# Patient Record
Sex: Male | Born: 1975 | Race: White | Hispanic: No | Marital: Single | Smoking: Current every day smoker
Health system: Southern US, Community
[De-identification: ages and names within clinical notes are randomized; demographics above are authoritative.]

## PROBLEM LIST (undated history)

## (undated) DIAGNOSIS — K759 Inflammatory liver disease, unspecified: Secondary | ICD-10-CM

## (undated) DIAGNOSIS — E119 Type 2 diabetes mellitus without complications: Secondary | ICD-10-CM

## (undated) DIAGNOSIS — R768 Other specified abnormal immunological findings in serum: Secondary | ICD-10-CM

## (undated) DIAGNOSIS — E7800 Pure hypercholesterolemia, unspecified: Secondary | ICD-10-CM

## (undated) DIAGNOSIS — I10 Essential (primary) hypertension: Secondary | ICD-10-CM

## (undated) DIAGNOSIS — K219 Gastro-esophageal reflux disease without esophagitis: Secondary | ICD-10-CM

## (undated) HISTORY — PX: FOOT SURGERY: SHX648

## (undated) HISTORY — DX: Type 2 diabetes mellitus without complications: E11.9

## (undated) HISTORY — PX: NO PAST SURGERIES: SHX2092

## (undated) HISTORY — DX: Other specified abnormal immunological findings in serum: R76.8

## (undated) HISTORY — DX: Essential (primary) hypertension: I10

## (undated) HISTORY — DX: Pure hypercholesterolemia, unspecified: E78.00

---

## 2005-10-22 ENCOUNTER — Emergency Department (HOSPITAL_COMMUNITY): Admission: EM | Admit: 2005-10-22 | Discharge: 2005-10-22 | Payer: Self-pay | Admitting: Emergency Medicine

## 2010-03-17 ENCOUNTER — Emergency Department (HOSPITAL_COMMUNITY)
Admission: EM | Admit: 2010-03-17 | Discharge: 2010-03-17 | Disposition: A | Discharge status: Home / Self Care | Payer: Self-pay | Attending: Emergency Medicine | Admitting: Emergency Medicine

## 2010-03-17 ENCOUNTER — Emergency Department (HOSPITAL_COMMUNITY): Payer: Self-pay

## 2010-03-17 DIAGNOSIS — J209 Acute bronchitis, unspecified: Secondary | ICD-10-CM | POA: Insufficient documentation

## 2010-03-17 DIAGNOSIS — R042 Hemoptysis: Secondary | ICD-10-CM | POA: Insufficient documentation

## 2012-06-03 ENCOUNTER — Emergency Department (HOSPITAL_COMMUNITY)
Admission: EM | Admit: 2012-06-03 | Discharge: 2012-06-03 | Disposition: A | Discharge status: Home / Self Care | Payer: Self-pay | Attending: Emergency Medicine | Admitting: Emergency Medicine

## 2012-06-03 ENCOUNTER — Emergency Department (HOSPITAL_COMMUNITY): Payer: Self-pay

## 2012-06-03 ENCOUNTER — Encounter (HOSPITAL_COMMUNITY): Payer: Self-pay

## 2012-06-03 DIAGNOSIS — Z79899 Other long term (current) drug therapy: Secondary | ICD-10-CM | POA: Insufficient documentation

## 2012-06-03 DIAGNOSIS — R1033 Periumbilical pain: Secondary | ICD-10-CM | POA: Insufficient documentation

## 2012-06-03 DIAGNOSIS — I10 Essential (primary) hypertension: Secondary | ICD-10-CM | POA: Insufficient documentation

## 2012-06-03 DIAGNOSIS — R109 Unspecified abdominal pain: Secondary | ICD-10-CM

## 2012-06-03 DIAGNOSIS — R630 Anorexia: Secondary | ICD-10-CM | POA: Insufficient documentation

## 2012-06-03 DIAGNOSIS — R110 Nausea: Secondary | ICD-10-CM | POA: Insufficient documentation

## 2012-06-03 LAB — CBC WITH DIFFERENTIAL/PLATELET
Basophils Absolute: 0 10*3/uL (ref 0.0–0.1)
Basophils Relative: 0 % (ref 0–1)
HCT: 45.5 % (ref 39.0–52.0)
Hemoglobin: 15.8 g/dL (ref 13.0–17.0)
Lymphocytes Relative: 24 % (ref 12–46)
MCHC: 34.7 g/dL (ref 30.0–36.0)
Monocytes Absolute: 0.6 10*3/uL (ref 0.1–1.0)
Monocytes Relative: 5 % (ref 3–12)
Neutro Abs: 8.2 10*3/uL — ABNORMAL HIGH (ref 1.7–7.7)
Neutrophils Relative %: 69 % (ref 43–77)
RBC: 5.28 MIL/uL (ref 4.22–5.81)
WBC: 11.9 10*3/uL — ABNORMAL HIGH (ref 4.0–10.5)

## 2012-06-03 LAB — URINALYSIS, ROUTINE W REFLEX MICROSCOPIC
Bilirubin Urine: NEGATIVE
Glucose, UA: NEGATIVE mg/dL
Ketones, ur: NEGATIVE mg/dL
Protein, ur: NEGATIVE mg/dL
pH: 8 (ref 5.0–8.0)

## 2012-06-03 LAB — COMPREHENSIVE METABOLIC PANEL
AST: 31 U/L (ref 0–37)
Albumin: 4.2 g/dL (ref 3.5–5.2)
Alkaline Phosphatase: 64 U/L (ref 39–117)
BUN: 13 mg/dL (ref 6–23)
CO2: 27 mEq/L (ref 19–32)
Chloride: 99 mEq/L (ref 96–112)
GFR calc non Af Amer: >90 mL/min (ref >90)
Potassium: 3.6 mEq/L (ref 3.5–5.1)
Total Bilirubin: 0.5 mg/dL (ref 0.3–1.2)

## 2012-06-03 MED ORDER — IOHEXOL 300 MG/ML  SOLN
100.0000 mL | Freq: Once | INTRAMUSCULAR | Status: AC | PRN
  Administered 2012-06-03: 100 mL via INTRAVENOUS

## 2012-06-03 MED ORDER — ONDANSETRON HCL 4 MG/2ML IJ SOLN
4.0000 mg | Freq: Once | INTRAMUSCULAR | Status: AC
  Administered 2012-06-03: 4 mg via INTRAVENOUS
  Filled 2012-06-03: qty 2

## 2012-06-03 MED ORDER — SODIUM CHLORIDE 0.9 % IV BOLUS (SEPSIS)
1000.0000 mL | Freq: Once | INTRAVENOUS | Status: AC
  Administered 2012-06-03: 1000 mL via INTRAVENOUS

## 2012-06-03 MED ORDER — MORPHINE SULFATE 4 MG/ML IJ SOLN
4.0000 mg | Freq: Once | INTRAMUSCULAR | Status: AC
  Administered 2012-06-03: 4 mg via INTRAVENOUS
  Filled 2012-06-03: qty 1

## 2012-06-03 MED ORDER — IOHEXOL 300 MG/ML  SOLN
50.0000 mL | Freq: Once | INTRAMUSCULAR | Status: AC | PRN
  Administered 2012-06-03: 50 mL via ORAL

## 2012-06-03 NOTE — ED Provider Notes (Signed)
History     CSN: [id number]  Arrival date & time 06/03/12  1329   First MD Initiated Contact with Patient 06/03/12 1338      Chief Complaint  Patient presents with  . Abdominal Pain    (Consider location/radiation/quality/duration/timing/severity/associated sxs/prior treatment) HPI Comments: Patient presents with lower abdominal pain that started yesterday. It is periumbilical and radiates to the right lower quadrant. He became worse overnight. It is constant and associated with anorexia nausea. No vomiting. No change in bowel habits. No dysuria hematuria or testicular pain. No back pain. No previous medical history. No previous surgeries. Good By mouth intake and urine output. Nothing makes the pain better. Palpation makes it worse.  The history is provided by the patient.    History reviewed. No pertinent past medical history.  History reviewed. No pertinent past surgical history.  No family history on file.  History  Substance Use Topics  . Smoking status: Current Every Day Smoker  . Smokeless tobacco: Not on file  . Alcohol Use: No      Review of Systems  Constitutional: Positive for activity change and appetite change. Negative for fever.  HENT: Negative for congestion and rhinorrhea.   Respiratory: Negative for cough, chest tightness and shortness of breath.   Cardiovascular: Negative for chest pain.  Gastrointestinal: Positive for nausea and abdominal pain. Negative for vomiting.  Genitourinary: Negative for dysuria, hematuria and testicular pain.  Musculoskeletal: Negative for back pain.  Skin: Negative for rash.  Neurological: Negative for dizziness, weakness and headaches.  A complete 10 system review of systems was obtained and all systems are negative except as noted in the HPI and PMH.    Allergies  K-dur and Tamiflu  Home Medications   Current Outpatient Rx  Name  Route  Sig  Dispense  Refill  . acetaminophen (TYLENOL) 500 MG tablet   Oral   Take  1,000 mg by mouth every 6 (six) hours as needed for pain.         . cetirizine (ZYRTEC) 10 MG tablet   Oral   Take 10 mg by mouth daily.         Marland Kitchen ibuprofen (ADVIL,MOTRIN) 200 MG tablet   Oral   Take 400 mg by mouth every 6 (six) hours as needed for pain.         Marland Kitchen omeprazole (PRILOSEC OTC) 20 MG tablet   Oral   Take 20 mg by mouth daily.           BP 159/111  Pulse 87  Temp(Src) 98.6 F (37 C) (Oral)  Resp 18  Ht 6\' 2"  (1.88 m)  Wt 240 lb (108.863 kg)  BMI 30.8 kg/m2  SpO2 100%  Physical Exam  Constitutional: He is oriented to person, place, and time. He appears well-developed and well-nourished. No distress.  HENT:  Head: Normocephalic and atraumatic.  Mouth/Throat: Oropharynx is clear and moist. No oropharyngeal exudate.  Eyes: Conjunctivae and EOM are normal. Pupils are equal, round, and reactive to light.  Neck: Normal range of motion. Neck supple.  Cardiovascular: Normal rate, regular rhythm and normal heart sounds.   No murmur heard. Pulmonary/Chest: Effort normal and breath sounds normal. No respiratory distress.  Abdominal: Soft. There is tenderness. There is no rebound and no guarding.  periumbilical Right lower quadrant tenderness without guarding or rebound.  Genitourinary:  No testicular tenderness  Musculoskeletal: Normal range of motion. He exhibits no edema and no tenderness.  Neurological: He is alert and oriented to person,  place, and time. No cranial nerve deficit. He exhibits normal muscle tone. Coordination normal.  Skin: Skin is warm.    ED Course  Procedures (including critical care time)  Labs Reviewed  CBC WITH DIFFERENTIAL - Abnormal; Notable for the following:    WBC 11.9 (*)    Neutro Abs 8.2 (*)    All other components within normal limits  COMPREHENSIVE METABOLIC PANEL - Abnormal; Notable for the following:    Glucose, Bld 155 (*)    All other components within normal limits  LIPASE, BLOOD  URINALYSIS, ROUTINE W REFLEX  MICROSCOPIC   Ct Abdomen Pelvis W Contrast  06/03/2012   *RADIOLOGY REPORT*  Clinical Data: Right lower quadrant pain, past history of hernia repair  CT ABDOMEN AND PELVIS WITH CONTRAST  Technique:  Multidetector CT imaging of the abdomen and pelvis was performed following the standard protocol during bolus administration of intravenous contrast. Sagittal and coronal MPR images reconstructed from axial data set.  Contrast: 50mL OMNIPAQUE IOHEXOL 300 MG/ML  SOLN, OMNIPAQUE IOHEXOL 300 MG/ML  SOLN IV  Comparison: None  Findings: Lung bases clear. Mild diffuse fatty infiltration of liver. Liver, spleen, pancreas, kidneys, and adrenal glands otherwise normal. Normal appendix. Stomach and bowel loops normal appearance. No mass, adenopathy, free fluid inflammatory process. Unremarkable bladder and ureters. Tiny umbilical hernia containing fat. No additional hernia or acute bony lesion.  IMPRESSION: No acute intra-abdominal or intrapelvic abnormalities. Tiny umbilical hernia containing fat. Fatty infiltration of liver.   Original Report Authenticated By: Ulyses Southward, M.D.     1. Abdominal pain       MDM  2 day history of lower abdominal pain in the right lower quadrant. Associated with nausea and anorexia.  Obtain labs, CT to rule out appendicitis. Mild leukocytosis.  CT scan shows normal appendix. Tiny umbilical hernia with fat.  UA negative.  Tolerating PO.   Will give list of PCP for referral. Will need hypertension evaluated.      Glynn Octave, MD 06/03/12 [phone]

## 2012-06-03 NOTE — ED Notes (Signed)
Complain of lower abdominal pain . Last BM this morning

## 2012-06-03 NOTE — ED Notes (Signed)
Pt c/o RLQ pain since last night. Pt states pain was worse this morning when he woke up. Pt denies NVD, SOB, chest pain.

## 2014-09-30 ENCOUNTER — Emergency Department (HOSPITAL_COMMUNITY)
Admission: EM | Admit: 2014-09-30 | Discharge: 2014-09-30 | Disposition: A | Discharge status: Home / Self Care | Payer: Self-pay | Attending: Emergency Medicine | Admitting: Emergency Medicine

## 2014-09-30 ENCOUNTER — Emergency Department (HOSPITAL_COMMUNITY): Payer: Self-pay

## 2014-09-30 ENCOUNTER — Encounter (HOSPITAL_COMMUNITY): Payer: Self-pay | Admitting: Emergency Medicine

## 2014-09-30 DIAGNOSIS — Y9289 Other specified places as the place of occurrence of the external cause: Secondary | ICD-10-CM | POA: Insufficient documentation

## 2014-09-30 DIAGNOSIS — R739 Hyperglycemia, unspecified: Secondary | ICD-10-CM

## 2014-09-30 DIAGNOSIS — Z79899 Other long term (current) drug therapy: Secondary | ICD-10-CM | POA: Insufficient documentation

## 2014-09-30 DIAGNOSIS — Y9389 Activity, other specified: Secondary | ICD-10-CM | POA: Insufficient documentation

## 2014-09-30 DIAGNOSIS — Y998 Other external cause status: Secondary | ICD-10-CM | POA: Insufficient documentation

## 2014-09-30 DIAGNOSIS — S39012A Strain of muscle, fascia and tendon of lower back, initial encounter: Secondary | ICD-10-CM | POA: Insufficient documentation

## 2014-09-30 DIAGNOSIS — E1165 Type 2 diabetes mellitus with hyperglycemia: Secondary | ICD-10-CM | POA: Insufficient documentation

## 2014-09-30 DIAGNOSIS — I10 Essential (primary) hypertension: Secondary | ICD-10-CM | POA: Insufficient documentation

## 2014-09-30 DIAGNOSIS — Z720 Tobacco use: Secondary | ICD-10-CM | POA: Insufficient documentation

## 2014-09-30 DIAGNOSIS — S9032XA Contusion of left foot, initial encounter: Secondary | ICD-10-CM | POA: Insufficient documentation

## 2014-09-30 DIAGNOSIS — X58XXXA Exposure to other specified factors, initial encounter: Secondary | ICD-10-CM | POA: Insufficient documentation

## 2014-09-30 DIAGNOSIS — K219 Gastro-esophageal reflux disease without esophagitis: Secondary | ICD-10-CM | POA: Insufficient documentation

## 2014-09-30 DIAGNOSIS — S90122A Contusion of left lesser toe(s) without damage to nail, initial encounter: Secondary | ICD-10-CM

## 2014-09-30 HISTORY — DX: Gastro-esophageal reflux disease without esophagitis: K21.9

## 2014-09-30 LAB — BASIC METABOLIC PANEL
ANION GAP: 10 (ref 5–15)
BUN: 12 mg/dL (ref 6–20)
CO2: 27 mmol/L (ref 22–32)
Calcium: 9 mg/dL (ref 8.9–10.3)
Chloride: 100 mmol/L — ABNORMAL LOW (ref 101–111)
Creatinine, Ser: 1.21 mg/dL (ref 0.61–1.24)
GFR calc Af Amer: >60 mL/min (ref >60)
Glucose, Bld: 402 mg/dL — ABNORMAL HIGH (ref 65–99)
POTASSIUM: 4 mmol/L (ref 3.5–5.1)
SODIUM: 137 mmol/L (ref 135–145)

## 2014-09-30 LAB — CBG MONITORING, ED: GLUCOSE-CAPILLARY: 275 mg/dL — AB (ref 65–99)

## 2014-09-30 MED ORDER — METFORMIN HCL 500 MG PO TABS: 500.0000 mg | ORAL_TABLET | Freq: Two times a day (BID) | ORAL | Status: DC

## 2014-09-30 MED ORDER — AMLODIPINE BESYLATE 5 MG PO TABS
5.0000 mg | ORAL_TABLET | Freq: Once | ORAL | Status: AC
  Administered 2014-09-30: 5 mg via ORAL
  Filled 2014-09-30: qty 1

## 2014-09-30 MED ORDER — AMLODIPINE BESYLATE 10 MG PO TABS: 10.0000 mg | ORAL_TABLET | Freq: Every day | ORAL | Status: DC

## 2014-09-30 MED ORDER — INSULIN ASPART 100 UNIT/ML ~~LOC~~ SOLN
8.0000 [IU] | Freq: Once | SUBCUTANEOUS | Status: AC
  Administered 2014-09-30: 8 [IU] via SUBCUTANEOUS
  Filled 2014-09-30: qty 1

## 2014-09-30 MED ORDER — HYDROCODONE-ACETAMINOPHEN 5-325 MG PO TABS
2.0000 | ORAL_TABLET | Freq: Once | ORAL | Status: AC
  Administered 2014-09-30: 2 via ORAL
  Filled 2014-09-30: qty 2

## 2014-09-30 MED ORDER — HYDROCHLOROTHIAZIDE 25 MG PO TABS: 25.0000 mg | ORAL_TABLET | Freq: Every day | ORAL | Status: DC

## 2014-09-30 MED ORDER — GLIPIZIDE 5 MG PO TABS
5.0000 mg | ORAL_TABLET | Freq: Every day | ORAL | Status: DC
Start: 2014-09-30 — End: 2016-11-27

## 2014-09-30 NOTE — Discharge Instructions (Signed)
It was our pleasure to provide your ER care today - we hope that you feel better.  Drink plenty of water.  Follow diabetic diet.  Take metformin, and glipizide as prescribed.  Follow up with primary care doctor in the coming week - call office Monday to arrange appointment.  For blood pressure, limit salt intake, take blood pressure medications as prescribed, and follow up with primary care doctor in coming week.   You must follow up with primary care doctor in coming week for recheck of blood sugar and blood pressure - if you cannot get in with the primary care doctor next week, go to urgent care for recheck.   For back pain, avoid any heavy lifting more than 20 lbs for the next few days.  Heat to sore area. Avoid bending at waist, or lifting while bent over at waist. Take tylenol/advil as need for pain.   Return to ER if worse, new symptoms, fevers, vomiting, weak/fainting, intractable pain, other concern.      Hyperglycemia Hyperglycemia occurs when the glucose (sugar) in your blood is too high. Hyperglycemia can happen for many reasons, but it most often happens to people who do not know they have diabetes or are not managing their diabetes properly.  CAUSES  Whether you have diabetes or not, there are other causes of hyperglycemia. Hyperglycemia can occur when you have diabetes, but it can also occur in other situations that you might not be as aware of, such as: Diabetes  If you have diabetes and are having problems controlling your blood glucose, hyperglycemia could occur because of some of the following reasons:  Not following your meal plan.  Not taking your diabetes medications or not taking it properly.  Exercising less or doing less activity than you normally do.  Being sick. Pre-diabetes  This cannot be ignored. Before people develop Type 2 diabetes, they almost always have "pre-diabetes." This is when your blood glucose levels are higher than normal, but not yet high  enough to be diagnosed as diabetes. Research has shown that some long-term damage to the body, especially the heart and circulatory system, may already be occurring during pre-diabetes. If you take action to manage your blood glucose when you have pre-diabetes, you may delay or prevent Type 2 diabetes from developing. Stress  If you have diabetes, you may be "diet" controlled or on oral medications or insulin to control your diabetes. However, you may find that your blood glucose is higher than usual in the hospital whether you have diabetes or not. This is often referred to as "stress hyperglycemia." Stress can elevate your blood glucose. This happens because of hormones put out by the body during times of stress. If stress has been the cause of your high blood glucose, it can be followed regularly by your caregiver. That way he/she can make sure your hyperglycemia does not continue to get worse or progress to diabetes. Steroids  Steroids are medications that act on the infection fighting system (immune system) to block inflammation or infection. One side effect can be a rise in blood glucose. Most people can produce enough extra insulin to allow for this rise, but for those who cannot, steroids make blood glucose levels go even higher. It is not unusual for steroid treatments to "uncover" diabetes that is developing. It is not always possible to determine if the hyperglycemia will go away after the steroids are stopped. A special blood test called an A1c is sometimes done to determine if your  blood glucose was elevated before the steroids were started. SYMPTOMS  Thirsty.  Frequent urination.  Dry mouth.  Blurred vision.  Tired or fatigue.  Weakness.  Sleepy.  Tingling in feet or leg. DIAGNOSIS  Diagnosis is made by monitoring blood glucose in one or all of the following ways:  A1c test. This is a chemical found in your blood.  Fingerstick blood glucose monitoring.  Laboratory  results. TREATMENT  First, knowing the cause of the hyperglycemia is important before the hyperglycemia can be treated. Treatment may include, but is not be limited to:  Education.  Change or adjustment in medications.  Change or adjustment in meal plan.  Treatment for an illness, infection, etc.  More frequent blood glucose monitoring.  Change in exercise plan.  Decreasing or stopping steroids.  Lifestyle changes. HOME CARE INSTRUCTIONS   Test your blood glucose as directed.  Exercise regularly. Your caregiver will give you instructions about exercise. Pre-diabetes or diabetes which comes on with stress is helped by exercising.  Eat wholesome, balanced meals. Eat often and at regular, fixed times. Your caregiver or nutritionist will give you a meal plan to guide your sugar intake.  Being at an ideal weight is important. If needed, losing as little as 10 to 15 pounds may help improve blood glucose levels. SEEK MEDICAL CARE IF:   You have questions about medicine, activity, or diet.  You continue to have symptoms (problems such as increased thirst, urination, or weight gain). SEEK IMMEDIATE MEDICAL CARE IF:   You are vomiting or have diarrhea.  Your breath smells fruity.  You are breathing faster or slower.  You are very sleepy or incoherent.  You have numbness, tingling, or pain in your feet or hands.  You have chest pain.  Your symptoms get worse even though you have been following your caregiver's orders.  If you have any other questions or concerns. Document Released: 06/26/2000 Document Revised: 03/25/2011 Document Reviewed: 04/29/2011 [facility] Patient Information 2015 [place], [place]. This information is not intended to replace advice given to you by your health care provider. Make sure you discuss any questions you have with your health care provider.   Type 2 Diabetes Mellitus Type 2 diabetes mellitus, often simply referred to as type 2 diabetes, is a  long-lasting (chronic) disease. In type 2 diabetes, the pancreas does not make enough insulin (a hormone), the cells are less responsive to the insulin that is made (insulin resistance), or both. Normally, insulin moves sugars from food into the tissue cells. The tissue cells use the sugars for energy. The lack of insulin or the lack of normal response to insulin causes excess sugars to build up in the blood instead of going into the tissue cells. As a result, high blood sugar (hyperglycemia) develops. The effect of high sugar (glucose) levels can cause many complications. Type 2 diabetes was also previously called adult-onset diabetes, but it can occur at any age.  RISK FACTORS  A person is predisposed to developing type 2 diabetes if someone in the family has the disease and also has one or more of the following primary risk factors:  Overweight.  An inactive lifestyle.  A history of consistently eating high-calorie foods. Maintaining a normal weight and regular physical activity can reduce the chance of developing type 2 diabetes. SYMPTOMS  A person with type 2 diabetes may not show symptoms initially. The symptoms of type 2 diabetes appear slowly. The symptoms include:  Increased thirst (polydipsia).  Increased urination (polyuria).  Increased  urination during the night (nocturia).  Weight loss. This weight loss may be rapid.  Frequent, recurring infections.  Tiredness (fatigue).  Weakness.  Vision changes, such as blurred vision.  Fruity smell to your breath.  Abdominal pain.  Nausea or vomiting.  Cuts or bruises which are slow to heal.  Tingling or numbness in the hands or feet. DIAGNOSIS Type 2 diabetes is frequently not diagnosed until complications of diabetes are present. Type 2 diabetes is diagnosed when symptoms or complications are present and when blood glucose levels are increased. Your blood glucose level may be checked by one or more of the following blood  tests:  A fasting blood glucose test. You will not be allowed to eat for at least 8 hours before a blood sample is taken.  A random blood glucose test. Your blood glucose is checked at any time of the day regardless of when you ate.  A hemoglobin A1c blood glucose test. A hemoglobin A1c test provides information about blood glucose control over the previous 3 months.  An oral glucose tolerance test (OGTT). Your blood glucose is measured after you have not eaten (fasted) for 2 hours and then after you drink a glucose-containing beverage. TREATMENT   You may need to take insulin or diabetes medicine daily to keep blood glucose levels in the desired range.  If you use insulin, you may need to adjust the dosage depending on the carbohydrates that you eat with each meal or snack. The treatment goal is to maintain the before meal blood sugar (preprandial glucose) level at 70-130 mg/dL. HOME CARE INSTRUCTIONS   Have your hemoglobin A1c level checked twice a year.  Perform daily blood glucose monitoring as directed by your health care provider.  Monitor urine ketones when you are ill and as directed by your health care provider.  Take your diabetes medicine or insulin as directed by your health care provider to maintain your blood glucose levels in the desired range.  Never run out of diabetes medicine or insulin. It is needed every day.  If you are using insulin, you may need to adjust the amount of insulin given based on your intake of carbohydrates. Carbohydrates can raise blood glucose levels but need to be included in your diet. Carbohydrates provide vitamins, minerals, and fiber which are an essential part of a healthy diet. Carbohydrates are found in fruits, vegetables, whole grains, dairy products, legumes, and foods containing added sugars.  Eat healthy foods. You should make an appointment to see a registered dietitian to help you create an eating plan that is right for you.  Lose  weight if you are overweight.  Carry a medical alert card or wear your medical alert jewelry.  Carry a 15-gram carbohydrate snack with you at all times to treat low blood glucose (hypoglycemia). Some examples of 15-gram carbohydrate snacks include:  Glucose tablets, 3 or 4.  Glucose gel, 15-gram tube.  Raisins, 2 tablespoons (24 grams).  Jelly beans, 6.  Animal crackers, 8.  Regular pop, 4 ounces (120 mL).  Gummy treats, 9.  Recognize hypoglycemia. Hypoglycemia occurs with blood glucose levels of 70 mg/dL and below. The risk for hypoglycemia increases when fasting or skipping meals, during or after intense exercise, and during sleep. Hypoglycemia symptoms can include:  Tremors or shakes.  Decreased ability to concentrate.  Sweating.  Increased heart rate.  Headache.  Dry mouth.  Hunger.  Irritability.  Anxiety.  Restless sleep.  Altered speech or coordination.  Confusion.  Treat hypoglycemia promptly.  If you are alert and able to safely swallow, follow the 15:15 rule:  Take 15-20 grams of rapid-acting glucose or carbohydrate. Rapid-acting options include glucose gel, glucose tablets, or 4 ounces (120 mL) of fruit juice, regular soda, or low-fat milk.  Check your blood glucose level 15 minutes after taking the glucose.  Take 15-20 grams more of glucose if the repeat blood glucose level is still 70 mg/dL or below.  Eat a meal or snack within 1 hour once blood glucose levels return to normal.  Be alert to feeling very thirsty and urinating more frequently than usual, which are early signs of hyperglycemia. An early awareness of hyperglycemia allows for prompt treatment. Treat hyperglycemia as directed by your health care provider.  Engage in at least 150 minutes of moderate-intensity physical activity a week, spread over at least 3 days of the week or as directed by your health care provider. In addition, you should engage in resistance exercise at least 2  times a week or as directed by your health care provider. Try to spend no more than 90 minutes at one time inactive.  Adjust your medicine and food intake as needed if you start a new exercise or sport.  Follow your sick-day plan anytime you are unable to eat or drink as usual.  Do not use any tobacco products including cigarettes, chewing tobacco, or electronic cigarettes. If you need help quitting, ask your health care provider.  Limit alcohol intake to no more than 1 drink per day for nonpregnant women and 2 drinks per day for men. You should drink alcohol only when you are also eating food. Talk with your health care provider whether alcohol is safe for you. Tell your health care provider if you drink alcohol several times a week.  Keep all follow-up visits as directed by your health care provider. This is important.  Schedule an eye exam soon after the diagnosis of type 2 diabetes and then annually.  Perform daily skin and foot care. Examine your skin and feet daily for cuts, bruises, redness, nail problems, bleeding, blisters, or sores. A foot exam by a health care provider should be done annually.  Brush your teeth and gums at least twice a day and floss at least once a day. Follow up with your dentist regularly.  Share your diabetes management plan with your workplace or school.  Stay up-to-date with immunizations. It is recommended that people with diabetes who are over 43 years old get the pneumonia vaccine. In some cases, two separate shots may be given. Ask your health care provider if your pneumonia vaccination is up-to-date.  Learn to manage stress.  Obtain ongoing diabetes education and support as needed.  Participate in or seek rehabilitation as needed to maintain or improve independence and quality of life. Request a physical or occupational therapy referral if you are having foot or hand numbness, or difficulties with grooming, dressing, eating, or physical activity. SEEK  MEDICAL CARE IF:   You are unable to eat food or drink fluids for more than 6 hours.  You have nausea and vomiting for more than 6 hours.  Your blood glucose level is over 240 mg/dL.  There is a change in mental status.  You develop an additional serious illness.  You have diarrhea for more than 6 hours.  You have been sick or have had a fever for a couple of days and are not getting better.  You have pain during any physical activity.  SEEK IMMEDIATE MEDICAL  CARE IF:  You have difficulty breathing.  You have moderate to large ketone levels. MAKE SURE YOU:  Understand these instructions.  Will watch your condition.  Will get help right away if you are not doing well or get worse. Document Released: 12/31/2004 Document Revised: 05/17/2013 Document Reviewed: 07/30/2011 [facility] Patient Information 2015 [place], [place]. This information is not intended to replace advice given to you by your health care provider. Make sure you discuss any questions you have with your health care provider.  Diabetes Mellitus and Food It is important for you to manage your blood sugar (glucose) level. Your blood glucose level can be greatly affected by what you eat. Eating healthier foods in the appropriate amounts throughout the day at about the same time each day will help you control your blood glucose level. It can also help slow or prevent worsening of your diabetes mellitus. Healthy eating may even help you improve the level of your blood pressure and reach or maintain a healthy weight.  HOW CAN FOOD AFFECT ME? Carbohydrates Carbohydrates affect your blood glucose level more than any other type of food. Your dietitian will help you determine how many carbohydrates to eat at each meal and teach you how to count carbohydrates. Counting carbohydrates is important to keep your blood glucose at a healthy level, especially if you are using insulin or taking certain medicines for diabetes  mellitus. Alcohol Alcohol can cause sudden decreases in blood glucose (hypoglycemia), especially if you use insulin or take certain medicines for diabetes mellitus. Hypoglycemia can be a life-threatening condition. Symptoms of hypoglycemia (sleepiness, dizziness, and disorientation) are similar to symptoms of having too much alcohol.  If your health care provider has given you approval to drink alcohol, do so in moderation and use the following guidelines:  Women should not have more than one drink per day, and men should not have more than two drinks per day. One drink is equal to:  12 oz of beer.  5 oz of wine.  1 oz of hard liquor.  Do not drink on an empty stomach.  Keep yourself hydrated. Have water, diet soda, or unsweetened iced tea.  Regular soda, juice, and other mixers might contain a lot of carbohydrates and should be counted. WHAT FOODS ARE NOT RECOMMENDED? As you make food choices, it is important to remember that all foods are not the same. Some foods have fewer nutrients per serving than other foods, even though they might have the same number of calories or carbohydrates. It is difficult to get your body what it needs when you eat foods with fewer nutrients. Examples of foods that you should avoid that are high in calories and carbohydrates but low in nutrients include:  Trans fats (most processed foods list trans fats on the Nutrition Facts label).  Regular soda.  Juice.  Candy.  Sweets, such as cake, pie, doughnuts, and cookies.  Fried foods. WHAT FOODS CAN I EAT? Have nutrient-rich foods, which will nourish your body and keep you healthy. The food you should eat also will depend on several factors, including:  The calories you need.  The medicines you take.  Your weight.  Your blood glucose level.  Your blood pressure level.  Your cholesterol level. You also should eat a variety of foods, including:  Protein, such as meat, poultry, fish, tofu, nuts,  and seeds (lean animal proteins are best).  Fruits.  Vegetables.  Dairy products, such as milk, cheese, and yogurt (low fat is best).  Breads,  grains, pasta, cereal, rice, and beans.  Fats such as olive oil, trans fat-free margarine, canola oil, avocado, and olives. DOES EVERYONE WITH DIABETES MELLITUS HAVE THE SAME MEAL PLAN? Because every person with diabetes mellitus is different, there is not one meal plan that works for everyone. It is very important that you meet with a dietitian who will help you create a meal plan that is just right for you. Document Released: 09/27/2004 Document Revised: 01/05/2013 Document Reviewed: 11/27/2012 [facility] Patient Information 2015 [place], [place]. This information is not intended to replace advice given to you by your health care provider. Make sure you discuss any questions you have with your health care provider.   Back Pain, Adult Low back pain is very common. About 1 in 5 people have back pain.The cause of low back pain is rarely dangerous. The pain often gets better over time.About half of people with a sudden onset of back pain feel better in just 2 weeks. About 8 in 10 people feel better by 6 weeks.  CAUSES Some common causes of back pain include:  Strain of the muscles or ligaments supporting the spine.  Wear and tear (degeneration) of the spinal discs.  Arthritis.  Direct injury to the back. DIAGNOSIS Most of the time, the direct cause of low back pain is not known.However, back pain can be treated effectively even when the exact cause of the pain is unknown.Answering your caregiver's questions about your overall health and symptoms is one of the most accurate ways to make sure the cause of your pain is not dangerous. If your caregiver needs more information, he or she may order lab work or imaging tests (X-rays or MRIs).However, even if imaging tests show changes in your back, this usually does not require surgery. HOME CARE  INSTRUCTIONS For many people, back pain returns.Since low back pain is rarely dangerous, it is often a condition that people can learn to [facility] their own.   Remain active. It is stressful on the back to sit or stand in one place. Do not sit, drive, or stand in one place for more than 30 minutes at a time. Take short walks on level surfaces as soon as pain allows.Try to increase the length of time you walk each day.  Do not stay in bed.Resting more than 1 or 2 days can delay your recovery.  Do not avoid exercise or work.Your body is made to move.It is not dangerous to be active, even though your back may hurt.Your back will likely heal faster if you return to being active before your pain is gone.  Pay attention to your body when you bend and lift. Many people have less discomfortwhen lifting if they bend their knees, keep the load close to their bodies,and avoid twisting. Often, the most comfortable positions are those that put less stress on your recovering back.  Find a comfortable position to sleep. Use a firm mattress and lie on your side with your knees slightly bent. If you lie on your back, put a pillow under your knees.  Only take over-the-counter or prescription medicines as directed by your caregiver. Over-the-counter medicines to reduce pain and inflammation are often the most helpful.Your caregiver may prescribe muscle relaxant drugs.These medicines help dull your pain so you can more quickly return to your normal activities and healthy exercise.  Put ice on the injured area.  Put ice in a plastic bag.  Place a towel between your skin and the bag.  Leave the ice on  for 15-20 minutes, 03-04 times a day for the first 2 to 3 days. After that, ice and heat may be alternated to reduce pain and spasms.  Ask your caregiver about trying back exercises and gentle massage. This may be of some benefit.  Avoid feeling anxious or stressed.Stress increases muscle tension and  can worsen back pain.It is important to recognize when you are anxious or stressed and learn ways to manage it.Exercise is a great option. SEEK MEDICAL CARE IF:  You have pain that is not relieved with rest or medicine.  You have pain that does not improve in 1 week.  You have new symptoms.  You are generally not feeling well. SEEK IMMEDIATE MEDICAL CARE IF:   You have pain that radiates from your back into your legs.  You develop new bowel or bladder control problems.  You have unusual weakness or numbness in your arms or legs.  You develop nausea or vomiting.  You develop abdominal pain.  You feel faint. Document Released: 12/31/2004 Document Revised: 07/02/2011 Document Reviewed: 05/04/2013 [facility] Patient Information 2015 [place], [place]. This information is not intended to replace advice given to you by your health care provider. Make sure you discuss any questions you have with your health care provider.   Heat Therapy Heat therapy can help ease sore, stiff, injured, and tight muscles and joints. Heat relaxes your muscles, which may help ease your pain.  RISKS AND COMPLICATIONS If you have any of the following conditions, do not use heat therapy unless your health care provider has approved:  Poor circulation.  Healing wounds or scarred skin in the area being treated.  Diabetes, heart disease, or high blood pressure.  Not being able to feel (numbness) the area being treated.  Unusual swelling of the area being treated.  Active infections.  Blood clots.  Cancer.  Inability to communicate pain. This may include young children and people who have problems with their brain function (dementia).  Pregnancy. Heat therapy should only be used on old, pre-existing, or long-lasting (chronic) injuries. Do not use heat therapy on new injuries unless directed by your health care provider. HOW TO USE HEAT THERAPY There are several different kinds of heat therapy,  including:  Moist heat pack.  Warm water bath.  Hot water bottle.  Electric heating pad.  Heated gel pack.  Heated wrap.  Electric heating pad. Use the heat therapy method suggested by your health care provider. Follow your health care provider's instructions on when and how to use heat therapy. GENERAL HEAT THERAPY RECOMMENDATIONS  Do not sleep while using heat therapy. Only use heat therapy while you are awake.  Your skin may turn pink while using heat therapy. Do not use heat therapy if your skin turns red.  Do not use heat therapy if you have new pain.  High heat or long exposure to heat can cause burns. Be careful when using heat therapy to avoid burning your skin.  Do not use heat therapy on areas of your skin that are already irritated, such as with a rash or sunburn. SEEK MEDICAL CARE IF:  You have blisters, redness, swelling, or numbness.  You have new pain.  Your pain is worse. MAKE SURE YOU:  Understand these instructions.  Will watch your condition.  Will get help right away if you are not doing well or get worse. Document Released: 03/25/2011 Document Revised: 05/17/2013 Document Reviewed: 02/23/2013 [facility] Patient Information 2015 [place], [place]. This information is not intended to replace advice  given to you by your health care provider. Make sure you discuss any questions you have with your health care provider.    Hypertension Hypertension, commonly called high blood pressure, is when the force of blood pumping through your arteries is too strong. Your arteries are the blood vessels that carry blood from your heart throughout your body. A blood pressure reading consists of a higher number over a lower number, such as 110/72. The higher number (systolic) is the pressure inside your arteries when your heart pumps. The lower number (diastolic) is the pressure inside your arteries when your heart relaxes. Ideally you want your blood pressure below  120/80. Hypertension forces your heart to work harder to pump blood. Your arteries may become narrow or stiff. Having hypertension puts you at risk for heart disease, stroke, and other problems.  RISK FACTORS Some risk factors for high blood pressure are controllable. Others are not.  Risk factors you cannot control include:   Race. You may be at higher risk if you are African American.  Age. Risk increases with age.  Gender. Men are at higher risk than women before age 50 years. After age 21, women are at higher risk than men. Risk factors you can control include:  Not getting enough exercise or physical activity.  Being overweight.  Getting too much fat, sugar, calories, or salt in your diet.  Drinking too much alcohol. SIGNS AND SYMPTOMS Hypertension does not usually cause signs or symptoms. Extremely high blood pressure (hypertensive crisis) may cause headache, anxiety, shortness of breath, and nosebleed. DIAGNOSIS  To check if you have hypertension, your health care provider will measure your blood pressure while you are seated, with your arm held at the level of your heart. It should be measured at least twice using the same arm. Certain conditions can cause a difference in blood pressure between your right and left arms. A blood pressure reading that is higher than normal on one occasion does not mean that you need treatment. If one blood pressure reading is high, ask your health care provider about having it checked again. TREATMENT  Treating high blood pressure includes making lifestyle changes and possibly taking medicine. Living a healthy lifestyle can help lower high blood pressure. You may need to change some of your habits. Lifestyle changes may include:  Following the DASH diet. This diet is high in fruits, vegetables, and whole grains. It is low in salt, red meat, and added sugars.  Getting at least 2 hours of brisk physical activity every week.  Losing weight if  necessary.  Not smoking.  Limiting alcoholic beverages.  Learning ways to reduce stress. If lifestyle changes are not enough to get your blood pressure under control, your health care provider may prescribe medicine. You may need to take more than one. Work closely with your health care provider to understand the risks and benefits. HOME CARE INSTRUCTIONS  Have your blood pressure rechecked as directed by your health care provider.   Take medicines only as directed by your health care provider. Follow the directions carefully. Blood pressure medicines must be taken as prescribed. The medicine does not work as well when you skip doses. Skipping doses also puts you at risk for problems.   Do not smoke.   Monitor your blood pressure at home as directed by your health care provider. SEEK MEDICAL CARE IF:   You think you are having a reaction to medicines taken.  You have recurrent headaches or feel dizzy.  You  have swelling in your ankles.  You have trouble with your vision. SEEK IMMEDIATE MEDICAL CARE IF:  You develop a severe headache or confusion.  You have unusual weakness, numbness, or feel faint.  You have severe chest or abdominal pain.  You vomit repeatedly.  You have trouble breathing. MAKE SURE YOU:   Understand these instructions.  Will watch your condition.  Will get help right away if you are not doing well or get worse. Document Released: 12/31/2004 Document Revised: 05/17/2013 Document Reviewed: 10/23/2012 [facility] Patient Information 2015 [place], [place]. This information is not intended to replace advice given to you by your health care provider. Make sure you discuss any questions you have with your health care provider.     Emergency Department Resource Guide 1) Find a Doctor and Pay Out of Pocket Although you won't have to find out who is covered by your insurance plan, it is a good idea to ask around and get recommendations. You will then need  to call the office and see if the doctor you have chosen will accept you as a new patient and what types of options they offer for patients who are self-pay. Some doctors offer discounts or will set up payment plans for their patients who do not have insurance, but you will need to ask so you aren't surprised when you get to your appointment.  2) Contact Your Local Health Department Not all health departments have doctors that can see patients for sick visits, but many do, so it is worth a call to see if yours does. If you don't know where your local health department is, you can check in your phone book. The CDC also has a tool to help you locate your state's health department, and many state websites also have listings of all of their local health departments.  3) Find a Walk-in Clinic If your illness is not likely to be very severe or complicated, you may want to try a walk in clinic. These are popping up all over the country in pharmacies, drugstores, and shopping centers. They're usually staffed by nurse practitioners or physician assistants that have been trained to treat common illnesses and complaints. They're usually fairly quick and inexpensive. However, if you have serious medical issues or chronic medical problems, these are probably not your best option.  No Primary Care Doctor: - Call Health Connect at  [phone] - they can help you locate a primary care doctor that  accepts your insurance, provides certain services, etc. - Physician Referral Service- [phone]  Chronic Pain Problems: Organization         Address  Phone   Notes  [facility]  [phone] Patients need to be referred by their primary care doctor.   Medication Assistance: Organization         Address  Phone   Notes  [facility] Medication [facility] [address] [place] [place], [place] [address] [phone] --Must be a resident of [facility] -- Must have NO insurance  coverage whatsoever (no Medicaid/ Medicare, etc.) -- The pt. MUST have a primary care doctor that directs their care regularly and follows them in the community   [facility]ist  [phone]   [employer]  [phone]    Agencies that provide inexpensive medical care: Organization         Address  Phone   Notes  [facility]  [phone]   [facility]    (  [phone]) [phone]   [facility] [address] [place], [place] [address] [phone]) [phone]   [facility] [address], [place] [phone](336) [phone]   Planned Parenthood    [phone]) [phone]   [facility]    [phone]) [phone]   [facility]  [address], [place] Phone:  [phone](336) [phone], Fax:  [phone] Hours of Operation:  9 am - 6 pm, M-F.  Also accepts Medicaid/Medicare and self-pay.  [facility]  [address], [place] Phone: [phone]) [phone], Fax: [phone](336) [phone]. Hours of Operation:  8:30 am - 5:30 pm, M-F.  Also accepts Medicaid and self-pay.  [facility] [place] [address], [place] Phone: [phone](336) [phone]   [facility] [address], [place], [place] [phone], Ext. [phone] Mondays & Thursdays: 7-9 AM.  First 15 patients are seen on a first come, first serve basis.    Medicaid-accepting [facility] Providers:  Organization         Address  Phone   Notes  [facility] [address], [place] [phone]) [phone] Also accepts self-pay patients.  [facility] [address] [place], [place]  [phone]) [phone]   [facility] [address], [place] [phone]) [phone]   [facility] [address] [place] [address], [place] [phone](336) [phone]   Renaye RakersVeita Bland [address], [place]   [phone]) [phone] Only accepts [place] Access [place] patients after they have their  name applied to their card.   Self-Pay (no insurance) in [facility]:  Organization         Address  Phone   Notes  Sickle Cell Patients, [facility] [address], [place] [phone]) [phone]   [facility] [address], [place] [phone]) [phone]   [facility] [place]  [address], [place] [phone]) [phone]   [facility]/Dr. Osei-Bonsu  [address] [place] [address], [place] or [phone], [place] [phone](336) [phone] Phone number for both [place] and [place] locations is the same.  [facility] [address], [place] [phone](336) [phone]   [facility] [address] [place] [address], [place] or [address] [phone](336) [phone](336) [phone]   [facility] [address] [place] [address], [place] [phone](336) [phone], phone; [phone]) [phone], fax Sees patients 1st and 3rd Saturday of every month.  Must not qualify for public or private insurance (i.e. Medicaid, Medicare, [place] Health Choice, Veterans' Benefits)  Household income should be no more than 200% of the poverty level The clinic cannot treat you if you are pregnant or think you are pregnant  Sexually transmitted diseases are not treated at the clinic.    Dental Care: Organization         Address  Phone  Notes  [facility] of [facility] [address], [place] [phone]) [phone] Accepts children up to age 39 who are enrolled in [place] or [place] Health Choice; pregnant women with a Medicaid card; and children who have applied for Medicaid or [place] Health Choice, but were declined, whose parents can pay a reduced fee at time of service.  [facility] [place]  [address], [place] [phone](336) [phone] Accepts children up to  age 51 who are enrolled in Medicaid or [place] Health Choice; pregnant women with a Medicaid card; and children who have applied for  Medicaid or [place] Health Choice, but were declined, whose parents can pay a reduced fee at time of service.  [facility]  [address] [place], [place] [phone] Patients are seen by appointment only. Walk-ins are not accepted. [facility] will see patients 12 years of age and older. Monday - Tuesday (8am-5pm) Most Wednesdays (8:30-5pm) $30 per visit, cash only  [facility]  [address], [facility] [phone] Patients are seen by appointment only. Walk-ins are not accepted. [facility] will see patients 21 years of age and older. One Wednesday Evening (Monthly: Volunteer Based).  $30 per visit, cash only  [employer]  [phone] for adults; Children under age 13, call Graduate Pediatric Dentistry at [phone]. Children aged 7-14, please call [phone] to request a pediatric application.  Dental services are provided in all areas of dental care including fillings, crowns and bridges, complete and partial dentures, implants, gum treatment, root canals, and extractions. Preventive care is also provided. Treatment is provided to both adults and children. Patients are selected via a lottery and there is often a waiting list.   [facility] [address], [place]  [phone] [email or web address]   [facility] [address] [place], [place] [phone], Ext. [phone] Second and Fourth Thursday of each month, opens at 6:30 AM; Clinic ends at 9 AM.  Patients are seen on a first-come first-served basis, and a limited number are seen during each clinic.   [facility]  [address] [place] [place], [place] [phone]   Eligibility Requirements You must have lived in [place], [place], or [place] counties for at least the last three months.   You cannot be eligible for state or federal sponsored [place], including [employer], [place],  or [employer].   You generally cannot be eligible for healthcare insurance through your employer.    How to apply: Eligibility screenings are held every Tuesday and Wednesday afternoon from 1:00 pm until 4:00 pm. You do not need an appointment for the interview!  [facility] [address] [place], [place] [phone]   [facility] Health Department  [phone]   [facility] Health Department  [phone]   [facility] Health Department  [phone]    Behavioral Health Resources in the Community: Intensive Outpatient Programs Organization         Address  Phone  Notes  [facility] Services [address], [place], [place] [phone]   [facility] Outpatient [address], [place], [place] [phone]   [facility] [address], [place], [place]  [phone]   [facility] Mental Health [address],  [place], [place] [phone] or [phone]   Substance Abuse Resources Organization         Address  Phone  Notes  Al[facility] Drug Services  [phone]   [facility]  [phone]   [facility] [place]  [phone]   Floydene Flock  [phone]   Residential & Outpatient Substance Abuse Program  [phone]   Psychological Services Organization         Address  Phone  Notes  [facility] Behavioral Health  [phone]   [facility] Services  [phone]   [facility] Mental Health [phone]  [address], [place] [phone] or [phone]    Mobile Crisis Teams Organization         Address  Phone  Notes  [facility], Mobile Crisis Care Unit  [phone]   [facility]  [address] [place], [place] [phone]   Doristine Locks [address] [place] [phone]    Self-Help/Support Groups Organization         Address  Phone             Notes  [facility] [place] - variety of support groups   336- [phone] Call for more information  Narcotics Anonymous (NA), [facility] [address], [employer] [place]  2 meetings at this location   Statistician         Address  Phone  Notes  [facility] [address],    [place]  [phone]   [facility]  [address], [place] [address], [place], [place] [phone]   [facility] [address] [place], [place] [phone] Admissions: 8am-3pm M-F  [facility] [address],    [place], [place] [phone]   [facility] [address] [place], [place], [place] [phone]   [facility] [address],  [place], [place] [phone]   [facility] - Intensive Outpatient [address], [place], [place] [phone]   [facility] ([facility] [address]  [place], [place] [phone] or [phone]   [facility] ([facility]) [address], [place], [place] [phone] Accepts Medicaid  [facility] [address],  [place] [phone] Substance Abuse/Addiction Treatment   [facility] Organization         Address  Phone  Notes  [facility]  [phone]   Angie Fava, PhD [address] Kn[address] [place], [place]   [phone] or [phone]   [facility] Behavioral   [address] [place], [place] [phone]   [facility] [address], [place], [place] [phone] Insurance/Medicaid/sponsorship through [facility] and Families [address]                                    [place], [place] [phone] Therapy/tele-psych/case  [facility] [address] [place], [place] [phone]    Dr. Lolly Mustache  [phone]   [facility]  [employer] [facility] Dept. 1) [address] [place] 2) [address] [place] 3)  [address], [place] [phone]  [phone]   [facility] Child Abuse Hotline 478-691-5889 or [phone] (After Hours)

## 2014-09-30 NOTE — ED Provider Notes (Signed)
CSN: [id number]     Arrival date & time 09/30/14  1615 History   First MD Initiated Contact with Patient 09/30/14 1632     Chief Complaint  Patient presents with  . Back Pain     (Consider location/radiation/quality/duration/timing/severity/associated sxs/prior Treatment) Patient is a 39 y.o. male presenting with back pain. The history is provided by the patient.  Back Pain Associated symptoms: no abdominal pain, no chest pain, no fever, no headaches, no numbness and no weakness   Patient presents w multiple issues/complaints.  C/o low back injury/strain yesterday at home. Was reaching down, into a hole, to lift a 5 galloon bucket/water out of.  Felt pull/strain to lower back. Constant low back pain since. Dull. Moderate-severe. No radiation. No radicular pain. Denies any numbness or weakness. No ur retention or incontinence. Denies hx ddd. No prior back surgery. Also c/o contusion/pain to left small toe 2 days ago, persistent pain to area. Skin intact. Also notes bp high. Denies headache. No change in vision. No cp or sob. No edema. Hx same, but states never treated for htn, as he hasnt been to doctor in years.      Past Medical History  Diagnosis Date  . Acid reflux    History reviewed. No pertinent past surgical history. History reviewed. No pertinent family history. Social History  Substance Use Topics  . Smoking status: Current Every Day Smoker  . Smokeless tobacco: None  . Alcohol Use: No    Review of Systems  Constitutional: Negative for fever and chills.  HENT: Negative for sore throat.   Eyes: Negative for visual disturbance.  Respiratory: Negative for shortness of breath.   Cardiovascular: Negative for chest pain and leg swelling.  Gastrointestinal: Negative for vomiting, abdominal pain and diarrhea.  Genitourinary: Negative for flank pain.  Musculoskeletal: Positive for back pain. Negative for neck pain.  Skin: Negative for rash.  Neurological: Negative for  weakness, numbness and headaches.  Hematological: Does not bruise/bleed easily.  Psychiatric/Behavioral: Negative for confusion.      Allergies  K-dur and Tamiflu  Home Medications   Prior to Admission medications   Medication Sig Start Date End Date Taking? Authorizing Provider  acetaminophen (TYLENOL) 500 MG tablet Take 1,000 mg by mouth every 6 (six) hours as needed for pain.    Historical Provider, MD  cetirizine (ZYRTEC) 10 MG tablet Take 10 mg by mouth daily.    Historical Provider, MD  ibuprofen (ADVIL,MOTRIN) 200 MG tablet Take 400 mg by mouth every 6 (six) hours as needed for pain.    Historical Provider, MD  omeprazole (PRILOSEC OTC) 20 MG tablet Take 20 mg by mouth daily.    Historical Provider, MD   BP 167/133 mmHg  Pulse 106  Temp(Src) 97.5 F (36.4 C) (Oral)  Resp 20  Ht 6\' 2"  (1.88 m)  Wt 220 lb (99.791 kg)  BMI 28.23 kg/m2  SpO2 99% Physical Exam  Constitutional: He is oriented to person, place, and time. He appears well-developed and well-nourished. No distress.  HENT:  Head: Atraumatic.  Mouth/Throat: Oropharynx is clear and moist.  Eyes: Conjunctivae are normal. Pupils are equal, round, and reactive to light. No scleral icterus.  Neck: Neck supple. No tracheal deviation present. No thyromegaly present.  Cardiovascular: Normal rate, regular rhythm, normal heart sounds and intact distal pulses.   No murmur heard. Pulmonary/Chest: Effort normal and breath sounds normal. No accessory muscle usage. No respiratory distress.  Abdominal: Soft. He exhibits no distension. There is no tenderness.  Musculoskeletal:  Normal range of motion. He exhibits no edema.  Bruising and tenderness to proximal/base left small toe. Normal cap refill distally. Skin intact.  Lumbar muscular tenderness. tls spine non tender, aligned.   Neurological: He is alert and oriented to person, place, and time.  Motor intact bilat lower ext, stre 5/5, sens grossly intact. Steady gait.   Skin:  Skin is warm and dry. He is not diaphoretic.  Psychiatric: He has a normal mood and affect.  Nursing note and vitals reviewed.   ED Course  Procedures (including critical care time) Labs Review  Results for orders placed or performed during the hospital encounter of 09/30/14  Basic metabolic panel  Result Value Ref Range   Sodium 137 135 - 145 mmol/L   Potassium 4.0 3.5 - 5.1 mmol/L   Chloride 100 (L) 101 - 111 mmol/L   CO2 27 22 - 32 mmol/L   Glucose, Bld 402 (H) 65 - 99 mg/dL   BUN 12 6 - 20 mg/dL   Creatinine, Ser 1.61 0.61 - 1.24 mg/dL   Calcium 9.0 8.9 - 09.6 mg/dL   GFR calc non Af Amer >60 >60 mL/min   GFR calc Af Amer >60 >60 mL/min   Anion gap 10 5 - 15   Dg Toe 5th Left  09/30/2014   CLINICAL DATA:  Injured left fifth toe.  Pain and bruising.  EXAM: DG TOE 5TH LEFT  COMPARISON:  None.  FINDINGS: The joint spaces are maintained.  No acute fracture is identified.  IMPRESSION: No acute bony findings.   Electronically Signed   By: Rudie Meyer M.D.   On: 09/30/2014 17:33     I have personally reviewed and evaluated these images and lab results as part of my medical decision-making.   MDM   Recheck bp remains high.  Reviewed ED visit from a couple yrs prior, bp similarly high then.  Will rx.  Pt states has ride, does not have to drive. No meds pta/today. Confirmed allergies limited to k and tamiflu w pt.    Hydrocodone po.  Xray.  Reviewed nursing notes and prior charts for additional history.   Amlodipine po.  Renal fxn normal.    Glucose elev. No nv. hco3 normal.  Tolerating water/po fluids well. novolog sq.  Will start on diabetic diet, info provided, and metformin.   Discussed importance close pcp f/u with pt, for both glucose/dm and htn.  Recheck bp mildly improved 160/105.   Pt states feels ready for d/c.       Cathren Laine, MD 09/30/14 [phone]

## 2014-09-30 NOTE — ED Notes (Signed)
cbg of 275.

## 2014-09-30 NOTE — ED Notes (Signed)
Patient complaining of back pain after lifting a heavy bucket yesterday. Patient's B/P 200/115 at triage. Patient states he has not been treated for HTN in past.

## 2016-10-29 DIAGNOSIS — E1140 Type 2 diabetes mellitus with diabetic neuropathy, unspecified: Secondary | ICD-10-CM | POA: Insufficient documentation

## 2016-10-29 DIAGNOSIS — K219 Gastro-esophageal reflux disease without esophagitis: Secondary | ICD-10-CM | POA: Insufficient documentation

## 2016-11-07 ENCOUNTER — Encounter (INDEPENDENT_AMBULATORY_CARE_PROVIDER_SITE_OTHER): Payer: Self-pay | Admitting: Internal Medicine

## 2016-11-07 ENCOUNTER — Encounter (INDEPENDENT_AMBULATORY_CARE_PROVIDER_SITE_OTHER): Payer: Self-pay

## 2016-11-11 ENCOUNTER — Other Ambulatory Visit (HOSPITAL_COMMUNITY): Payer: Self-pay | Admitting: Adult Health Nurse Practitioner

## 2016-11-11 DIAGNOSIS — R1031 Right lower quadrant pain: Secondary | ICD-10-CM

## 2016-11-15 ENCOUNTER — Ambulatory Visit (HOSPITAL_COMMUNITY)
Admission: RE | Admit: 2016-11-15 | Discharge: 2016-11-15 | Disposition: A | Discharge status: Home / Self Care | Payer: Medicaid Other | Source: Ambulatory Visit | Attending: Adult Health Nurse Practitioner | Admitting: Adult Health Nurse Practitioner

## 2016-11-15 DIAGNOSIS — R1031 Right lower quadrant pain: Secondary | ICD-10-CM | POA: Insufficient documentation

## 2016-11-15 DIAGNOSIS — K8020 Calculus of gallbladder without cholecystitis without obstruction: Secondary | ICD-10-CM | POA: Diagnosis not present

## 2016-11-15 DIAGNOSIS — K760 Fatty (change of) liver, not elsewhere classified: Secondary | ICD-10-CM | POA: Diagnosis not present

## 2016-11-27 ENCOUNTER — Ambulatory Visit (INDEPENDENT_AMBULATORY_CARE_PROVIDER_SITE_OTHER): Payer: Self-pay | Admitting: Internal Medicine

## 2016-11-27 ENCOUNTER — Encounter (INDEPENDENT_AMBULATORY_CARE_PROVIDER_SITE_OTHER): Payer: Self-pay | Admitting: *Deleted

## 2016-11-27 ENCOUNTER — Encounter (INDEPENDENT_AMBULATORY_CARE_PROVIDER_SITE_OTHER): Payer: Self-pay | Admitting: Internal Medicine

## 2016-11-27 VITALS — BP 97/70 | HR 56 | Temp 98.0°F | Ht 74.0 in | Wt 168.9 lb

## 2016-11-27 DIAGNOSIS — E7800 Pure hypercholesterolemia, unspecified: Secondary | ICD-10-CM

## 2016-11-27 DIAGNOSIS — I152 Hypertension secondary to endocrine disorders: Secondary | ICD-10-CM | POA: Insufficient documentation

## 2016-11-27 DIAGNOSIS — B1710 Acute hepatitis C without hepatic coma: Secondary | ICD-10-CM

## 2016-11-27 DIAGNOSIS — R768 Other specified abnormal immunological findings in serum: Secondary | ICD-10-CM

## 2016-11-27 DIAGNOSIS — I10 Essential (primary) hypertension: Secondary | ICD-10-CM

## 2016-11-27 HISTORY — DX: Pure hypercholesterolemia, unspecified: E78.00

## 2016-11-27 HISTORY — DX: Essential (primary) hypertension: I10

## 2016-11-27 NOTE — Progress Notes (Signed)
   Subjective:    Patient ID: Oscar MannerChristopher J Moudy, male    DOB: 10/02/1975, 41 y.o.   MRN: [id number]  HPI Referred by Sharon SellerKatherine Hemberg for Hepatitis tx treatment. 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  Patient was unaware of being Hepatitis C. He states he has been doing okay.  Has had weight loss.  States he smokes marijuana.  Appetite is okay. Has a BM every 2-3 days.  Hx of diabetes x 10 yrs. States he has not been taking regular diabetic medication in years. Started new diabetic medication in October.    11/04/2016 Hep C antibody positive.  Hep A IgM negative. Hep B surface Ag negative Hep B core IgM Ab negative.  10/31/2016 total bili 0.8, ALP 64, AST 59, ALT 127. H and H 16.2 and 45.7 Review of Systems Past Medical History:  Diagnosis Date  . Acid reflux   . Essential hypertension, benign 11/27/2016  . Hepatitis C antibody test positive 11/27/2016  . High cholesterol 11/27/2016    No past surgical history on file.  Allergies  Allergen Reactions  . K-Dur [Potassium Chloride] Nausea And Vomiting  . Tamiflu [Oseltamivir Phosphate] Hives    Current Outpatient Medications on File Prior to Visit  Medication Sig Dispense Refill  . lisinopril (PRINIVIL,ZESTRIL) 10 MG tablet Take 10 mg daily by mouth.    . metFORMIN (GLUCOPHAGE) 500 MG tablet Take 1 tablet (500 mg total) by mouth 2 (two) times daily with a meal. 60 tablet 0  . omeprazole (PRILOSEC OTC) 20 MG tablet Take 20 mg by mouth daily.    . pravastatin (PRAVACHOL) 40 MG tablet Take 40 mg daily by mouth.     No current facility-administered medications on file prior to visit.         Objective:   Physical Exam Blood pressure 97/70, pulse (!) 56, temperature 98 F (36.7 C), height 6\' 2"  (1.88 m), weight 168 lb 14.4 oz (76.6 kg). Alert and oriented. Skin warm and dry. Oral mucosa is moist.   . Sclera anicteric, conjunctivae is pink. Thyroid not enlarged. No cervical lymphadenopathy. Lungs clear. Heart regular rate and  rhythm.  Abdomen is soft. Bowel sounds are positive. No hepatomegaly. No abdominal masses felt. No tenderness.  No edema to lower extremities.         Assessment & Plan:  Hep. C. Will get initial labs: AFP, PT/INR, Urine drug Screen, Hep C quaint, Hep C genotype, US abdomen elast

## 2016-11-27 NOTE — Patient Instructions (Signed)
Labs. OV pending. 

## 2016-12-03 LAB — HEPATITIS C GENOTYPE

## 2016-12-03 LAB — DRUG ABUSE PANEL 10-50 NO CONF, U
AMPHETAMINES (1000 ng/mL SCRN): NEGATIVE
BARBITURATES: NEGATIVE
BENZODIAZEPINES: NEGATIVE
COCAINE METABOLITES: NEGATIVE
MARIJUANA MET (50 ng/mL SCRN): POSITIVE — AB
METHADONE: NEGATIVE
METHAQUALONE: NEGATIVE
OPIATES: NEGATIVE
PHENCYCLIDINE: NEGATIVE
PROPOXYPHENE: NEGATIVE

## 2016-12-03 LAB — AFP TUMOR MARKER: AFP-Tumor Marker: 8.7 ng/mL — ABNORMAL HIGH (ref <6.1)

## 2016-12-03 LAB — PROTIME-INR
INR: 1
PROTHROMBIN TIME: 10.1 s (ref 9.0–11.5)

## 2016-12-03 LAB — HEPATITIS C RNA QUANTITATIVE
HCV Quantitative Log: 5.91 Log IU/mL — ABNORMAL HIGH
HCV RNA, PCR, QN: 804000 [IU]/mL — AB

## 2016-12-10 ENCOUNTER — Ambulatory Visit (HOSPITAL_COMMUNITY)
Admission: RE | Admit: 2016-12-10 | Discharge: 2016-12-10 | Disposition: A | Discharge status: Home / Self Care | Payer: Medicaid Other | Source: Ambulatory Visit | Attending: Internal Medicine | Admitting: Internal Medicine

## 2016-12-10 DIAGNOSIS — B1710 Acute hepatitis C without hepatic coma: Secondary | ICD-10-CM | POA: Insufficient documentation

## 2016-12-10 DIAGNOSIS — K8020 Calculus of gallbladder without cholecystitis without obstruction: Secondary | ICD-10-CM | POA: Insufficient documentation

## 2017-01-23 ENCOUNTER — Telehealth (INDEPENDENT_AMBULATORY_CARE_PROVIDER_SITE_OTHER): Payer: Self-pay | Admitting: *Deleted

## 2017-01-23 NOTE — Telephone Encounter (Signed)
Patient presented to the office this afternoon to pick up his Hep C medication. He will call office to let us know the exact start date , so that we may arrange his 8 week office visit and lab work..Marland Kitchen

## 2017-01-24 ENCOUNTER — Telehealth (INDEPENDENT_AMBULATORY_CARE_PROVIDER_SITE_OTHER): Payer: Self-pay | Admitting: *Deleted

## 2017-01-24 DIAGNOSIS — B182 Chronic viral hepatitis C: Secondary | ICD-10-CM

## 2017-01-24 NOTE — Telephone Encounter (Signed)
Patient called the office , he started his Hep C medication today,01/25/2016. Per Delrae Renderri Setzer,NP -C patient will need the following labs in 8 weeks ; CBC , Comprehensive Panel , and Hep C RNA Quant ,  as well as a office visit. Labs are noted for 03/20/2017.

## 2017-01-28 ENCOUNTER — Encounter (INDEPENDENT_AMBULATORY_CARE_PROVIDER_SITE_OTHER): Payer: Self-pay | Admitting: Internal Medicine

## 2017-01-28 NOTE — Telephone Encounter (Signed)
Patient was given an appointment for 03/24/17 at 11:30am.  A letter was mailed to the patient.

## 2017-03-04 DIAGNOSIS — L602 Onychogryphosis: Secondary | ICD-10-CM | POA: Diagnosis not present

## 2017-03-04 DIAGNOSIS — E1169 Type 2 diabetes mellitus with other specified complication: Secondary | ICD-10-CM | POA: Diagnosis not present

## 2017-03-04 DIAGNOSIS — B351 Tinea unguium: Secondary | ICD-10-CM | POA: Diagnosis not present

## 2017-03-04 DIAGNOSIS — B353 Tinea pedis: Secondary | ICD-10-CM | POA: Diagnosis not present

## 2017-03-04 DIAGNOSIS — E785 Hyperlipidemia, unspecified: Secondary | ICD-10-CM | POA: Diagnosis not present

## 2017-03-04 DIAGNOSIS — E1140 Type 2 diabetes mellitus with diabetic neuropathy, unspecified: Secondary | ICD-10-CM | POA: Diagnosis not present

## 2017-03-04 DIAGNOSIS — E1149 Type 2 diabetes mellitus with other diabetic neurological complication: Secondary | ICD-10-CM | POA: Diagnosis not present

## 2017-03-13 ENCOUNTER — Encounter (INDEPENDENT_AMBULATORY_CARE_PROVIDER_SITE_OTHER): Payer: Self-pay | Admitting: *Deleted

## 2017-03-13 ENCOUNTER — Other Ambulatory Visit (INDEPENDENT_AMBULATORY_CARE_PROVIDER_SITE_OTHER): Payer: Self-pay | Admitting: *Deleted

## 2017-03-13 DIAGNOSIS — B182 Chronic viral hepatitis C: Secondary | ICD-10-CM

## 2017-03-24 ENCOUNTER — Encounter (INDEPENDENT_AMBULATORY_CARE_PROVIDER_SITE_OTHER): Payer: Self-pay | Admitting: Internal Medicine

## 2017-03-24 ENCOUNTER — Ambulatory Visit (INDEPENDENT_AMBULATORY_CARE_PROVIDER_SITE_OTHER): Payer: Medicaid Other | Admitting: Internal Medicine

## 2017-03-24 VITALS — BP 110/84 | HR 76 | Temp 98.0°F | Ht 74.0 in | Wt 167.6 lb

## 2017-03-24 DIAGNOSIS — B182 Chronic viral hepatitis C: Secondary | ICD-10-CM

## 2017-03-24 LAB — COMPREHENSIVE METABOLIC PANEL
AG Ratio: 1.6 (calc) (ref 1.0–2.5)
ALKALINE PHOSPHATASE (APISO): 65 U/L (ref 40–115)
ALT: 14 U/L (ref 9–46)
AST: 13 U/L (ref 10–40)
Albumin: 4.3 g/dL (ref 3.6–5.1)
BUN: 15 mg/dL (ref 7–25)
CALCIUM: 9.8 mg/dL (ref 8.6–10.3)
CO2: 30 mmol/L (ref 20–32)
Chloride: 101 mmol/L (ref 98–110)
Creat: 0.83 mg/dL (ref 0.60–1.35)
Globulin: 2.7 g/dL (calc) (ref 1.9–3.7)
Glucose, Bld: 142 mg/dL — ABNORMAL HIGH (ref 65–99)
POTASSIUM: 4.4 mmol/L (ref 3.5–5.3)
Sodium: 138 mmol/L (ref 135–146)
Total Bilirubin: 0.4 mg/dL (ref 0.2–1.2)
Total Protein: 7 g/dL (ref 6.1–8.1)

## 2017-03-24 LAB — CBC
HEMATOCRIT: 43.1 % (ref 38.5–50.0)
Hemoglobin: 14.7 g/dL (ref 13.2–17.1)
MCH: 30.8 pg (ref 27.0–33.0)
MCHC: 34.1 g/dL (ref 32.0–36.0)
MCV: 90.4 fL (ref 80.0–100.0)
MPV: 10.8 fL (ref 7.5–12.5)
PLATELETS: 238 10*3/uL (ref 140–400)
RBC: 4.77 10*6/uL (ref 4.20–5.80)
RDW: 12.2 % (ref 11.0–15.0)
WBC: 7.9 10*3/uL (ref 3.8–10.8)

## 2017-03-24 LAB — HEPATITIS C RNA QUANTITATIVE
HCV QUANT LOG: NOT DETECTED {Log_IU}/mL
HCV RNA, PCR, QN: NOT DETECTED [IU]/mL

## 2017-03-24 NOTE — Patient Instructions (Signed)
OV in 6 months. 

## 2017-03-24 NOTE — Progress Notes (Signed)
Subjective:    Patient ID: Oscar Cox, male    DOB: 07-May-1975, 42 y.o.   MRN: [id number]  HPI Here today for f/u. Hx of Hepatitis C. Started tx with Mayret 01/24/2017. Will be treated for 8 weeks. He tells me he is doing good. He has a stuffy head. No fever. Appetite is good. No weight loss. He is having a BM every couple of GI NO GI complaints. He has finished treatment.  Genotype 1A CBC and Hepatic look good. Hep C quaint is not back.     Diabetic since his late 75s.   Hepatic Function Latest Ref Rng & Units 03/21/2017 06/03/2012  Total Protein 6.1 - 8.1 g/dL 7.0 7.6  Albumin 3.5 - 5.2 g/dL - 4.2  AST 10 - 40 U/L 13 31  ALT 9 - 46 U/L 14 44  Alk Phosphatase 39 - 117 U/L - 64  Total Bilirubin 0.2 - 1.2 mg/dL 0.4 0.5   CBC    Component Value Date/Time   WBC 7.9 03/21/2017 1000   RBC 4.77 03/21/2017 1000   HGB 14.7 03/21/2017 1000   HCT 43.1 03/21/2017 1000   PLT 238 03/21/2017 1000   MCV 90.4 03/21/2017 1000   MCH 30.8 03/21/2017 1000   MCHC 34.1 03/21/2017 1000   RDW 12.2 03/21/2017 1000   LYMPHSABS 2.8 06/03/2012 1355   MONOABS 0.6 06/03/2012 1355   EOSABS 0.3 06/03/2012 1355   BASOSABS 0.0 06/03/2012 1355       11/04/2016 Hep C antibody positive.  Hep A IgM negative. Hep B surface Ag negative Hep B core IgM Ab negative.    Review of Systems Past Medical History:  Diagnosis Date  . Acid reflux   . Essential hypertension, benign 11/27/2016  . Hepatitis C antibody test positive 11/27/2016  . High cholesterol 11/27/2016    History reviewed. No pertinent surgical history.  Allergies  Allergen Reactions  . K-Dur [Potassium Chloride] Nausea And Vomiting  . Tamiflu [Oseltamivir Phosphate] Hives    Current Outpatient Medications on File Prior to Visit  Medication Sig Dispense Refill  . gabapentin (NEURONTIN) 100 MG capsule Take 100 mg by mouth 3 (three) times daily.    . Glecaprevir-Pibrentasvir (MAVYRET) 100-40 MG TABS Take by mouth.    Marland Kitchen  lisinopril (PRINIVIL,ZESTRIL) 10 MG tablet Take 10 mg daily by mouth.    . metFORMIN (GLUCOPHAGE) 500 MG tablet Take 1 tablet (500 mg total) by mouth 2 (two) times daily with a meal. 60 tablet 0  . omeprazole (PRILOSEC OTC) 20 MG tablet Take 20 mg by mouth daily.    . pravastatin (PRAVACHOL) 40 MG tablet Take 40 mg by mouth daily. On hold while he is taking Mavyret     No current facility-administered medications on file prior to visit.         Objective:   Physical Exam Blood pressure 110/84, pulse 76, temperature 98 F (36.7 C), height 6' 2" (1.88 m), weight 167 lb 9.6 oz (76 kg).  Alert and oriented. Skin warm and dry. Oral mucosa is moist.   . Sclera anicteric, conjunctivae is pink. Thyroid not enlarged. No cervical lymphadenopathy. Lungs clear. Heart regular rate and rhythm.  Abdomen is soft. Bowel sounds are positive. No hepatomegaly. No abdominal masses felt. No tenderness.  No edema to lower extremities.       Assessment & Plan:  Hepatitis C. He is doing well. Will see back in 6 months. Will call patient when I get Quaint back.

## 2017-05-23 ENCOUNTER — Other Ambulatory Visit (HOSPITAL_COMMUNITY): Payer: Self-pay | Admitting: Adult Health Nurse Practitioner

## 2017-05-23 ENCOUNTER — Ambulatory Visit (HOSPITAL_COMMUNITY)
Admission: RE | Admit: 2017-05-23 | Discharge: 2017-05-23 | Disposition: A | Discharge status: Home / Self Care | Payer: Medicaid Other | Source: Ambulatory Visit | Attending: Adult Health Nurse Practitioner | Admitting: Adult Health Nurse Practitioner

## 2017-05-23 DIAGNOSIS — M25561 Pain in right knee: Secondary | ICD-10-CM | POA: Insufficient documentation

## 2017-05-23 DIAGNOSIS — M25562 Pain in left knee: Secondary | ICD-10-CM | POA: Insufficient documentation

## 2017-05-23 DIAGNOSIS — R52 Pain, unspecified: Secondary | ICD-10-CM

## 2017-05-23 DIAGNOSIS — M5137 Other intervertebral disc degeneration, lumbosacral region: Secondary | ICD-10-CM | POA: Diagnosis not present

## 2017-05-23 DIAGNOSIS — M5440 Lumbago with sciatica, unspecified side: Secondary | ICD-10-CM | POA: Diagnosis not present

## 2017-08-15 DIAGNOSIS — R197 Diarrhea, unspecified: Secondary | ICD-10-CM | POA: Diagnosis not present

## 2017-08-19 DIAGNOSIS — R197 Diarrhea, unspecified: Secondary | ICD-10-CM | POA: Diagnosis not present

## 2017-09-02 DIAGNOSIS — I10 Essential (primary) hypertension: Secondary | ICD-10-CM | POA: Diagnosis not present

## 2017-09-02 DIAGNOSIS — E1159 Type 2 diabetes mellitus with other circulatory complications: Secondary | ICD-10-CM | POA: Diagnosis not present

## 2017-09-02 DIAGNOSIS — E1149 Type 2 diabetes mellitus with other diabetic neurological complication: Secondary | ICD-10-CM | POA: Diagnosis not present

## 2017-09-02 DIAGNOSIS — K219 Gastro-esophageal reflux disease without esophagitis: Secondary | ICD-10-CM | POA: Diagnosis not present

## 2017-09-02 DIAGNOSIS — E785 Hyperlipidemia, unspecified: Secondary | ICD-10-CM | POA: Diagnosis not present

## 2017-09-02 DIAGNOSIS — E1169 Type 2 diabetes mellitus with other specified complication: Secondary | ICD-10-CM | POA: Diagnosis not present

## 2017-09-02 DIAGNOSIS — E1140 Type 2 diabetes mellitus with diabetic neuropathy, unspecified: Secondary | ICD-10-CM | POA: Diagnosis not present

## 2017-09-20 DIAGNOSIS — J069 Acute upper respiratory infection, unspecified: Secondary | ICD-10-CM | POA: Diagnosis not present

## 2017-09-20 DIAGNOSIS — J40 Bronchitis, not specified as acute or chronic: Secondary | ICD-10-CM | POA: Diagnosis not present

## 2017-09-22 ENCOUNTER — Ambulatory Visit (INDEPENDENT_AMBULATORY_CARE_PROVIDER_SITE_OTHER): Payer: Medicaid Other | Admitting: Internal Medicine

## 2017-09-22 ENCOUNTER — Encounter (INDEPENDENT_AMBULATORY_CARE_PROVIDER_SITE_OTHER): Payer: Self-pay | Admitting: Internal Medicine

## 2017-09-22 VITALS — BP 148/90 | HR 72 | Temp 97.6°F | Ht 74.0 in | Wt 192.4 lb

## 2017-09-22 DIAGNOSIS — B182 Chronic viral hepatitis C: Secondary | ICD-10-CM

## 2017-09-22 NOTE — Patient Instructions (Signed)
OV in 1 year.  

## 2017-09-22 NOTE — Progress Notes (Signed)
   Subjective:    Patient ID: Oscar Cox, male    DOB: 22-Jun-1975, 42 y.o.   MRN: [id number]  HPI Here today for f/u. Last seen in March of 2019. Hx of Hepatitis C. He completed Mavyret x 8 weeks. He cleared the virus. Genotype 1A.  03/21/2017 Hep C quaint undetected. 12/10/2016 [place] elast:  F2-F3.  He has no GI complaints. His appetite is good.  He has gained weight. BMs are normal. No melena or BRRB.  CMP Latest Ref Rng & Units 03/21/2017 09/30/2014 06/03/2012  Glucose 65 - 99 mg/dL 712(R) 975(O) 832(P)  BUN 7 - 25 mg/dL 15 12 13   Creatinine 0.60 - 1.35 mg/dL 4.98 2.64 1.58  Sodium 135 - 146 mmol/L 138 137 138  Potassium 3.5 - 5.3 mmol/L 4.4 4.0 3.6  Chloride 98 - 110 mmol/L 101 100(L) 99  CO2 20 - 32 mmol/L 30 27 27   Calcium 8.6 - 10.3 mg/dL 9.8 9.0 9.9  Total Protein 6.1 - 8.1 g/dL 7.0 - 7.6  Total Bilirubin 0.2 - 1.2 mg/dL 0.4 - 0.5  Alkaline Phos 39 - 117 U/L - - 64  AST 10 - 40 U/L 13 - 31  ALT 9 - 46 U/L 14 - 44      Review of Systems     Past Medical History:  Diagnosis Date  . Acid reflux   . Essential hypertension, benign 11/27/2016  . Hepatitis C antibody test positive 11/27/2016  . High cholesterol 11/27/2016    History reviewed. No pertinent surgical history.  Allergies  Allergen Reactions  . K-Dur [Potassium Chloride] Nausea And Vomiting  . Tamiflu [Oseltamivir Phosphate] Hives    Current Outpatient Medications on File Prior to Visit  Medication Sig Dispense Refill  . gabapentin (NEURONTIN) 100 MG capsule Take 100 mg by mouth 3 (three) times daily.    Marland Kitchen lisinopril (PRINIVIL,ZESTRIL) 10 MG tablet Take 10 mg daily by mouth.    Marland Kitchen omeprazole (PRILOSEC OTC) 20 MG tablet Take 20 mg by mouth daily.     No current facility-administered medications on file prior to visit.      Objective:   Physical Exam Blood pressure (!) 148/90, pulse 72, temperature 97.6 F (36.4 C), height 6\' 2"  (1.88 m), weight 192 lb 6.4 oz (87.3 kg). Alert and oriented. Skin  warm and dry. Oral mucosa is moist.   . Sclera anicteric, conjunctivae is pink. Thyroid not enlarged. No cervical lymphadenopathy. Lungs clear. Heart regular rate and rhythm.  Abdomen is soft. Bowel sounds are positive. No hepatomegaly. No abdominal masses felt. No tenderness.  No edema to lower extremities.          Assessment & Plan:  Hepatitis C. He cleared the virus. He is doing well.  Hepatic and Hep C quaint. [place] RUQ OV in 1 year.

## 2017-09-24 LAB — HEPATIC FUNCTION PANEL
AG Ratio: 1.8 (calc) (ref 1.0–2.5)
ALBUMIN MSPROF: 4.9 g/dL (ref 3.6–5.1)
ALT: 19 U/L (ref 9–46)
AST: 15 U/L (ref 10–40)
Alkaline phosphatase (APISO): 59 U/L (ref 40–115)
BILIRUBIN TOTAL: 0.5 mg/dL (ref 0.2–1.2)
Bilirubin, Direct: 0.1 mg/dL (ref 0.0–0.2)
GLOBULIN: 2.7 g/dL (ref 1.9–3.7)
Indirect Bilirubin: 0.4 mg/dL (calc) (ref 0.2–1.2)
Total Protein: 7.6 g/dL (ref 6.1–8.1)

## 2017-09-24 LAB — HEPATITIS C RNA QUANTITATIVE
HCV QUANT LOG: NOT DETECTED {Log_IU}/mL
HCV RNA, PCR, QN: NOT DETECTED [IU]/mL

## 2017-09-25 ENCOUNTER — Ambulatory Visit (HOSPITAL_COMMUNITY): Payer: Medicaid Other

## 2017-09-25 ENCOUNTER — Ambulatory Visit (HOSPITAL_COMMUNITY)
Admission: RE | Admit: 2017-09-25 | Discharge: 2017-09-25 | Disposition: A | Discharge status: Home / Self Care | Payer: Medicaid Other | Source: Ambulatory Visit | Attending: Internal Medicine | Admitting: Internal Medicine

## 2017-09-25 DIAGNOSIS — B182 Chronic viral hepatitis C: Secondary | ICD-10-CM | POA: Diagnosis not present

## 2017-09-25 DIAGNOSIS — K8020 Calculus of gallbladder without cholecystitis without obstruction: Secondary | ICD-10-CM | POA: Insufficient documentation

## 2017-12-26 DIAGNOSIS — J209 Acute bronchitis, unspecified: Secondary | ICD-10-CM | POA: Diagnosis not present

## 2017-12-26 DIAGNOSIS — L309 Dermatitis, unspecified: Secondary | ICD-10-CM | POA: Diagnosis not present

## 2018-01-22 DIAGNOSIS — H5213 Myopia, bilateral: Secondary | ICD-10-CM | POA: Diagnosis not present

## 2018-01-22 DIAGNOSIS — H52223 Regular astigmatism, bilateral: Secondary | ICD-10-CM | POA: Diagnosis not present

## 2018-01-22 DIAGNOSIS — E119 Type 2 diabetes mellitus without complications: Secondary | ICD-10-CM | POA: Diagnosis not present

## 2018-01-22 DIAGNOSIS — H524 Presbyopia: Secondary | ICD-10-CM | POA: Diagnosis not present

## 2018-01-22 LAB — HM DIABETES EYE EXAM

## 2018-02-09 DIAGNOSIS — H524 Presbyopia: Secondary | ICD-10-CM | POA: Diagnosis not present

## 2018-02-09 DIAGNOSIS — H52223 Regular astigmatism, bilateral: Secondary | ICD-10-CM | POA: Diagnosis not present

## 2018-03-13 DIAGNOSIS — J0100 Acute maxillary sinusitis, unspecified: Secondary | ICD-10-CM | POA: Diagnosis not present

## 2018-03-13 DIAGNOSIS — L219 Seborrheic dermatitis, unspecified: Secondary | ICD-10-CM | POA: Diagnosis not present

## 2018-04-23 DIAGNOSIS — M62838 Other muscle spasm: Secondary | ICD-10-CM | POA: Diagnosis not present

## 2018-07-01 DIAGNOSIS — W57XXXA Bitten or stung by nonvenomous insect and other nonvenomous arthropods, initial encounter: Secondary | ICD-10-CM | POA: Diagnosis not present

## 2018-07-01 DIAGNOSIS — L03116 Cellulitis of left lower limb: Secondary | ICD-10-CM | POA: Diagnosis not present

## 2018-07-01 DIAGNOSIS — S80862A Insect bite (nonvenomous), left lower leg, initial encounter: Secondary | ICD-10-CM | POA: Diagnosis not present

## 2018-09-10 DIAGNOSIS — M67921 Unspecified disorder of synovium and tendon, right upper arm: Secondary | ICD-10-CM | POA: Diagnosis not present

## 2018-09-23 ENCOUNTER — Ambulatory Visit (INDEPENDENT_AMBULATORY_CARE_PROVIDER_SITE_OTHER): Payer: Medicaid Other | Admitting: Nurse Practitioner

## 2018-09-24 ENCOUNTER — Ambulatory Visit (INDEPENDENT_AMBULATORY_CARE_PROVIDER_SITE_OTHER): Payer: Medicaid Other | Admitting: Nurse Practitioner

## 2018-09-24 ENCOUNTER — Other Ambulatory Visit: Payer: Self-pay

## 2018-09-24 ENCOUNTER — Encounter (INDEPENDENT_AMBULATORY_CARE_PROVIDER_SITE_OTHER): Payer: Self-pay | Admitting: Nurse Practitioner

## 2018-09-24 VITALS — BP 143/97 | HR 92 | Temp 98.2°F | Ht 74.0 in | Wt 222.9 lb

## 2018-09-24 DIAGNOSIS — Z8619 Personal history of other infectious and parasitic diseases: Secondary | ICD-10-CM

## 2018-09-24 DIAGNOSIS — B182 Chronic viral hepatitis C: Secondary | ICD-10-CM

## 2018-09-24 HISTORY — DX: Personal history of other infectious and parasitic diseases: Z86.19

## 2018-09-24 NOTE — Patient Instructions (Signed)
1. Complete the provided lab order   2. Exercise, lose weight to keep your liver healthy   3. Follow up in 1 year

## 2018-09-24 NOTE — Progress Notes (Signed)
   Subjective:    Patient ID: Oscar Cox, male    DOB: [birth year], 43 y.o.   MRN: [id number]  HPI Oscar Cox is a 43 year old male with a past medical history of GERD, remote IV drug use, chronic hepatitis C GT 1a treated with Mavyret x 8 weeks 01/24/2017 with SVR. His last Hep C RNA level 09/22/2017 was undetectable. He rarely drinks any alcohol, maybe 4 drinks in a year. 12/10/2016 [place] elast:  F2-F3. He denies having any complaints today. No GERD symptoms. He remains on Omeprazole 20mg  once daily. He is passing normal BMs daily. No rectal bleeding or black stools.   Abdominal sonogram 09/25/2017: Cholelithiasis without evidence of acute cholecystitis. Diffusely increased parenchymal echogenicity of the liver usually associated with hepatic steatosis or fibrosis.  Past Medical History:  Diagnosis Date  . Acid reflux   . Essential hypertension, benign 11/27/2016  . Hepatitis C antibody test positive 11/27/2016  . High cholesterol 11/27/2016       Objective:   Physical Exam  BP (!) 143/97   Pulse 92   Temp 98.2 F (36.8 C) (Oral)   Ht 6\' 2"  (1.88 m)   Wt 222 lb 14.4 oz (101.1 kg)   BMI 28.62 kg/m  General: 43 year old male in NAD Eyes: sclera nonincteric, conjunctiva pink Heart: RRR, no murmurs Lungs: Clear throughout Abdomen: soft, nontender, striae, no HSM, + BS x 4 quads    Assessment & Plan:   1. Hx of Hep C GT1a treated with Mavyret x 8 weeks 01/2017  with SVR  -check hepatic panel and Hep C RNA -follow up in office in 1 year  2. Obesity, hepatic steatosis  -discussed exercise, lose weight to avoid fatty liver disease   3. GERD, stable -continue Omeprazole 20mg  once daily

## 2018-09-30 LAB — HEPATIC FUNCTION PANEL
AG Ratio: 1.7 (calc) (ref 1.0–2.5)
ALT: 47 U/L — ABNORMAL HIGH (ref 9–46)
AST: 39 U/L (ref 10–40)
Albumin: 4.7 g/dL (ref 3.6–5.1)
Alkaline phosphatase (APISO): 54 U/L (ref 36–130)
Bilirubin, Direct: 0.1 mg/dL (ref 0.0–0.2)
Globulin: 2.8 g/dL (calc) (ref 1.9–3.7)
Indirect Bilirubin: 0.2 mg/dL (calc) (ref 0.2–1.2)
Total Bilirubin: 0.3 mg/dL (ref 0.2–1.2)
Total Protein: 7.5 g/dL (ref 6.1–8.1)

## 2018-09-30 LAB — HEPATITIS C RNA QUANTITATIVE
HCV Quantitative Log: 2.26 Log IU/mL — ABNORMAL HIGH
HCV RNA, PCR, QN: 183 IU/mL — ABNORMAL HIGH

## 2018-10-08 ENCOUNTER — Other Ambulatory Visit (INDEPENDENT_AMBULATORY_CARE_PROVIDER_SITE_OTHER): Payer: Self-pay | Admitting: Nurse Practitioner

## 2018-10-08 DIAGNOSIS — B182 Chronic viral hepatitis C: Secondary | ICD-10-CM

## 2018-10-21 DIAGNOSIS — B182 Chronic viral hepatitis C: Secondary | ICD-10-CM | POA: Diagnosis not present

## 2018-10-25 LAB — HEPATIC FUNCTION PANEL
AG Ratio: 1.6 (calc) (ref 1.0–2.5)
ALT: 49 U/L — ABNORMAL HIGH (ref 9–46)
AST: 29 U/L (ref 10–40)
Albumin: 4.4 g/dL (ref 3.6–5.1)
Alkaline phosphatase (APISO): 60 U/L (ref 36–130)
Bilirubin, Direct: 0.1 mg/dL (ref 0.0–0.2)
Globulin: 2.8 g/dL (calc) (ref 1.9–3.7)
Indirect Bilirubin: 0.3 mg/dL (calc) (ref 0.2–1.2)
Total Bilirubin: 0.4 mg/dL (ref 0.2–1.2)
Total Protein: 7.2 g/dL (ref 6.1–8.1)

## 2018-10-25 LAB — HEPATITIS C RNA QUANTITATIVE
HCV Quantitative Log: <1.18 Log IU/mL
HCV RNA, PCR, QN: <15 IU/mL

## 2018-11-04 ENCOUNTER — Other Ambulatory Visit (INDEPENDENT_AMBULATORY_CARE_PROVIDER_SITE_OTHER): Payer: Self-pay | Admitting: *Deleted

## 2018-11-04 DIAGNOSIS — B182 Chronic viral hepatitis C: Secondary | ICD-10-CM

## 2018-11-04 DIAGNOSIS — R768 Other specified abnormal immunological findings in serum: Secondary | ICD-10-CM

## 2018-11-04 DIAGNOSIS — E7800 Pure hypercholesterolemia, unspecified: Secondary | ICD-10-CM

## 2018-11-04 NOTE — Progress Notes (Signed)
Labs have been ordered

## 2019-02-18 IMAGING — DX DG LUMBAR SPINE 2-3V
3 series · 3 of 3 positions shown · non-contrast
Comparison: None.

CLINICAL DATA: 41-year-old male with a history of knee pain and
lumbar back pain

EXAM:
LUMBAR SPINE - 2-3 VIEW

[l-spine ap]
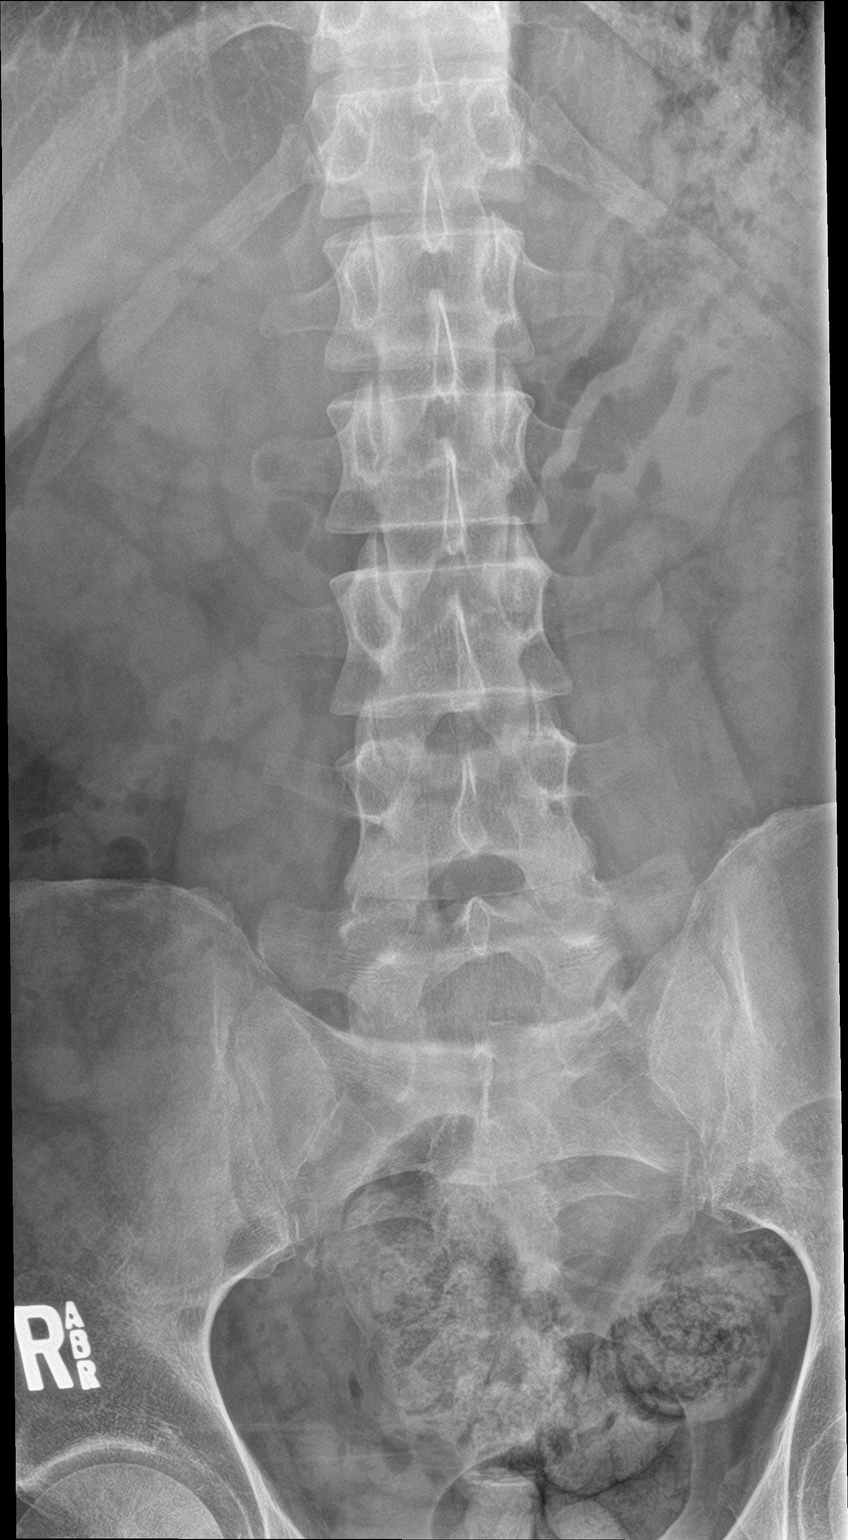

[l-spine lat]
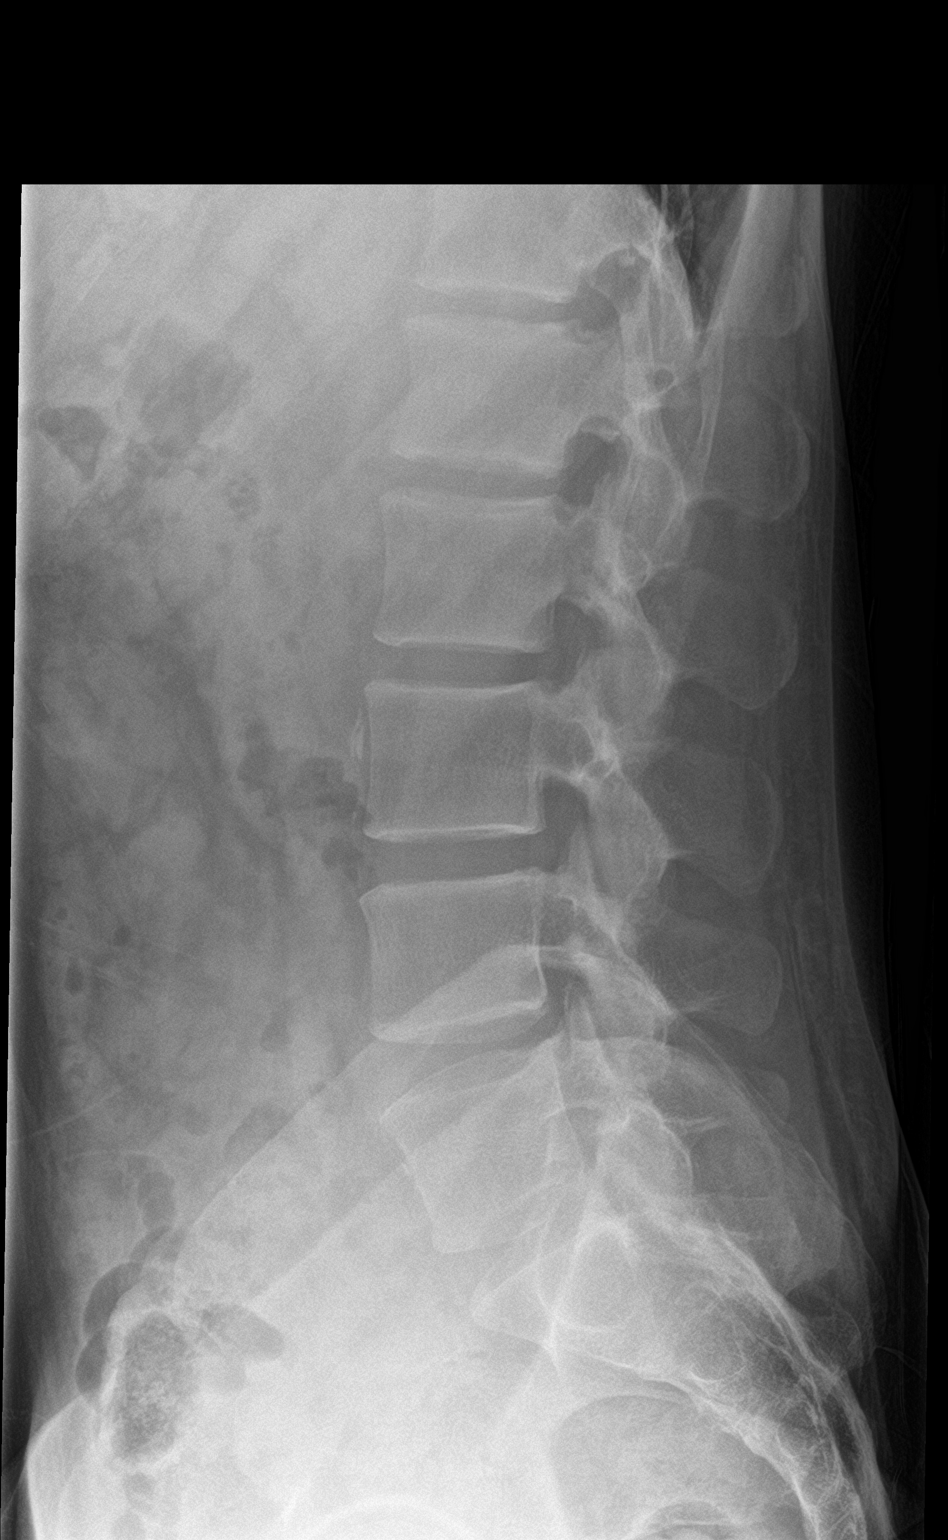

[l-spine spot]
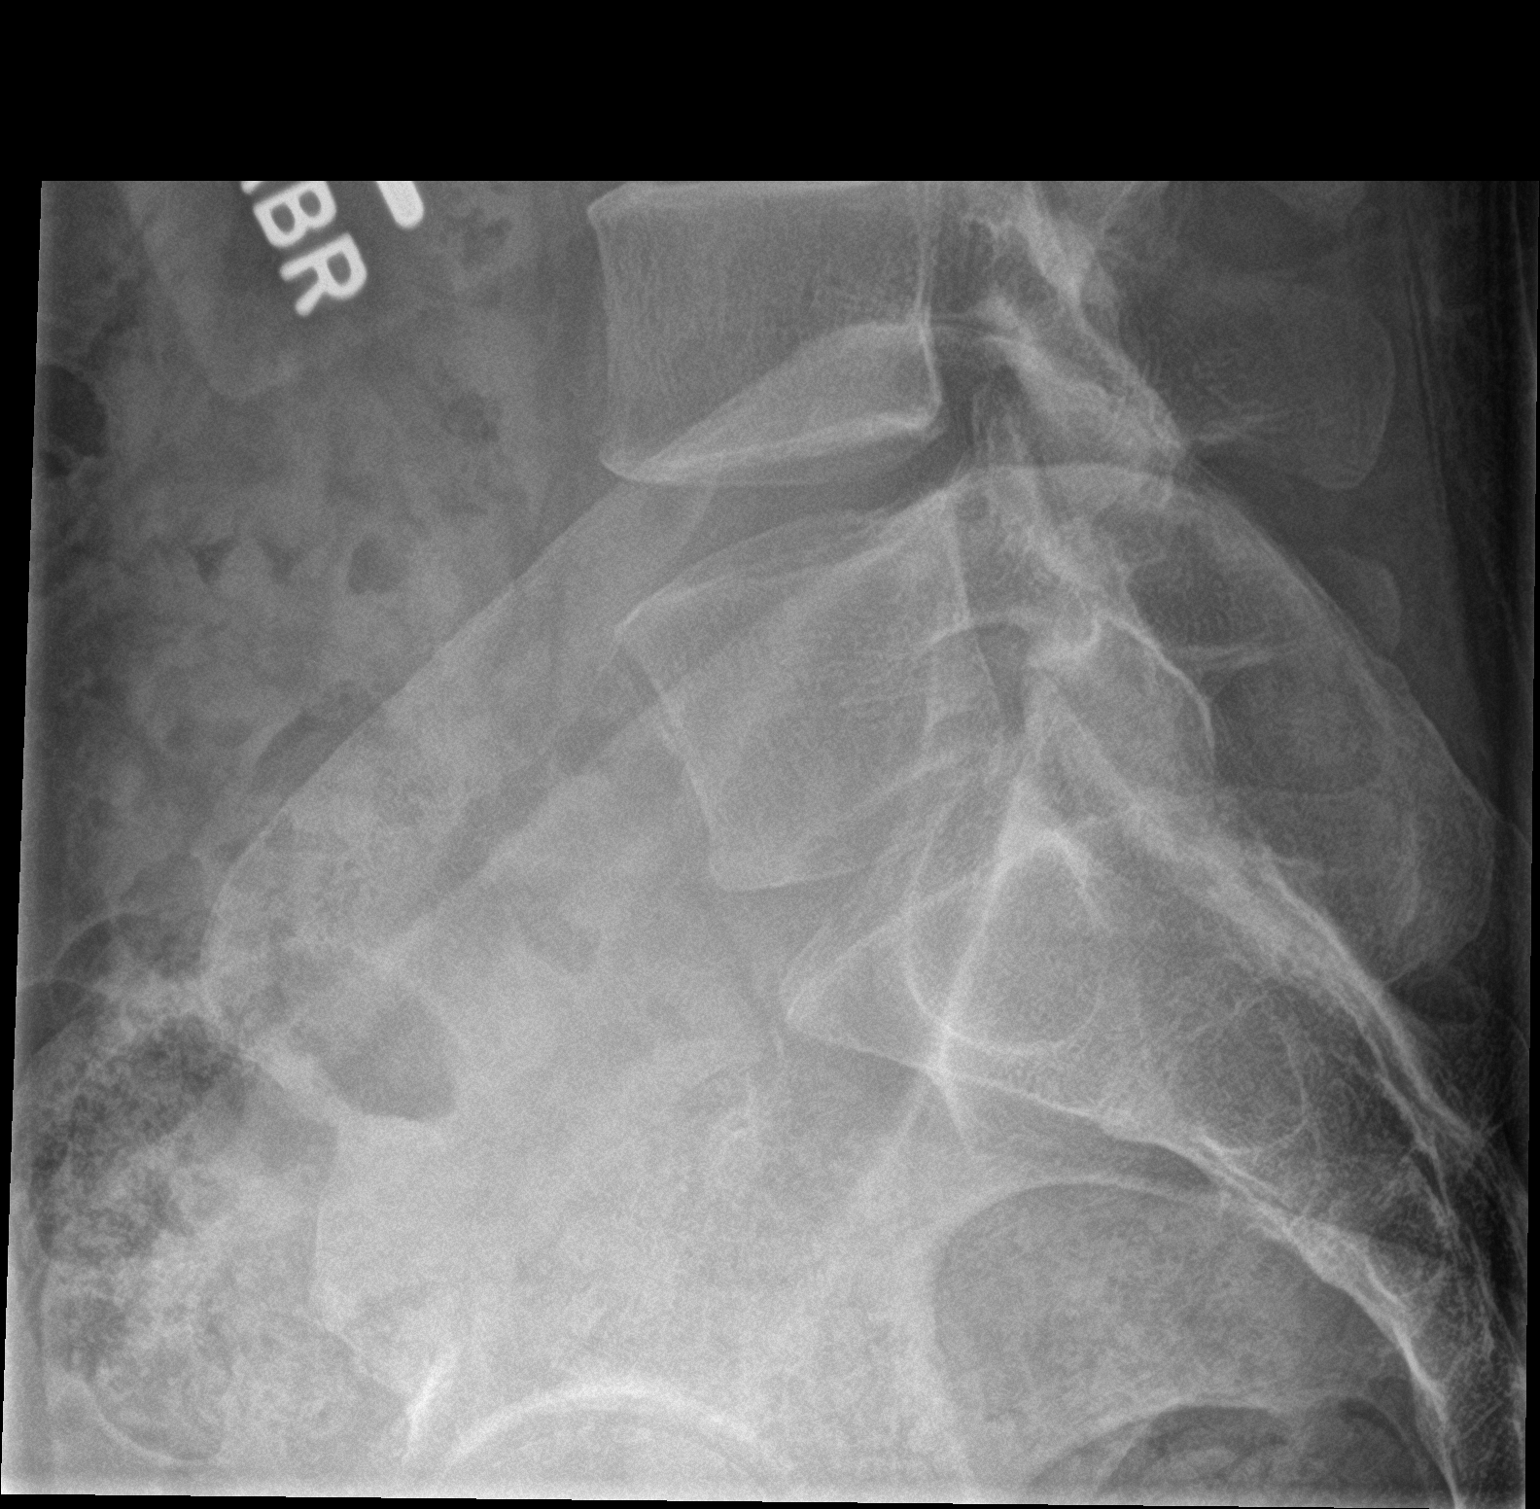

[3 of 3 positions shown; findings below may reference images not displayed]

FINDINGS: Lumbar Spine:

Lumbar vertebral elements maintain normal alignment without evidence
of anterolisthesis, retrolisthesis, subluxation.

No fracture line identified. Vertebral body heights maintained
maintained.

Early disc space narrowing and facet changes at L5-S1.

Unremarkable appearance of the visualized abdomen.
IMPRESSION: Negative for acute fracture or malalignment of the lumbar spine.

Early degenerative disc disease of L5-S1

## 2019-02-18 IMAGING — DX DG KNEE 1-2V*L*
2 series · 2 of 2 positions shown · non-contrast
Comparison: None

CLINICAL DATA: BILATERAL knee pain and low back pain for 1 year

EXAM:
LEFT KNEE - 1-2 VIEW

[knee ap]
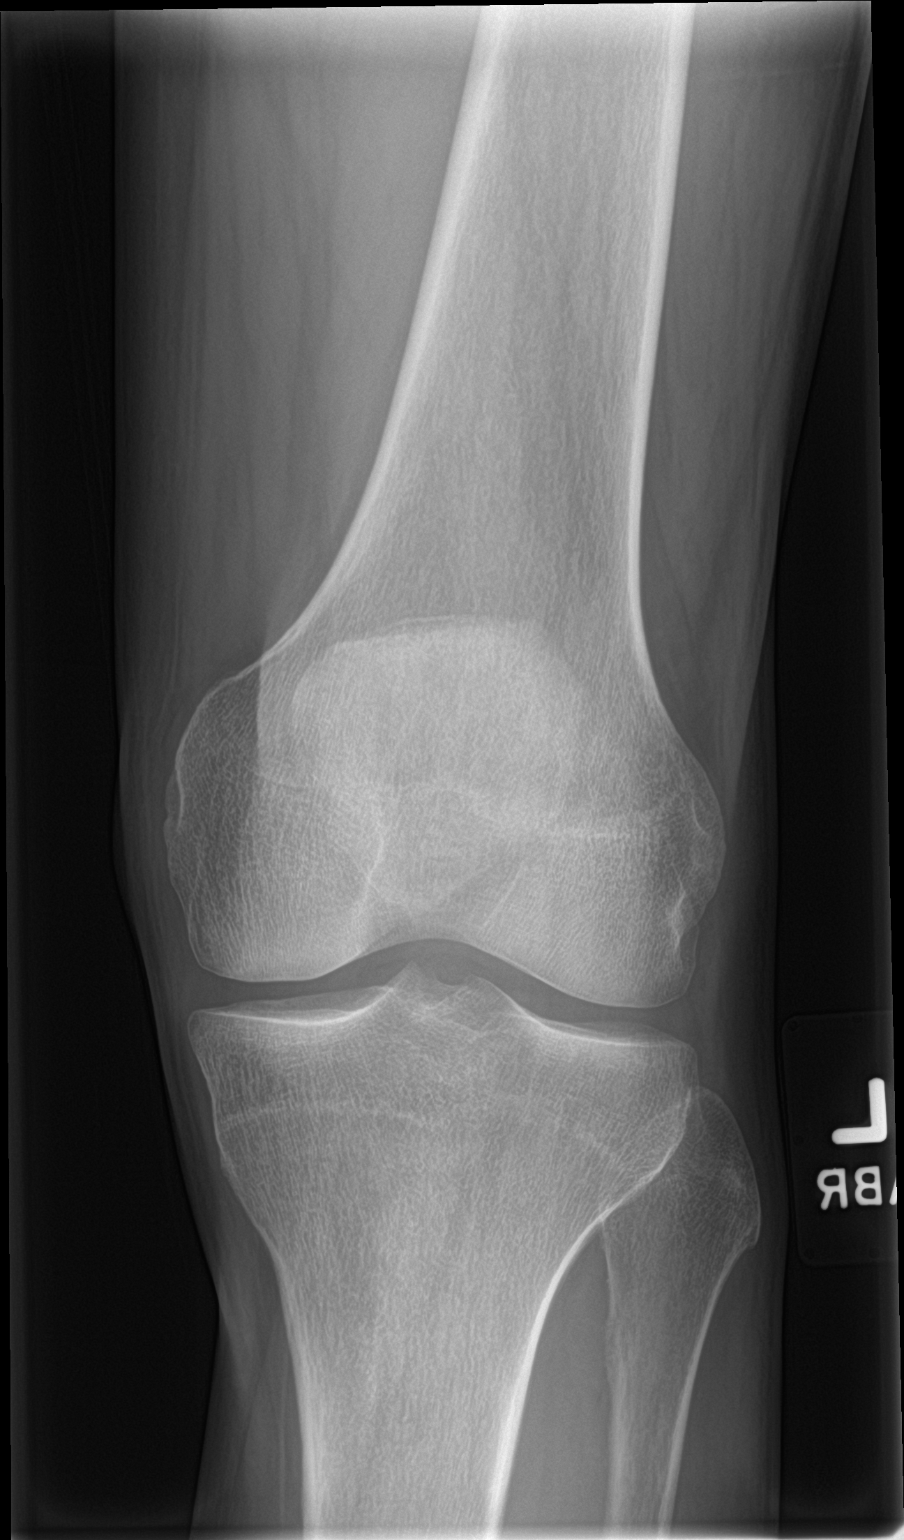

[knee lat]
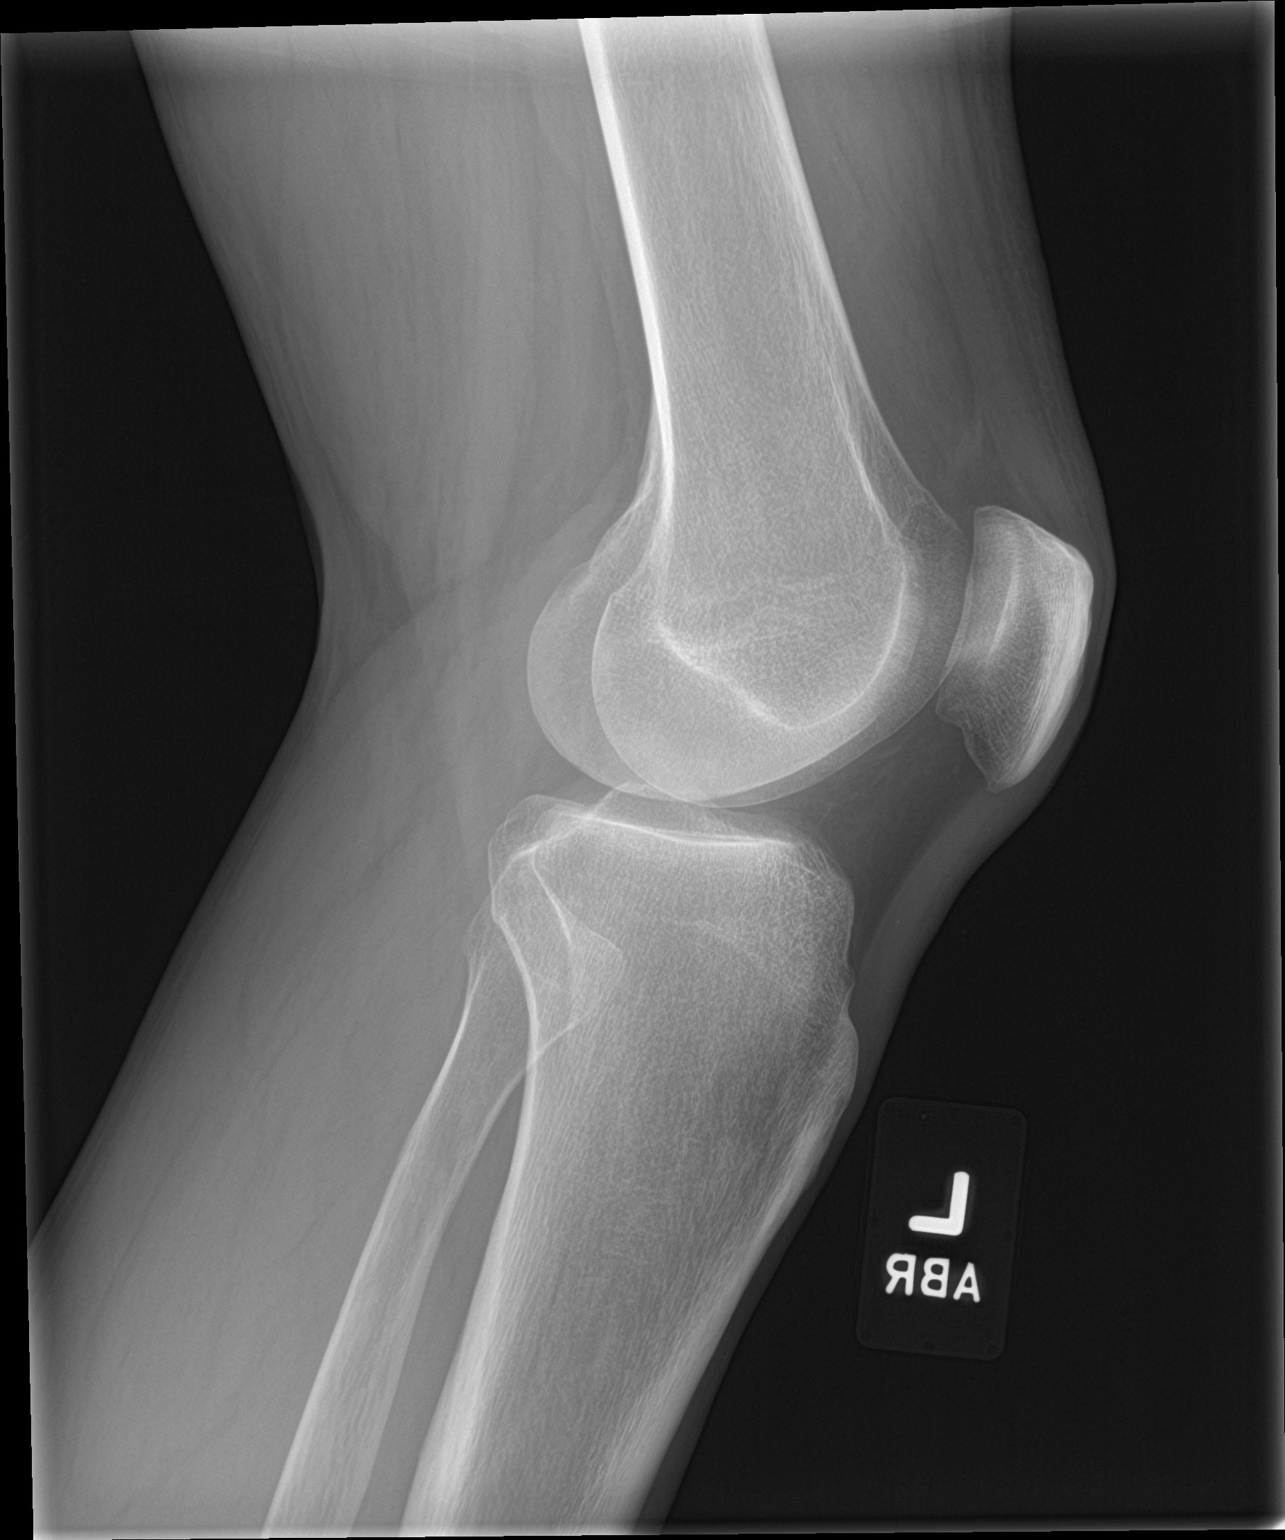

[2 of 2 positions shown; findings below may reference images not displayed]

FINDINGS: Osseous mineralization normal.

Joint spaces preserved.

No fracture, dislocation, or bone destruction.

No joint effusion.
IMPRESSION: Normal exam.

## 2019-02-25 ENCOUNTER — Other Ambulatory Visit: Payer: Self-pay

## 2019-02-25 ENCOUNTER — Encounter (INDEPENDENT_AMBULATORY_CARE_PROVIDER_SITE_OTHER): Payer: Self-pay | Admitting: Gastroenterology

## 2019-02-25 ENCOUNTER — Ambulatory Visit (INDEPENDENT_AMBULATORY_CARE_PROVIDER_SITE_OTHER): Payer: Medicaid Other | Admitting: Gastroenterology

## 2019-02-25 VITALS — BP 144/94 | HR 86 | Temp 97.3°F | Ht 74.0 in | Wt 219.1 lb

## 2019-02-25 DIAGNOSIS — K219 Gastro-esophageal reflux disease without esophagitis: Secondary | ICD-10-CM

## 2019-02-25 DIAGNOSIS — Z800 Family history of malignant neoplasm of digestive organs: Secondary | ICD-10-CM

## 2019-02-25 DIAGNOSIS — Z8619 Personal history of other infectious and parasitic diseases: Secondary | ICD-10-CM | POA: Diagnosis not present

## 2019-02-25 DIAGNOSIS — K7400 Hepatic fibrosis, unspecified: Secondary | ICD-10-CM | POA: Diagnosis not present

## 2019-02-25 DIAGNOSIS — Z720 Tobacco use: Secondary | ICD-10-CM

## 2019-02-25 NOTE — Patient Instructions (Addendum)
Try a fiber supplement such Benefiber or Citrucel fiber supplement for regularity.   -continue omeprazole for reflux, avoid spicy greasy foods etc  -try to decrease tobacco intake daily   -need colonoscopy around age 44, sooner if develops symptoms

## 2019-02-25 NOTE — Progress Notes (Signed)
Patient profile: Oscar Cox is a 44 y.o. male seen for f/up of history of hepatitis C.   History of Present Illness: Oscar Cox has past medical history of GERD, remote IV drug use, chronic hepatitis C GT 1a treated with Mavyret x 8 weeks 01/24/2017.   He is seen today for follow-up.  He reports he is doing well, he is on omeprazole 20 mg for GERD, as long as he takes it he has no symptoms.  He is symptomatic without his PPI.  He denies any nausea vomiting or dysphagia.  He typically has a bowel movement every couple days.  Does have some straining at times. This is his chronic baseline.  Reports he probably does not get enough fiber or fluid.  No rectal bleeding or melena or abdominal pain.  Wt Readings from Last 3 Encounters:  02/25/19 219 lb 1.6 oz (99.4 kg)  09/24/18 222 lb 14.4 oz (101.1 kg)  09/22/17 192 lb 6.4 oz (87.3 kg)     Last Colonoscopy: none prior Last Endoscopy: none prior    Past Medical History:  Past Medical History:  Diagnosis Date  . Acid reflux   . Essential hypertension, benign 11/27/2016  . Hepatitis C antibody test positive 11/27/2016  . High cholesterol 11/27/2016    Problem List: Patient Active Problem List   Diagnosis Date Noted  . Hepatitis C virus infection cured after antiviral drug therapy 09/24/2018  . Hepatitis C antibody test positive 11/27/2016  . Essential hypertension, benign 11/27/2016  . High cholesterol 11/27/2016    Past Surgical History: History reviewed. No pertinent surgical history.  Allergies: Allergies  Allergen Reactions  . K-Dur [Potassium Chloride] Nausea And Vomiting  . Tamiflu [Oseltamivir Phosphate] Hives      Home Medications:  Current Outpatient Medications:  .  cetirizine (ZYRTEC) 10 MG tablet, Take 10 mg by mouth daily. , Disp: , Rfl:  .  fluticasone (FLONASE) 50 MCG/ACT nasal spray, Place 2 sprays into both nostrils daily. , Disp: , Rfl:  .  lisinopril (PRINIVIL,ZESTRIL) 10 MG tablet, Take  10 mg daily by mouth., Disp: , Rfl:  .  omeprazole (PRILOSEC OTC) 20 MG tablet, Take 20 mg by mouth daily., Disp: , Rfl:  .  gabapentin (NEURONTIN) 400 MG capsule, Take 400 mg by mouth 3 (three) times daily. , Disp: , Rfl:    Family History: Father - colon cancer, diagnosed around early 80s.  Mother - colon polyps (larger polyp needing surgical removal) - age 61     Social History:   reports that he has been smoking. He has never used smokeless tobacco. He reports current drug use. Drug: Marijuana. He reports that he does not drink alcohol.  Smokes 1 PPD  Alcohol - few times a month, 1 pint per serving    Review of Systems: Constitutional: Denies weight loss/weight gain  Eyes: No changes in vision. ENT: No oral lesions, sore throat.  GI: see HPI.  Heme/Lymph: No easy bruising.  CV: No chest pain.  GU: No hematuria.  Integumentary: No rashes.  Neuro: No headaches.  Psych: No depression/anxiety.  Endocrine: No heat/cold intolerance.  Allergic/Immunologic: No urticaria.  Resp: No cough, SOB.  Musculoskeletal: No joint swelling.    Physical Examination: BP (!) 144/94 (BP Location: Right Arm, Patient Position: Sitting, Cuff Size: Large)   Pulse 86   Temp (!) 97.3 F (36.3 C) (Temporal)   Ht 6\' 2"  (1.88 m)   Wt 219 lb 1.6 oz (99.4 kg)  BMI 28.13 kg/m  Gen: NAD, alert and oriented x 4 HEENT: PEERLA, EOMI, Neck: supple, no JVD Chest: CTA bilaterally, no wheezes, crackles, or other adventitious sounds CV: RRR, no m/g/c/r Abd: soft, NT, ND, +BS in all four quadrants; no HSM, guarding, ridigity, or rebound tenderness Ext: no edema, well perfused with 2+ pulses, Skin: no rash or lesions noted on observed skin Lymph: no noted LAD  Data Reviewed:  Fibroscan 11/2016-F2 +some F3.  Moderate risk of fibrosis  IMPRESSION: [place] 2019 Cholelithiasis without evidence of acute cholecystitis.  Diffusely increased parenchymal echogenicity of the liver usually associated with hepatic  steatosis or fibrosis.   September 2020-HCV RNA 183, quantitative log 2.26.  LFTS w/ ALT 47, otherwise normal.    October 2020-HCV RNA not detected.  LFTs with ALT of 49, otherwise normal   09/2017--HCV RNA not detected. LFTS normal    Assessment/Plan: Mr. Furtick is a 44 y.o. male   1.  History of hepatitis C-he had an undetectable viral load in 2019 with detectable viral load September 2020.  This was repeated October 2020 was undetected.  We specifically discussed risk factors for possible new exposures but he denies any of these.  We will repeat his labs today for further clarification.  He had a history of F2 to F3 on initial FibroScan at diagnosis, hopefully has had regression of fibrosis after treatment, will plan to repeat FibroScan.  If has F3 or F4 fibrosis will need long-term [place] monitoring.  2.  GERD-well-controlled on omeprazole.  No upper GI symptoms.  3.  Family history of colon cancer in his father in his early-mid 18s with his mother currently in 27s and having large colon polyp surgically removed.  We discussed early colon cancer screening with colonoscopy around age 69.  32. Constipation - mild chronic, recommend fiber supplement and increasing fluid intake.   We discussed decreasing his tobacco intake during visit.  Currently smoking a pack a day.  Also needs to abstain from alcohol which is more social use.   Knight was seen today for follow-up.  Diagnoses and all orders for this visit:  History of hepatitis C -     US ABDOMEN RUQ W/ELASTOGRAPHY; Future -     Hepatic function panel -     HCV RNA BY PCR, QN RFX GENO -     CBC with Differential -     INR/PT  Hepatic fibrosis -     US ABDOMEN RUQ W/ELASTOGRAPHY; Future -     Hepatic function panel -     HCV RNA BY PCR, QN RFX GENO -     CBC with Differential -     INR/PT  Family history of colon cancer  Chronic GERD  Continuous tobacco abuse    25 min total pt care. >50% face to  face  Recommendations: f/up depending on lab and [place] results    I personally performed the service, non-incident to. (WP)  Laurine Blazer, [facility]

## 2019-02-28 LAB — CBC WITH DIFFERENTIAL/PLATELET
Absolute Monocytes: 625 cells/uL (ref 200–950)
Basophils Absolute: 53 cells/uL (ref 0–200)
Basophils Relative: 0.5 %
Eosinophils Absolute: 297 cells/uL (ref 15–500)
Eosinophils Relative: 2.8 %
HCT: 47.1 % (ref 38.5–50.0)
Hemoglobin: 16.4 g/dL (ref 13.2–17.1)
Lymphs Abs: 3371 cells/uL (ref 850–3900)
MCH: 31.1 pg (ref 27.0–33.0)
MCHC: 34.8 g/dL (ref 32.0–36.0)
MCV: 89.4 fL (ref 80.0–100.0)
MPV: 11.5 fL (ref 7.5–12.5)
Monocytes Relative: 5.9 %
Neutro Abs: 6254 cells/uL (ref 1500–7800)
Neutrophils Relative %: 59 %
Platelets: 188 10*3/uL (ref 140–400)
RBC: 5.27 10*6/uL (ref 4.20–5.80)
RDW: 12.5 % (ref 11.0–15.0)
Total Lymphocyte: 31.8 %
WBC: 10.6 10*3/uL (ref 3.8–10.8)

## 2019-02-28 LAB — HEPATIC FUNCTION PANEL
AG Ratio: 1.6 (calc) (ref 1.0–2.5)
ALT: 34 U/L (ref 9–46)
AST: 23 U/L (ref 10–40)
Albumin: 4.3 g/dL (ref 3.6–5.1)
Alkaline phosphatase (APISO): 52 U/L (ref 36–130)
Bilirubin, Direct: 0.1 mg/dL (ref 0.0–0.2)
Globulin: 2.7 g/dL (calc) (ref 1.9–3.7)
Indirect Bilirubin: 0.6 mg/dL (calc) (ref 0.2–1.2)
Total Bilirubin: 0.7 mg/dL (ref 0.2–1.2)
Total Protein: 7 g/dL (ref 6.1–8.1)

## 2019-02-28 LAB — PROTIME-INR
INR: 1
Prothrombin Time: 10.4 s (ref 9.0–11.5)

## 2019-02-28 LAB — HCV RNA,QN PCR RFLX GENO, LIPABAD
HCV RNA, PCR, QN (Log): <1.18 log IU/mL
HCV RNA, PCR, QN: <15 IU/mL

## 2019-03-03 ENCOUNTER — Other Ambulatory Visit: Payer: Self-pay

## 2019-03-03 ENCOUNTER — Ambulatory Visit (HOSPITAL_COMMUNITY)
Admission: RE | Admit: 2019-03-03 | Discharge: 2019-03-03 | Disposition: A | Discharge status: Home / Self Care | Payer: Medicaid Other | Source: Ambulatory Visit | Attending: Gastroenterology | Admitting: Gastroenterology

## 2019-03-03 DIAGNOSIS — K7400 Hepatic fibrosis, unspecified: Secondary | ICD-10-CM | POA: Insufficient documentation

## 2019-03-03 DIAGNOSIS — Z8619 Personal history of other infectious and parasitic diseases: Secondary | ICD-10-CM | POA: Insufficient documentation

## 2019-03-03 DIAGNOSIS — K8020 Calculus of gallbladder without cholecystitis without obstruction: Secondary | ICD-10-CM | POA: Diagnosis not present

## 2019-03-11 ENCOUNTER — Other Ambulatory Visit: Payer: Self-pay

## 2019-03-12 ENCOUNTER — Ambulatory Visit: Payer: Medicaid Other | Admitting: Family Medicine

## 2019-03-12 ENCOUNTER — Encounter: Payer: Self-pay | Admitting: Family Medicine

## 2019-03-12 VITALS — BP 147/89 | HR 87 | Temp 96.8°F | Ht 74.0 in | Wt 223.0 lb

## 2019-03-12 DIAGNOSIS — E1149 Type 2 diabetes mellitus with other diabetic neurological complication: Secondary | ICD-10-CM

## 2019-03-12 DIAGNOSIS — K219 Gastro-esophageal reflux disease without esophagitis: Secondary | ICD-10-CM

## 2019-03-12 DIAGNOSIS — E1140 Type 2 diabetes mellitus with diabetic neuropathy, unspecified: Secondary | ICD-10-CM

## 2019-03-12 DIAGNOSIS — Z23 Encounter for immunization: Secondary | ICD-10-CM | POA: Diagnosis not present

## 2019-03-12 DIAGNOSIS — I10 Essential (primary) hypertension: Secondary | ICD-10-CM | POA: Diagnosis not present

## 2019-03-12 DIAGNOSIS — E119 Type 2 diabetes mellitus without complications: Secondary | ICD-10-CM | POA: Diagnosis not present

## 2019-03-12 DIAGNOSIS — E7800 Pure hypercholesterolemia, unspecified: Secondary | ICD-10-CM | POA: Diagnosis not present

## 2019-03-12 DIAGNOSIS — Z8619 Personal history of other infectious and parasitic diseases: Secondary | ICD-10-CM | POA: Diagnosis not present

## 2019-03-12 DIAGNOSIS — E782 Mixed hyperlipidemia: Secondary | ICD-10-CM

## 2019-03-12 DIAGNOSIS — J302 Other seasonal allergic rhinitis: Secondary | ICD-10-CM | POA: Diagnosis not present

## 2019-03-12 LAB — BAYER DCA HB A1C WAIVED: HB A1C (BAYER DCA - WAIVED): 10.9 % — ABNORMAL HIGH (ref <7.0)

## 2019-03-12 MED ORDER — CETIRIZINE HCL 10 MG PO TABS: 10.0000 mg | ORAL_TABLET | Freq: Every day | ORAL | 1 refills | Status: DC

## 2019-03-12 MED ORDER — OMEPRAZOLE MAGNESIUM 20 MG PO TBEC: 20.0000 mg | DELAYED_RELEASE_TABLET | Freq: Every day | ORAL | 1 refills | Status: DC

## 2019-03-12 MED ORDER — METFORMIN HCL ER 500 MG PO TB24: ORAL_TABLET | ORAL | 1 refills | Status: DC

## 2019-03-12 MED ORDER — TETANUS-DIPHTH-ACELL PERTUSSIS 5-2.5-18.5 LF-MCG/0.5 IM SUSP: 0.5000 mL | Freq: Once | INTRAMUSCULAR | 0 refills | Status: AC

## 2019-03-12 MED ORDER — FLUTICASONE PROPIONATE 50 MCG/ACT NA SUSP: 2.0000 | Freq: Every day | NASAL | 5 refills | Status: DC

## 2019-03-12 MED ORDER — GABAPENTIN 400 MG PO CAPS: 400.0000 mg | ORAL_CAPSULE | Freq: Three times a day (TID) | ORAL | 1 refills | Status: DC

## 2019-03-12 MED ORDER — LISINOPRIL 20 MG PO TABS: 10.0000 mg | ORAL_TABLET | Freq: Every day | ORAL | 2 refills | Status: DC

## 2019-03-12 MED ORDER — PRAVASTATIN SODIUM 40 MG PO TABS: 40.0000 mg | ORAL_TABLET | Freq: Every day | ORAL | 1 refills | Status: DC

## 2019-03-12 NOTE — Patient Instructions (Signed)

## 2019-03-12 NOTE — Progress Notes (Signed)
New Patient Office Visit  Assessment & Plan:  1. Type 2 diabetes mellitus with diabetic neuropathy, without long-term current use of insulin (HCC) Lab Results  Component Value Date   HGBA1C 10.9 (H) 03/12/2019  - Diabetes is not at goal of A1c < 7. - Medications: started patient on metformin XR 500 mg PO QD with instructions to increase by 500 mg every week until he is taking 1,000 mg BID - Patient is currently taking a statin. Patient is taking an ACE-inhibitor/ARB.  - Last diabetic eye exam: >1 year ago, but < 2 years ago; patient reports his insurance following pay for an eye exam every 2 years - Urine Microalbumin/Creat Ratio: 03/12/2019 - Instruction/counseling given: discussed diet and provided printed educational material - Bayer DCA Hb A1c Waived - Microalbumin / creatinine urine ratio - BMP8+EGFR - Lipid panel - pravastatin (PRAVACHOL) 40 MG tablet; Take 1 tablet (40 mg total) by mouth daily.  Dispense: 90 tablet; Refill: 1 - metFORMIN (GLUCOPHAGE XR) 500 MG 24 hr tablet; Start 500 mg by mouth once daily x1 week, increase by 500 mg every week until 1,000 mg twice daily.  Dispense: 360 tablet; Refill: 1  2. Diabetic neuropathy with neurologic complication ([place]) - Well controlled on current regimen.  - gabapentin (NEURONTIN) 400 MG capsule; Take 1 capsule (400 mg total) by mouth 3 (three) times daily.  Dispense: 270 capsule; Refill: 1  3. Essential hypertension, benign - Uncontrolled.  Lisinopril increased from 10 mg to 20 mg once daily. - lisinopril (ZESTRIL) 20 MG tablet; Take 1 tablet (20 mg total) by mouth daily.  Dispense: 30 tablet; Refill: 2  4. Mixed hyperlipidemia - Lipid panel - pravastatin (PRAVACHOL) 40 MG tablet; Take 1 tablet (40 mg total) by mouth daily.  Dispense: 90 tablet; Refill: 1  5. Gastroesophageal reflux disease without esophagitis - Well controlled on current regimen.  - omeprazole (PRILOSEC OTC) 20 MG tablet; Take 1 tablet (20 mg total) by mouth  daily.  Dispense: 90 tablet; Refill: 1  6. Seasonal allergies - Well controlled on current regimen.  - cetirizine (ZYRTEC) 10 MG tablet; Take 1 tablet (10 mg total) by mouth daily.  Dispense: 90 tablet; Refill: 1 - fluticasone (FLONASE) 50 MCG/ACT nasal spray; Place 2 sprays into both nostrils daily.  Dispense: 16 g; Refill: 5  7. History of hepatitis C - Previously treated.  Managed by gastroenterology.  8. Immunization due - Tdap (BOOSTRIX) 5-2.5-18.5 LF-MCG/0.5 injection; Inject 0.5 mLs into the muscle once for 1 dose.  Dispense: 0.5 mL; Refill: 0   Follow-up: Return in about 4 months (around 07/10/2019) for DM, HTN.   Hendricks Limes, MSN, APRN, FNP-C [facility]  Subjective:  Patient ID: Oscar Cox, male    DOB: 1975/08/17  Age: 44 y.o. MRN: [id number]  Patient Care Team: Loman Brooklyn, FNP as PCP - General (Family Medicine) Lissa Hoard Karie Schwalbe, NP as Registered Nurse (Adult Health Nurse Practitioner)  CC:  Chief Complaint  Patient presents with  . New Patient (Initial Visit)    HPI Oscar Cox presents to establish care. He is transferring care from Dr. Murrell Redden office as he has retired and the office has closed.    Patient has a history of hepatitis C which has been treated with antivirals.  Patient reports he has seen gastroenterology recently and his lab work and ultrasound looked good.  Patient has a history of hypertension.  He has a prescription for lisinopril 10 mg once daily which he  reports he has been taking.  Also states his blood pressure continues to run high even outside of the office with the 10 mg.  Diabetes: Patient presents for follow up of diabetes. Current symptoms include: paresthesia of the feet. Known diabetic complications: peripheral neuropathy. Medication compliance: Patient has not been taking anything for his diabetes.  He reports he used to take regular Metformin which caused diarrhea.  He states he was  able to come off of this because his A1c had gotten below 7.0.. Current diet: in general, an "unhealthy" diet. Current exercise: none. Is he  on ACE inhibitor or angiotensin II receptor blocker? Yes. Is he on a statin? No.   Lab Results  Component Value Date   HGBA1C 10.9 (H) 03/12/2019   Lab Results  Component Value Date   LDLCALC 97 03/12/2019   CREATININE 0.90 03/12/2019     Review of Systems  Constitutional: Negative for chills, fever, malaise/fatigue and weight loss.  HENT: Negative for congestion, ear discharge, ear pain, nosebleeds, sinus pain, sore throat and tinnitus.   Eyes: Negative for blurred vision, double vision, pain, discharge and redness.  Respiratory: Negative for cough, shortness of breath and wheezing.   Cardiovascular: Negative for chest pain, palpitations and leg swelling.  Gastrointestinal: Negative for abdominal pain, constipation, diarrhea, heartburn, nausea and vomiting.  Genitourinary: Negative for dysuria, frequency and urgency.  Musculoskeletal: Negative for myalgias.  Skin: Negative for rash.  Neurological: Negative for dizziness, seizures, weakness and headaches.  Psychiatric/Behavioral: Negative for depression, substance abuse and suicidal ideas. The patient is not nervous/anxious.     Current Outpatient Medications:  .  cetirizine (ZYRTEC) 10 MG tablet, Take 1 tablet (10 mg total) by mouth daily., Disp: 90 tablet, Rfl: 1 .  fluticasone (FLONASE) 50 MCG/ACT nasal spray, Place 2 sprays into both nostrils daily., Disp: 16 g, Rfl: 5 .  gabapentin (NEURONTIN) 400 MG capsule, Take 1 capsule (400 mg total) by mouth 3 (three) times daily., Disp: 270 capsule, Rfl: 1 .  lisinopril (ZESTRIL) 20 MG tablet, Take 0.5 tablets (10 mg total) by mouth daily., Disp: 30 tablet, Rfl: 2 .  omeprazole (PRILOSEC OTC) 20 MG tablet, Take 1 tablet (20 mg total) by mouth daily., Disp: 90 tablet, Rfl: 1 .  metFORMIN (GLUCOPHAGE XR) 500 MG 24 hr tablet, Start 500 mg by mouth once  daily x1 week, increase by 500 mg every week until 1,000 mg twice daily., Disp: 360 tablet, Rfl: 1 .  pravastatin (PRAVACHOL) 40 MG tablet, Take 1 tablet (40 mg total) by mouth daily., Disp: 90 tablet, Rfl: 1  Allergies  Allergen Reactions  . Tamiflu [Oseltamivir Phosphate] Hives  . Morphine Nausea Only  . Potassium Chloride Nausea And Vomiting    Past Medical History:  Diagnosis Date  . Acid reflux   . Diabetes mellitus without complication ([place])   . Essential hypertension, benign 11/27/2016  . Hepatitis C antibody test positive 11/27/2016  . Hepatitis C virus infection cured after antiviral drug therapy 09/24/2018  . High cholesterol 11/27/2016    Past Surgical History:  Procedure Laterality Date  . NO PAST SURGERIES      Family History  Problem Relation Age of Onset  . Hypertension Mother   . Breast cancer Mother   . Diabetes Father   . Heart disease Father   . Hypertension Father   . Colon cancer Father   . Diabetes Brother   . Hypertension Brother     Social History   Socioeconomic History  . Marital  status: Single    Spouse name: Not on file  . Number of children: Not on file  . Years of education: Not on file  . Highest education level: Not on file  Occupational History  . Not on file  Tobacco Use  . Smoking status: Current Every Day Smoker  . Smokeless tobacco: Never Used  Substance and Sexual Activity  . Alcohol use: No  . Drug use: Yes    Types: Marijuana  . Sexual activity: Not on file  Other Topics Concern  . Not on file  Social History Narrative  . Not on file   Social Determinants of Health   Financial Resource Strain:   . Difficulty of Paying Living Expenses: Not on file  Food Insecurity:   . Worried About Charity fundraiser in the Last Year: Not on file  . Ran Out of Food in the Last Year: Not on file  Transportation Needs:   . Lack of Transportation (Medical): Not on file  . Lack of Transportation (Non-Medical): Not on file   Physical Activity:   . Days of Exercise per Week: Not on file  . Minutes of Exercise per Session: Not on file  Stress:   . Feeling of Stress : Not on file  Social Connections:   . Frequency of Communication with Friends and Family: Not on file  . Frequency of Social Gatherings with Friends and Family: Not on file  . Attends Religious Services: Not on file  . Active Member of Clubs or Organizations: Not on file  . Attends Archivist Meetings: Not on file  . Marital Status: Not on file  Intimate Partner Violence:   . Fear of Current or Ex-Partner: Not on file  . Emotionally Abused: Not on file  . Physically Abused: Not on file  . Sexually Abused: Not on file    Objective:   Today's Vitals: BP (!) 147/89   Pulse 87   Temp (!) 96.8 F (36 C) (Oral)   Ht 6' 2" (1.88 m)   Wt 223 lb (101.2 kg)   BMI 28.63 kg/m   Physical Exam Vitals reviewed.  Constitutional:      General: He is not in acute distress.    Appearance: Normal appearance. He is overweight. He is not ill-appearing, toxic-appearing or diaphoretic.  HENT:     Head: Normocephalic and atraumatic.  Eyes:     General: No scleral icterus.       Right eye: No discharge.        Left eye: No discharge.     Conjunctiva/sclera: Conjunctivae normal.  Cardiovascular:     Rate and Rhythm: Normal rate and regular rhythm.     Heart sounds: Normal heart sounds. No murmur. No friction rub. No gallop.   Pulmonary:     Effort: Pulmonary effort is normal. No respiratory distress.     Breath sounds: Normal breath sounds. No stridor. No wheezing, rhonchi or rales.  Musculoskeletal:        General: Normal range of motion.     Cervical back: Normal range of motion.  Skin:    General: Skin is warm and dry.  Neurological:     Mental Status: He is alert and oriented to person, place, and time. Mental status is at baseline.  Psychiatric:        Mood and Affect: Mood normal.        Behavior: Behavior normal.        Thought  Content: Thought content  normal.        Judgment: Judgment normal.

## 2019-03-13 LAB — BMP8+EGFR
BUN/Creatinine Ratio: 17 (ref 9–20)
BUN: 15 mg/dL (ref 6–24)
CO2: 22 mmol/L (ref 20–29)
Calcium: 9.6 mg/dL (ref 8.7–10.2)
Chloride: 98 mmol/L (ref 96–106)
Creatinine, Ser: 0.9 mg/dL (ref 0.76–1.27)
GFR calc Af Amer: 121 mL/min/{1.73_m2} (ref >59)
GFR calc non Af Amer: 104 mL/min/{1.73_m2} (ref >59)
Glucose: 291 mg/dL — ABNORMAL HIGH (ref 65–99)
Potassium: 4.2 mmol/L (ref 3.5–5.2)
Sodium: 138 mmol/L (ref 134–144)

## 2019-03-13 LAB — LIPID PANEL
Chol/HDL Ratio: 8.8 ratio — ABNORMAL HIGH (ref 0.0–5.0)
Cholesterol, Total: 230 mg/dL — ABNORMAL HIGH (ref 100–199)
HDL: 26 mg/dL — ABNORMAL LOW (ref >39)
LDL Chol Calc (NIH): 97 mg/dL (ref 0–99)
Triglycerides: 633 mg/dL (ref 0–149)
VLDL Cholesterol Cal: 107 mg/dL — ABNORMAL HIGH (ref 5–40)

## 2019-03-13 LAB — MICROALBUMIN / CREATININE URINE RATIO
Creatinine, Urine: 205.6 mg/dL
Microalb/Creat Ratio: 14 mg/g creat (ref 0–29)
Microalbumin, Urine: 28.7 ug/mL

## 2019-03-15 ENCOUNTER — Telehealth: Payer: Self-pay | Admitting: Family Medicine

## 2019-03-15 DIAGNOSIS — E785 Hyperlipidemia, unspecified: Secondary | ICD-10-CM

## 2019-03-15 DIAGNOSIS — E1140 Type 2 diabetes mellitus with diabetic neuropathy, unspecified: Secondary | ICD-10-CM

## 2019-03-15 DIAGNOSIS — E1169 Type 2 diabetes mellitus with other specified complication: Secondary | ICD-10-CM

## 2019-03-15 MED ORDER — LISINOPRIL 20 MG PO TABS: 20.0000 mg | ORAL_TABLET | Freq: Every day | ORAL | 2 refills | Status: DC

## 2019-03-15 MED ORDER — GLIMEPIRIDE 2 MG PO TABS: ORAL_TABLET | ORAL | 3 refills | Status: DC

## 2019-03-15 NOTE — Telephone Encounter (Signed)
Started Metformin 500mg  on Sat. Diarrhea started that pm. Can have 4 loose stools back to back. This happens frequently. Pt did not take medication today because he is afraid to.

## 2019-03-15 NOTE — Telephone Encounter (Signed)
Oscar Cox can addres upon her return to office

## 2019-03-15 NOTE — Telephone Encounter (Signed)
Patient aware and verbalizes understanding. 

## 2019-03-15 NOTE — Telephone Encounter (Signed)
I discontinued metformin. He may stop it. I sent a new prescription for glimepiride that he can start at 2 mg once daily, then increase to 4 mg (2 tablets) once daily after 2 weeks.

## 2019-04-21 ENCOUNTER — Encounter: Payer: Self-pay | Admitting: *Deleted

## 2019-06-08 ENCOUNTER — Other Ambulatory Visit: Payer: Self-pay | Admitting: Family Medicine

## 2019-06-08 DIAGNOSIS — I10 Essential (primary) hypertension: Secondary | ICD-10-CM

## 2019-07-09 ENCOUNTER — Ambulatory Visit: Payer: Medicaid Other | Admitting: Family Medicine

## 2019-07-09 ENCOUNTER — Encounter: Payer: Self-pay | Admitting: Family Medicine

## 2019-07-09 ENCOUNTER — Other Ambulatory Visit: Payer: Self-pay

## 2019-07-09 VITALS — BP 140/92 | HR 90 | Temp 97.4°F | Ht 74.0 in | Wt 228.4 lb

## 2019-07-09 DIAGNOSIS — E782 Mixed hyperlipidemia: Secondary | ICD-10-CM | POA: Diagnosis not present

## 2019-07-09 DIAGNOSIS — K219 Gastro-esophageal reflux disease without esophagitis: Secondary | ICD-10-CM | POA: Diagnosis not present

## 2019-07-09 DIAGNOSIS — I10 Essential (primary) hypertension: Secondary | ICD-10-CM | POA: Diagnosis not present

## 2019-07-09 DIAGNOSIS — Z808 Family history of malignant neoplasm of other organs or systems: Secondary | ICD-10-CM | POA: Diagnosis not present

## 2019-07-09 DIAGNOSIS — J302 Other seasonal allergic rhinitis: Secondary | ICD-10-CM

## 2019-07-09 DIAGNOSIS — E1149 Type 2 diabetes mellitus with other diabetic neurological complication: Secondary | ICD-10-CM | POA: Diagnosis not present

## 2019-07-09 DIAGNOSIS — E1140 Type 2 diabetes mellitus with diabetic neuropathy, unspecified: Secondary | ICD-10-CM | POA: Diagnosis not present

## 2019-07-09 DIAGNOSIS — E785 Hyperlipidemia, unspecified: Secondary | ICD-10-CM | POA: Diagnosis not present

## 2019-07-09 DIAGNOSIS — E1169 Type 2 diabetes mellitus with other specified complication: Secondary | ICD-10-CM | POA: Diagnosis not present

## 2019-07-09 LAB — BAYER DCA HB A1C WAIVED: HB A1C (BAYER DCA - WAIVED): 7.9 % — ABNORMAL HIGH (ref <7.0)

## 2019-07-09 MED ORDER — OMEPRAZOLE MAGNESIUM 20 MG PO TBEC: 20.0000 mg | DELAYED_RELEASE_TABLET | Freq: Every day | ORAL | 1 refills | Status: DC

## 2019-07-09 MED ORDER — EMPAGLIFLOZIN 25 MG PO TABS
25.0000 mg | ORAL_TABLET | Freq: Every day | ORAL | 2 refills | Status: DC
Start: 2019-07-09 — End: 2019-10-11

## 2019-07-09 MED ORDER — CETIRIZINE HCL 10 MG PO TABS: 10.0000 mg | ORAL_TABLET | Freq: Every day | ORAL | 1 refills | Status: DC

## 2019-07-09 MED ORDER — PRAVASTATIN SODIUM 40 MG PO TABS: 40.0000 mg | ORAL_TABLET | Freq: Every day | ORAL | 1 refills | Status: DC

## 2019-07-09 MED ORDER — LISINOPRIL 40 MG PO TABS: 40.0000 mg | ORAL_TABLET | Freq: Every day | ORAL | 2 refills | Status: DC

## 2019-07-09 MED ORDER — GABAPENTIN 400 MG PO CAPS: 400.0000 mg | ORAL_CAPSULE | Freq: Three times a day (TID) | ORAL | 1 refills | Status: DC

## 2019-07-09 NOTE — Patient Instructions (Addendum)
Stop glimepiride. Start Jardiance samples of 10 mg x2 weeks, then increase to the 25 mg prescription I sent in.   Diabetes Mellitus and Nutrition, Adult When you have diabetes (diabetes mellitus), it is very important to have healthy eating habits because your blood sugar (glucose) levels are greatly affected by what you eat and drink. Eating healthy foods in the appropriate amounts, at about the same times every day, can help you:  Control your blood glucose.  Lower your risk of heart disease.  Improve your blood pressure.  Reach or maintain a healthy weight. Every person with diabetes is different, and each person has different needs for a meal plan. Your health care provider may recommend that you work with a diet and nutrition specialist (dietitian) to make a meal plan that is best for you. Your meal plan may vary depending on factors such as:  The calories you need.  The medicines you take.  Your weight.  Your blood glucose, blood pressure, and cholesterol levels.  Your activity level.  Other health conditions you have, such as heart or kidney disease. How do carbohydrates affect me? Carbohydrates, also called carbs, affect your blood glucose level more than any other type of food. Eating carbs naturally raises the amount of glucose in your blood. Carb counting is a method for keeping track of how many carbs you eat. Counting carbs is important to keep your blood glucose at a healthy level, especially if you use insulin or take certain oral diabetes medicines. It is important to know how many carbs you can safely have in each meal. This is different for every person. Your dietitian can help you calculate how many carbs you should have at each meal and for each snack. Foods that contain carbs include:  Bread, cereal, rice, pasta, and crackers.  Potatoes and corn.  Peas, beans, and lentils.  Milk and yogurt.  Fruit and juice.  Desserts, such as cakes, cookies, ice cream,  and candy. How does alcohol affect me? Alcohol can cause a sudden decrease in blood glucose (hypoglycemia), especially if you use insulin or take certain oral diabetes medicines. Hypoglycemia can be a life-threatening condition. Symptoms of hypoglycemia (sleepiness, dizziness, and confusion) are similar to symptoms of having too much alcohol. If your health care provider says that alcohol is safe for you, follow these guidelines:  Limit alcohol intake to no more than 1 drink per day for nonpregnant women and 2 drinks per day for men. One drink equals 12 oz of beer, 5 oz of wine, or 1 oz of hard liquor.  Do not drink on an empty stomach.  Keep yourself hydrated with water, diet soda, or unsweetened iced tea.  Keep in mind that regular soda, juice, and other mixers may contain a lot of sugar and must be counted as carbs. What are tips for following this plan?  Reading food labels  Start by checking the serving size on the "Nutrition Facts" label of packaged foods and drinks. The amount of calories, carbs, fats, and other nutrients listed on the label is based on one serving of the item. Many items contain more than one serving per package.  Check the total grams (g) of carbs in one serving. You can calculate the number of servings of carbs in one serving by dividing the total carbs by 15. For example, if a food has 30 g of total carbs, it would be equal to 2 servings of carbs.  Check the number of grams (g) of saturated  and trans fats in one serving. Choose foods that have low or no amount of these fats.  Check the number of milligrams (mg) of salt (sodium) in one serving. Most people should limit total sodium intake to less than 2,300 mg per day.  Always check the nutrition information of foods labeled as "low-fat" or "nonfat". These foods may be higher in added sugar or refined carbs and should be avoided.  Talk to your dietitian to identify your daily goals for nutrients listed on the  label. Shopping  Avoid buying canned, premade, or processed foods. These foods tend to be high in fat, sodium, and added sugar.  Shop around the outside edge of the grocery store. This includes fresh fruits and vegetables, bulk grains, fresh meats, and fresh dairy. Cooking  Use low-heat cooking methods, such as baking, instead of high-heat cooking methods like deep frying.  Cook using healthy oils, such as olive, canola, or sunflower oil.  Avoid cooking with butter, cream, or high-fat meats. Meal planning  Eat meals and snacks regularly, preferably at the same times every day. Avoid going long periods of time without eating.  Eat foods high in fiber, such as fresh fruits, vegetables, beans, and whole grains. Talk to your dietitian about how many servings of carbs you can eat at each meal.  Eat 4-6 ounces (oz) of lean protein each day, such as lean meat, chicken, fish, eggs, or tofu. One oz of lean protein is equal to: ? 1 oz of meat, chicken, or fish. ? 1 egg. ?  cup of tofu.  Eat some foods each day that contain healthy fats, such as avocado, nuts, seeds, and fish. Lifestyle  Check your blood glucose regularly.  Exercise regularly as told by your health care provider. This may include: ? 150 minutes of moderate-intensity or vigorous-intensity exercise each week. This could be brisk walking, biking, or water aerobics. ? Stretching and doing strength exercises, such as yoga or weightlifting, at least 2 times a week.  Take medicines as told by your health care provider.  Do not use any products that contain nicotine or tobacco, such as cigarettes and e-cigarettes. If you need help quitting, ask your health care provider.  Work with a Veterinary surgeon or diabetes educator to identify strategies to manage stress and any emotional and social challenges. Questions to ask a health care provider  Do I need to meet with a diabetes educator?  Do I need to meet with a dietitian?  What  number can I call if I have questions?  When are the best times to check my blood glucose? Where to find more information:  American Diabetes Association: diabetes.org  Academy of Nutrition and Dietetics: www.eatright.[employer] of Diabetes and Digestive and Kidney Diseases (NIH): CarFli[employer].tn Summary  A healthy meal plan will help you control your blood glucose and maintain a healthy lifestyle.  Working with a diet and nutrition specialist (dietitian) can help you make a meal plan that is best for you.  Keep in mind that carbohydrates (carbs) and alcohol have immediate effects on your blood glucose levels. It is important to count carbs and to use alcohol carefully. This information is not intended to replace advice given to you by your health care provider. Make sure you discuss any questions you have with your health care provider. Document Revised: 12/13/2016 Document Reviewed: 02/05/2016 Elsevier Patient Education  2020 [employer].

## 2019-07-09 NOTE — Progress Notes (Signed)
Assessment & Plan:  1. Type 2 diabetes mellitus with diabetic neuropathy, without long-term current use of insulin (HCC) Lab Results  Component Value Date   HGBA1C 7.9 (H) 07/09/2019   HGBA1C 10.9 (H) 03/12/2019  - Diabetes is not at goal of A1c < 7, but is imroving. - Medications: D/C glimepiride. Jardiance 10 mg QD x2 weeks (samples given), then increase to Jardiance 25 mg QD - Home glucose monitoring: continue monitoring - Patient is currently taking a statin. Patient is taking an ACE-inhibitor/ARB.  - Last foot exam: 07/09/19 - Last diabetic eye exam: 01/22/2018 - Urine Microalbumin/Creat Ratio: 03/12/19 - Instruction/counseling given: discussed diet and provided printed educational material - Bayer DCA Hb A1c Waived - lisinopril (ZESTRIL) 40 MG tablet; Take 1 tablet (40 mg total) by mouth daily.  Dispense: 30 tablet; Refill: 2 - pravastatin (PRAVACHOL) 40 MG tablet; Take 1 tablet (40 mg total) by mouth daily.  Dispense: 90 tablet; Refill: 1 - CMP14+EGFR - Lipid panel - empagliflozin (JARDIANCE) 25 MG TABS tablet; Take 1 tablet (25 mg total) by mouth daily before breakfast.  Dispense: 30 tablet; Refill: 2  2. Diabetic neuropathy with neurologic complication ([place]) - Well controlled on current regimen.  - gabapentin (NEURONTIN) 400 MG capsule; Take 1 capsule (400 mg total) by mouth 3 (three) times daily.  Dispense: 270 capsule; Refill: 1 - CMP14+EGFR  3. Essential hypertension, benign - Elevated. Lisinopril increased from 20 mg to 40 mg QD.  - lisinopril (ZESTRIL) 40 MG tablet; Take 1 tablet (40 mg total) by mouth daily.  Dispense: 30 tablet; Refill: 2 - CMP14+EGFR - Lipid panel  4. Mixed hyperlipidemia - Patient not good at taking his statin regularly because of the time of day he takes it. Encouraged to take with dinner.  - pravastatin (PRAVACHOL) 40 MG tablet; Take 1 tablet (40 mg total) by mouth daily.  Dispense: 90 tablet; Refill: 1 - CMP14+EGFR - Lipid panel  5.  Gastroesophageal reflux disease without esophagitis - Well controlled on current regimen.  - omeprazole (PRILOSEC OTC) 20 MG tablet; Take 1 tablet (20 mg total) by mouth daily.  Dispense: 90 tablet; Refill: 1  6. Seasonal allergies - Well controlled on current regimen.  - cetirizine (ZYRTEC) 10 MG tablet; Take 1 tablet (10 mg total) by mouth daily.  Dispense: 90 tablet; Refill: 1  7. Family history of thyroid cancer - Patient's brother was just diagnosed with thyroid cancer.  - Thyroid Panel With TSH   Return in about 3 months (around 10/09/2019) for DM, HTN.  Hendricks Limes, MSN, APRN, FNP-C [facility]  Subjective:    Patient ID: Oscar Cox, male    DOB: Jun 08, 1975, 44 y.o.   MRN: [id number]  Patient Care Team: Loman Brooklyn, FNP as PCP - General (Family Medicine) Lissa Hoard Karie Schwalbe, NP as Registered Nurse (Adult Health Nurse Practitioner)   Chief Complaint:  Chief Complaint  Patient presents with   Diabetes    4 month follow up of chronic medical conditions    HPI: Oscar Cox is a 44 y.o. male presenting on 07/09/2019 for Diabetes (4 month follow up of chronic medical conditions)  BP elevated today. Patient has checked his BP at home a few times and reports it is the same as it is here.   Diabetes: Patient presents for follow up of diabetes. Current symptoms include: paresthesia of the feet. Known diabetic complications: peripheral neuropathy. Medication compliance: yes. Current diet: in general, an "unhealthy" diet. Current exercise: stays  active. Home blood sugar records: high 100s to 200s. Is he  on ACE inhibitor or angiotensin II receptor blocker? Yes. Is he on a statin? Yes.   Lab Results  Component Value Date   HGBA1C 7.9 (H) 07/09/2019   HGBA1C 10.9 (H) 03/12/2019   Lab Results  Component Value Date   LDLCALC 151 (H) 07/09/2019   CREATININE 0.87 07/09/2019     Patient admits he is horrible at taking the Pravastatin  because he tries to take it at bedtime.   New complaints: None  Social history:  Relevant past medical, surgical, family and social history reviewed and updated as indicated. Interim medical history since our last visit reviewed.  Allergies and medications reviewed and updated.  DATA REVIEWED: CHART IN EPIC  ROS: Negative unless specifically indicated above in HPI.    Current Outpatient Medications:    cetirizine (ZYRTEC) 10 MG tablet, Take 1 tablet (10 mg total) by mouth daily., Disp: 90 tablet, Rfl: 1   fluticasone (FLONASE) 50 MCG/ACT nasal spray, Place 2 sprays into both nostrils daily., Disp: 16 g, Rfl: 5   gabapentin (NEURONTIN) 400 MG capsule, Take 1 capsule (400 mg total) by mouth 3 (three) times daily., Disp: 270 capsule, Rfl: 1   glimepiride (AMARYL) 2 MG tablet, Start 2 mg by mouth once daily. Increase to 4 mg by mouth once daily after 2 weeks., Disp: 60 tablet, Rfl: 3   lisinopril (ZESTRIL) 20 MG tablet, Take 1 tablet by mouth once daily, Disp: 30 tablet, Rfl: 0   omeprazole (PRILOSEC OTC) 20 MG tablet, Take 1 tablet (20 mg total) by mouth daily., Disp: 90 tablet, Rfl: 1   pravastatin (PRAVACHOL) 40 MG tablet, Take 1 tablet (40 mg total) by mouth daily., Disp: 90 tablet, Rfl: 1   Allergies  Allergen Reactions   Tamiflu [Oseltamivir Phosphate] Hives   Morphine Nausea Only   Potassium Chloride Nausea And Vomiting   Past Medical History:  Diagnosis Date   Acid reflux    Diabetes mellitus without complication (HCC)    Essential hypertension, benign 11/27/2016   Hepatitis C antibody test positive 11/27/2016   Hepatitis C virus infection cured after antiviral drug therapy 09/24/2018   High cholesterol 11/27/2016    Past Surgical History:  Procedure Laterality Date   NO PAST SURGERIES      Social History   Socioeconomic History   Marital status: Single    Spouse name: Not on file   Number of children: Not on file   Years of education: Not on  file   Highest education level: Not on file  Occupational History   Not on file  Tobacco Use   Smoking status: Current Every Day Smoker   Smokeless tobacco: Never Used  Substance and Sexual Activity   Alcohol use: No   Drug use: Yes    Types: Marijuana   Sexual activity: Not on file  Other Topics Concern   Not on file  Social History Narrative   Not on file   Social Determinants of Health   Financial Resource Strain:    Difficulty of Paying Living Expenses:   Food Insecurity:    Worried About Estate manager/land agent of Food in the Last Year:    Arboriculturist in the Last Year:   Transportation Needs:    Lack of Transportation (Medical):    Lack of Transportation (Non-Medical):   Physical Activity:    Days of Exercise per Week:    Minutes of Exercise per Session:  Stress:    Feeling of Stress :   Social Connections:    Frequency of Communication with Friends and Family:    Frequency of Social Gatherings with Friends and Family:    Attends Religious Services:    Active Member of Clubs or Organizations:    Attends Archivist Meetings:    Marital Status:   Intimate Partner Violence:    Fear of Current or Ex-Partner:    Emotionally Abused:    Physically Abused:    Sexually Abused:         Objective:    BP (!) 140/92    Pulse 90    Temp (!) 97.4 F (36.3 C) (Temporal)    Ht '6\' 2"'$  (1.88 m)    Wt 228 lb 6.4 oz (103.6 kg)    SpO2 96%    BMI 29.32 kg/m   Wt Readings from Last 3 Encounters:  07/09/19 228 lb 6.4 oz (103.6 kg)  03/12/19 223 lb (101.2 kg)  02/25/19 219 lb 1.6 oz (99.4 kg)    Physical Exam Vitals reviewed.  Constitutional:      General: He is not in acute distress.    Appearance: Normal appearance. He is overweight. He is not ill-appearing, toxic-appearing or diaphoretic.  HENT:     Head: Normocephalic and atraumatic.  Eyes:     General: No scleral icterus.       Right eye: No discharge.        Left eye: No discharge.       Conjunctiva/sclera: Conjunctivae normal.  Cardiovascular:     Rate and Rhythm: Normal rate and regular rhythm.     Heart sounds: Normal heart sounds. No murmur heard.  No friction rub. No gallop.   Pulmonary:     Effort: Pulmonary effort is normal. No respiratory distress.     Breath sounds: Normal breath sounds. No stridor. No wheezing, rhonchi or rales.  Musculoskeletal:        General: Normal range of motion.     Cervical back: Normal range of motion.  Skin:    General: Skin is warm and dry.  Neurological:     Mental Status: He is alert and oriented to person, place, and time. Mental status is at baseline.  Psychiatric:        Mood and Affect: Mood normal.        Behavior: Behavior normal.        Thought Content: Thought content normal.        Judgment: Judgment normal.    Diabetic Foot Exam - Simple   Simple Foot Form Diabetic Foot exam was performed with the following findings: Yes 07/09/2019 10:41 AM  Visual Inspection No deformities, no ulcerations, no other skin breakdown bilaterally: Yes Sensation Testing Pulse Check Posterior Tibialis and Dorsalis pulse intact bilaterally: Yes Comments Intact to touch and monofilament to areas of bilateral feet except great and 2nd toes bilaterally.      Lab Results  Component Value Date   TSH 1.110 07/09/2019   Lab Results  Component Value Date   WBC 10.6 02/25/2019   HGB 16.4 02/25/2019   HCT 47.1 02/25/2019   MCV 89.4 02/25/2019   PLT 188 02/25/2019   Lab Results  Component Value Date   NA 135 07/09/2019   K 4.3 07/09/2019   CO2 25 07/09/2019   GLUCOSE 221 (H) 07/09/2019   BUN 13 07/09/2019   CREATININE 0.87 07/09/2019   BILITOT 0.2 07/09/2019   ALKPHOS 69 07/09/2019  AST 22 07/09/2019   ALT 45 (H) 07/09/2019   PROT 7.2 07/09/2019   ALBUMIN 4.3 07/09/2019   CALCIUM 9.8 07/09/2019   ANIONGAP 10 09/30/2014   Lab Results  Component Value Date   CHOL 230 (H) 07/09/2019   Lab Results  Component Value  Date   HDL 28 (L) 07/09/2019   Lab Results  Component Value Date   LDLCALC 151 (H) 07/09/2019   Lab Results  Component Value Date   TRIG 274 (H) 07/09/2019   Lab Results  Component Value Date   CHOLHDL 8.2 (H) 07/09/2019   Lab Results  Component Value Date   HGBA1C 7.9 (H) 07/09/2019

## 2019-07-10 LAB — CMP14+EGFR
ALT: 45 IU/L — ABNORMAL HIGH (ref 0–44)
AST: 22 IU/L (ref 0–40)
Albumin/Globulin Ratio: 1.5 (ref 1.2–2.2)
Albumin: 4.3 g/dL (ref 4.0–5.0)
Alkaline Phosphatase: 69 IU/L (ref 48–121)
BUN/Creatinine Ratio: 15 (ref 9–20)
BUN: 13 mg/dL (ref 6–24)
Bilirubin Total: 0.2 mg/dL (ref 0.0–1.2)
CO2: 25 mmol/L (ref 20–29)
Calcium: 9.8 mg/dL (ref 8.7–10.2)
Chloride: 97 mmol/L (ref 96–106)
Creatinine, Ser: 0.87 mg/dL (ref 0.76–1.27)
GFR calc Af Amer: 122 mL/min/{1.73_m2} (ref >59)
GFR calc non Af Amer: 106 mL/min/{1.73_m2} (ref >59)
Globulin, Total: 2.9 g/dL (ref 1.5–4.5)
Glucose: 221 mg/dL — ABNORMAL HIGH (ref 65–99)
Potassium: 4.3 mmol/L (ref 3.5–5.2)
Sodium: 135 mmol/L (ref 134–144)
Total Protein: 7.2 g/dL (ref 6.0–8.5)

## 2019-07-10 LAB — THYROID PANEL WITH TSH
Free Thyroxine Index: 2.2 (ref 1.2–4.9)
T3 Uptake Ratio: 28 % (ref 24–39)
T4, Total: 7.8 ug/dL (ref 4.5–12.0)
TSH: 1.11 u[IU]/mL (ref 0.450–4.500)

## 2019-07-10 LAB — LIPID PANEL
Chol/HDL Ratio: 8.2 ratio — ABNORMAL HIGH (ref 0.0–5.0)
Cholesterol, Total: 230 mg/dL — ABNORMAL HIGH (ref 100–199)
HDL: 28 mg/dL — ABNORMAL LOW (ref >39)
LDL Chol Calc (NIH): 151 mg/dL — ABNORMAL HIGH (ref 0–99)
Triglycerides: 274 mg/dL — ABNORMAL HIGH (ref 0–149)
VLDL Cholesterol Cal: 51 mg/dL — ABNORMAL HIGH (ref 5–40)

## 2019-09-06 ENCOUNTER — Other Ambulatory Visit: Payer: Self-pay | Admitting: Family Medicine

## 2019-09-06 DIAGNOSIS — J302 Other seasonal allergic rhinitis: Secondary | ICD-10-CM

## 2019-09-06 DIAGNOSIS — I10 Essential (primary) hypertension: Secondary | ICD-10-CM

## 2019-10-11 ENCOUNTER — Other Ambulatory Visit: Payer: Self-pay | Admitting: Family Medicine

## 2019-10-11 DIAGNOSIS — I10 Essential (primary) hypertension: Secondary | ICD-10-CM

## 2019-10-11 DIAGNOSIS — E1140 Type 2 diabetes mellitus with diabetic neuropathy, unspecified: Secondary | ICD-10-CM

## 2019-10-12 ENCOUNTER — Ambulatory Visit: Payer: Medicaid Other | Admitting: Family Medicine

## 2019-10-12 ENCOUNTER — Other Ambulatory Visit: Payer: Self-pay

## 2019-10-12 ENCOUNTER — Encounter: Payer: Self-pay | Admitting: Family Medicine

## 2019-10-12 VITALS — BP 109/76 | HR 95 | Temp 96.7°F | Ht 74.0 in | Wt 201.6 lb

## 2019-10-12 DIAGNOSIS — E7800 Pure hypercholesterolemia, unspecified: Secondary | ICD-10-CM | POA: Diagnosis not present

## 2019-10-12 DIAGNOSIS — I10 Essential (primary) hypertension: Secondary | ICD-10-CM

## 2019-10-12 DIAGNOSIS — E1140 Type 2 diabetes mellitus with diabetic neuropathy, unspecified: Secondary | ICD-10-CM | POA: Diagnosis not present

## 2019-10-12 LAB — CMP14+EGFR
ALT: 22 IU/L (ref 0–44)
AST: 19 IU/L (ref 0–40)
Albumin/Globulin Ratio: 1.7 (ref 1.2–2.2)
Albumin: 4.4 g/dL (ref 4.0–5.0)
Alkaline Phosphatase: 70 IU/L (ref 44–121)
BUN/Creatinine Ratio: 13 (ref 9–20)
BUN: 13 mg/dL (ref 6–24)
Bilirubin Total: 0.3 mg/dL (ref 0.0–1.2)
CO2: 23 mmol/L (ref 20–29)
Calcium: 9.9 mg/dL (ref 8.7–10.2)
Chloride: 98 mmol/L (ref 96–106)
Creatinine, Ser: 1 mg/dL (ref 0.76–1.27)
GFR calc Af Amer: 106 mL/min/{1.73_m2} (ref >59)
GFR calc non Af Amer: 92 mL/min/{1.73_m2} (ref >59)
Globulin, Total: 2.6 g/dL (ref 1.5–4.5)
Glucose: 180 mg/dL — ABNORMAL HIGH (ref 65–99)
Potassium: 4.2 mmol/L (ref 3.5–5.2)
Sodium: 135 mmol/L (ref 134–144)
Total Protein: 7 g/dL (ref 6.0–8.5)

## 2019-10-12 LAB — LIPID PANEL
Chol/HDL Ratio: 5.7 ratio — ABNORMAL HIGH (ref 0.0–5.0)
Cholesterol, Total: 181 mg/dL (ref 100–199)
HDL: 32 mg/dL — ABNORMAL LOW (ref >39)
LDL Chol Calc (NIH): 118 mg/dL — ABNORMAL HIGH (ref 0–99)
Triglycerides: 173 mg/dL — ABNORMAL HIGH (ref 0–149)
VLDL Cholesterol Cal: 31 mg/dL (ref 5–40)

## 2019-10-12 LAB — BAYER DCA HB A1C WAIVED: HB A1C (BAYER DCA - WAIVED): 7.3 % — ABNORMAL HIGH (ref <7.0)

## 2019-10-12 MED ORDER — GLYXAMBI 25-5 MG PO TABS: 1.0000 | ORAL_TABLET | Freq: Every day | ORAL | 2 refills | Status: DC

## 2019-10-12 MED ORDER — LISINOPRIL 40 MG PO TABS: 40.0000 mg | ORAL_TABLET | Freq: Every day | ORAL | 1 refills | Status: DC

## 2019-10-12 NOTE — Patient Instructions (Signed)

## 2019-10-12 NOTE — Progress Notes (Signed)
Assessment & Plan:  1. Type 2 diabetes mellitus with diabetic neuropathy, without long-term current use of insulin (HCC) Lab Results  Component Value Date   HGBA1C 7.3 (H) 10/12/2019   HGBA1C 7.9 (H) 07/09/2019   HGBA1C 10.9 (H) 03/12/2019  - Diabetes is not at goal of A1c < 7, but is improving. - Medications: D/C Jardiance; start Glyxambi - Home glucose monitoring: Continue monitoring - Patient is currently taking a statin. Patient is taking an ACE-inhibitor/ARB.  - Last foot exam: 07/09/2019 - Last diabetic eye exam: 01/22/2018 - Urine Microalbumin/Creat Ratio: 03/12/2019 - Instruction/counseling given: reminded to get eye exam, discussed diet and provided printed educational material - Bayer DCA Hb A1c Waived - Lipid panel - CMP14+EGFR - Empagliflozin-linaGLIPtin (GLYXAMBI) 25-5 MG TABS; Take 1 tablet by mouth daily.  Dispense: 30 tablet; Refill: 2 - lisinopril (ZESTRIL) 40 MG tablet; Take 1 tablet (40 mg total) by mouth daily.  Dispense: 90 tablet; Refill: 1  2. Essential hypertension, benign - Well controlled on current regimen.  - Lipid panel - CMP14+EGFR - lisinopril (ZESTRIL) 40 MG tablet; Take 1 tablet (40 mg total) by mouth daily.  Dispense: 90 tablet; Refill: 1  3. High cholesterol - Discussed importance of medication. Encouraged patient to set an alarm to help him remember to take medication.  - Lipid panel - CMP14+EGFR   Return in about 3 months (around 01/11/2020) for DM.  Hendricks Limes, MSN, APRN, FNP-C [facility]  Subjective:    Patient ID: Oscar Cox, male    DOB: 20-Mar-1975, 44 y.o.   MRN: [id number]  Patient Care Team: Loman Brooklyn, FNP as PCP - General (Family Medicine) Lissa Hoard Karie Schwalbe, NP as Registered Nurse (Adult Health Nurse Practitioner)   Chief Complaint:  Chief Complaint  Patient presents with   Diabetes    check up of chronic medical conditions   Hypertension    HPI: Oscar Cox is a 44  y.o. male presenting on 10/12/2019 for Diabetes (check up of chronic medical conditions) and Hypertension  Diabetes: Patient presents for follow up of diabetes. Current symptoms include: paresthesia of the feet. Known diabetic complications: peripheral neuropathy. Medication compliance: yes. Current diet: is eating healthier but does still eat some sweets (just smaller amounts). Current exercise: none. Home blood sugar records: BGs are running  consistent with Hgb A1C. Is he  on ACE inhibitor or angiotensin II receptor blocker? Yes. Is he on a statin? Yes, although he reports he is not good a remembering to take it.   Lab Results  Component Value Date   HGBA1C 7.3 (H) 10/12/2019   HGBA1C 7.9 (H) 07/09/2019   HGBA1C 10.9 (H) 03/12/2019   Lab Results  Component Value Date   LDLCALC 118 (H) 10/12/2019   CREATININE 1.00 10/12/2019     Patient is also following up on hypertension. At his last visit Lisinopril was increased from 20 mg to 40 mg once daily.   New complaints: None  Social history:  Relevant past medical, surgical, family and social history reviewed and updated as indicated. Interim medical history since our last visit reviewed.  Allergies and medications reviewed and updated.  DATA REVIEWED: CHART IN EPIC  ROS: Negative unless specifically indicated above in HPI.    Current Outpatient Medications:    EQ ALLERGY RELIEF, CETIRIZINE, 10 MG tablet, Take 1 tablet by mouth once daily, Disp: 90 tablet, Rfl: 0   fluticasone (FLONASE) 50 MCG/ACT nasal spray, Use 2 spray(s) in each nostril once daily,  Disp: 16 g, Rfl: 1   gabapentin (NEURONTIN) 400 MG capsule, Take 1 capsule (400 mg total) by mouth 3 (three) times daily., Disp: 270 capsule, Rfl: 1   JARDIANCE 25 MG TABS tablet, TAKE 1 TABLET BY MOUTH ONCE DAILY BEFORE BREAKFAST, Disp: 30 tablet, Rfl: 0   lisinopril (ZESTRIL) 40 MG tablet, Take 1 tablet by mouth once daily, Disp: 30 tablet, Rfl: 0   omeprazole (PRILOSEC OTC)  20 MG tablet, Take 1 tablet (20 mg total) by mouth daily., Disp: 90 tablet, Rfl: 1   pravastatin (PRAVACHOL) 40 MG tablet, Take 1 tablet (40 mg total) by mouth daily., Disp: 90 tablet, Rfl: 1   Allergies  Allergen Reactions   Tamiflu [Oseltamivir Phosphate] Hives   Morphine Nausea Only   Potassium Chloride Nausea And Vomiting   Past Medical History:  Diagnosis Date   Acid reflux    Diabetes mellitus without complication (HCC)    Essential hypertension, benign 11/27/2016   Hepatitis C antibody test positive 11/27/2016   Hepatitis C virus infection cured after antiviral drug therapy 09/24/2018   High cholesterol 11/27/2016    Past Surgical History:  Procedure Laterality Date   NO PAST SURGERIES      Social History   Socioeconomic History   Marital status: Single    Spouse name: Not on file   Number of children: Not on file   Years of education: Not on file   Highest education level: Not on file  Occupational History   Not on file  Tobacco Use   Smoking status: Current Every Day Smoker   Smokeless tobacco: Never Used  Substance and Sexual Activity   Alcohol use: No   Drug use: Yes    Types: Marijuana   Sexual activity: Not on file  Other Topics Concern   Not on file  Social History Narrative   Not on file   Social Determinants of Health   Financial Resource Strain:    Difficulty of Paying Living Expenses: Not on file  Food Insecurity:    Worried About Running Out of Food in the Last Year: Not on file   Ran Out of Food in the Last Year: Not on file  Transportation Needs:    Lack of Transportation (Medical): Not on file   Lack of Transportation (Non-Medical): Not on file  Physical Activity:    Days of Exercise per Week: Not on file   Minutes of Exercise per Session: Not on file  Stress:    Feeling of Stress : Not on file  Social Connections:    Frequency of Communication with Friends and Family: Not on file   Frequency of Social  Gatherings with Friends and Family: Not on file   Attends Religious Services: Not on file   Active Member of Clubs or Organizations: Not on file   Attends Banker Meetings: Not on file   Marital Status: Not on file  Intimate Partner Violence:    Fear of Current or Ex-Partner: Not on file   Emotionally Abused: Not on file   Physically Abused: Not on file   Sexually Abused: Not on file        Objective:    BP 109/76    Pulse 95    Temp (!) 96.7 F (35.9 C) (Temporal)    Ht 6\' 2"  (1.88 m)    Wt 201 lb 9.6 oz (91.4 kg)    SpO2 97%    BMI 25.88 kg/m   Wt Readings from Last 3  Encounters:  10/12/19 201 lb 9.6 oz (91.4 kg)  07/09/19 228 lb 6.4 oz (103.6 kg)  03/12/19 223 lb (101.2 kg)    Physical Exam Vitals reviewed.  Constitutional:      General: He is not in acute distress.    Appearance: Normal appearance. He is normal weight. He is not ill-appearing, toxic-appearing or diaphoretic.  HENT:     Head: Normocephalic and atraumatic.  Eyes:     General: No scleral icterus.       Right eye: No discharge.        Left eye: No discharge.     Conjunctiva/sclera: Conjunctivae normal.  Cardiovascular:     Rate and Rhythm: Normal rate and regular rhythm.     Heart sounds: Normal heart sounds. No murmur heard.  No friction rub. No gallop.   Pulmonary:     Effort: Pulmonary effort is normal. No respiratory distress.     Breath sounds: Normal breath sounds. No stridor. No wheezing, rhonchi or rales.  Musculoskeletal:        General: Normal range of motion.     Cervical back: Normal range of motion.  Skin:    General: Skin is warm and dry.  Neurological:     Mental Status: He is alert and oriented to person, place, and time. Mental status is at baseline.  Psychiatric:        Mood and Affect: Mood normal.        Behavior: Behavior normal.        Thought Content: Thought content normal.        Judgment: Judgment normal.     Lab Results  Component Value Date     TSH 1.110 07/09/2019   Lab Results  Component Value Date   WBC 10.6 02/25/2019   HGB 16.4 02/25/2019   HCT 47.1 02/25/2019   MCV 89.4 02/25/2019   PLT 188 02/25/2019   Lab Results  Component Value Date   NA 135 07/09/2019   K 4.3 07/09/2019   CO2 25 07/09/2019   GLUCOSE 221 (H) 07/09/2019   BUN 13 07/09/2019   CREATININE 0.87 07/09/2019   BILITOT 0.2 07/09/2019   ALKPHOS 69 07/09/2019   AST 22 07/09/2019   ALT 45 (H) 07/09/2019   PROT 7.2 07/09/2019   ALBUMIN 4.3 07/09/2019   CALCIUM 9.8 07/09/2019   ANIONGAP 10 09/30/2014   Lab Results  Component Value Date   CHOL 230 (H) 07/09/2019   Lab Results  Component Value Date   HDL 28 (L) 07/09/2019   Lab Results  Component Value Date   LDLCALC 151 (H) 07/09/2019   Lab Results  Component Value Date   TRIG 274 (H) 07/09/2019   Lab Results  Component Value Date   CHOLHDL 8.2 (H) 07/09/2019   Lab Results  Component Value Date   HGBA1C 7.9 (H) 07/09/2019

## 2019-10-18 ENCOUNTER — Encounter: Payer: Self-pay | Admitting: Family Medicine

## 2019-11-08 ENCOUNTER — Other Ambulatory Visit: Payer: Self-pay | Admitting: Family Medicine

## 2019-11-08 DIAGNOSIS — J302 Other seasonal allergic rhinitis: Secondary | ICD-10-CM

## 2019-11-22 ENCOUNTER — Encounter (INDEPENDENT_AMBULATORY_CARE_PROVIDER_SITE_OTHER): Payer: Self-pay

## 2019-12-06 ENCOUNTER — Other Ambulatory Visit: Payer: Self-pay | Admitting: Family Medicine

## 2019-12-06 DIAGNOSIS — J302 Other seasonal allergic rhinitis: Secondary | ICD-10-CM

## 2019-12-08 ENCOUNTER — Encounter: Payer: Self-pay | Admitting: *Deleted

## 2019-12-30 ENCOUNTER — Other Ambulatory Visit: Payer: Self-pay

## 2019-12-30 ENCOUNTER — Encounter: Payer: Self-pay | Admitting: Family

## 2019-12-30 ENCOUNTER — Ambulatory Visit (INDEPENDENT_AMBULATORY_CARE_PROVIDER_SITE_OTHER): Payer: Medicaid Other | Admitting: Family

## 2019-12-30 ENCOUNTER — Ambulatory Visit: Payer: Medicaid Other

## 2019-12-30 ENCOUNTER — Ambulatory Visit (INDEPENDENT_AMBULATORY_CARE_PROVIDER_SITE_OTHER): Payer: Medicaid Other

## 2019-12-30 DIAGNOSIS — M5441 Lumbago with sciatica, right side: Secondary | ICD-10-CM | POA: Diagnosis not present

## 2019-12-30 DIAGNOSIS — M25532 Pain in left wrist: Secondary | ICD-10-CM

## 2019-12-30 DIAGNOSIS — M5442 Lumbago with sciatica, left side: Secondary | ICD-10-CM

## 2019-12-30 MED ORDER — DICLOFENAC SODIUM 75 MG PO TBEC: 75.0000 mg | DELAYED_RELEASE_TABLET | Freq: Two times a day (BID) | ORAL | 0 refills | Status: DC

## 2019-12-30 MED ORDER — PREDNISONE 10 MG (21) PO TBPK: ORAL_TABLET | ORAL | 0 refills | Status: DC

## 2019-12-30 MED ORDER — BACLOFEN 10 MG PO TABS: 10.0000 mg | ORAL_TABLET | Freq: Three times a day (TID) | ORAL | 0 refills | Status: DC

## 2019-12-30 NOTE — Progress Notes (Signed)
Virtual Visit via telephone Note Due to COVID-19 pandemic this visit was conducted virtually. This visit type was conducted due to national recommendations for restrictions regarding the COVID-19 Pandemic (e.g. social distancing, sheltering in place) in an effort to limit this patient's exposure and mitigate transmission in our community. All issues noted in this document were discussed and addressed.  A physical exam was not performed with this format.  I connected with Oscar Cox on 12/30/19 at 10:03 AM  by telephone and verified that I am speaking with the correct person using two identifiers. Oscar Cox is currently located at home and no one is currently with him  during visit. The provider, Jannifer Rodney, FNP is located in their office at time of visit.  I discussed the limitations, risks, security and privacy concerns of performing an evaluation and management service by telephone and the availability of in person appointments. I also discussed with the patient that there may be a patient responsible charge related to this service. The patient expressed understanding and agreed to proceed.   History and Present Illness:  Pt calls the office today lower back pain that started 3-4 days ago Back Pain This is a new problem. The current episode started in the past 7 days. The problem occurs intermittently. The problem has been waxing and waning since onset. The pain is present in the gluteal. The quality of the pain is described as aching. The pain radiates to the right thigh and left thigh. The pain is at a severity of 10/10. The pain is moderate. The symptoms are aggravated by standing and twisting. Associated symptoms include numbness and tingling. Risk factors include obesity. He has tried NSAIDs for the symptoms. The treatment provided mild relief.  Wrist Injury  The incident occurred more than 1 week ago. Injury mechanism: woke up hurt. The pain is present in the left wrist.  The quality of the pain is described as aching. The pain is at a severity of 8/10. The pain is moderate. The pain has been intermittent since the incident. Associated symptoms include numbness and tingling. He has tried NSAIDs for the symptoms. The treatment provided mild relief.    .  Review of Systems  Musculoskeletal: Positive for back pain.  Neurological: Positive for tingling and numbness.     Observations/Objective: No SOB or distress noted   Assessment and Plan: 1. Acute bilateral low back pain with bilateral sciatica Rest ROM exercises  No other NSAID's RTO if symptoms worsen or do not improve  - diclofenac (VOLTAREN) 75 MG EC tablet; Take 1 tablet (75 mg total) by mouth 2 (two) times daily.  Dispense: 30 tablet; Refill: 0 - baclofen (LIORESAL) 10 MG tablet; Take 1 tablet (10 mg total) by mouth 3 (three) times daily.  Dispense: 30 each; Refill: 0 - predniSONE (STERAPRED UNI-PAK 21 TAB) 10 MG (21) TBPK tablet; Use as directed  Dispense: 21 tablet; Refill: 0  2. Left wrist pain No other NSAID's while taking diclofenac  X-ray pending  - DG Wrist Complete Left; Future     I discussed the assessment and treatment plan with the patient. The patient was provided an opportunity to ask questions and all were answered. The patient agreed with the plan and demonstrated an understanding of the instructions.   The patient was advised to call back or seek an in-person evaluation if the symptoms worsen or if the condition fails to improve as anticipated.  The above assessment and management plan was discussed with  the patient. The patient verbalized understanding of and has agreed to the management plan. Patient is aware to call the clinic if symptoms persist or worsen. Patient is aware when to return to the clinic for a follow-up visit. Patient educated on when it is appropriate to go to the emergency department.   Time call ended:  10:14 AM   I provided 11 minutes of non-face-to-face  time during this encounter.    Jannifer Rodney, FNP

## 2020-01-11 ENCOUNTER — Ambulatory Visit: Payer: Medicaid Other | Admitting: Family Medicine

## 2020-01-13 ENCOUNTER — Other Ambulatory Visit: Payer: Self-pay | Admitting: Family Medicine

## 2020-01-13 DIAGNOSIS — E1140 Type 2 diabetes mellitus with diabetic neuropathy, unspecified: Secondary | ICD-10-CM

## 2020-01-26 ENCOUNTER — Ambulatory Visit (INDEPENDENT_AMBULATORY_CARE_PROVIDER_SITE_OTHER): Payer: Medicaid Other

## 2020-01-26 ENCOUNTER — Other Ambulatory Visit: Payer: Self-pay

## 2020-01-26 ENCOUNTER — Encounter: Payer: Self-pay | Admitting: Family Medicine

## 2020-01-26 ENCOUNTER — Ambulatory Visit: Payer: Medicaid Other | Admitting: Family Medicine

## 2020-01-26 VITALS — BP 105/77 | HR 113 | Temp 98.0°F | Resp 20 | Ht 74.0 in | Wt 193.0 lb

## 2020-01-26 DIAGNOSIS — M542 Cervicalgia: Secondary | ICD-10-CM

## 2020-01-26 DIAGNOSIS — K219 Gastro-esophageal reflux disease without esophagitis: Secondary | ICD-10-CM | POA: Diagnosis not present

## 2020-01-26 DIAGNOSIS — L918 Other hypertrophic disorders of the skin: Secondary | ICD-10-CM

## 2020-01-26 DIAGNOSIS — E1149 Type 2 diabetes mellitus with other diabetic neurological complication: Secondary | ICD-10-CM | POA: Diagnosis not present

## 2020-01-26 DIAGNOSIS — I10 Essential (primary) hypertension: Secondary | ICD-10-CM | POA: Diagnosis not present

## 2020-01-26 DIAGNOSIS — E1140 Type 2 diabetes mellitus with diabetic neuropathy, unspecified: Secondary | ICD-10-CM | POA: Diagnosis not present

## 2020-01-26 DIAGNOSIS — E782 Mixed hyperlipidemia: Secondary | ICD-10-CM

## 2020-01-26 DIAGNOSIS — E7800 Pure hypercholesterolemia, unspecified: Secondary | ICD-10-CM

## 2020-01-26 LAB — BAYER DCA HB A1C WAIVED: HB A1C (BAYER DCA - WAIVED): 7.9 % — ABNORMAL HIGH (ref <7.0)

## 2020-01-26 MED ORDER — GABAPENTIN 400 MG PO CAPS
400.0000 mg | ORAL_CAPSULE | Freq: Three times a day (TID) | ORAL | 1 refills | Status: DC
Start: 2020-01-26 — End: 2020-08-04

## 2020-01-26 MED ORDER — LISINOPRIL 40 MG PO TABS: 40.0000 mg | ORAL_TABLET | Freq: Every day | ORAL | 1 refills | Status: DC

## 2020-01-26 MED ORDER — GLYXAMBI 25-5 MG PO TABS: 1.0000 | ORAL_TABLET | Freq: Every day | ORAL | 1 refills | Status: DC

## 2020-01-26 MED ORDER — PRAVASTATIN SODIUM 40 MG PO TABS: 40.0000 mg | ORAL_TABLET | Freq: Every day | ORAL | 1 refills | Status: DC

## 2020-01-26 MED ORDER — OMEPRAZOLE MAGNESIUM 20 MG PO TBEC: 20.0000 mg | DELAYED_RELEASE_TABLET | Freq: Every day | ORAL | 1 refills | Status: DC

## 2020-01-26 MED ORDER — GLIPIZIDE ER 5 MG PO TB24
5.0000 mg | ORAL_TABLET | Freq: Every day | ORAL | 1 refills | Status: DC
Start: 2020-01-26 — End: 2020-07-18

## 2020-01-26 NOTE — Patient Instructions (Signed)
Diabetes Mellitus and Nutrition, Adult When you have diabetes, or diabetes mellitus, it is very important to have healthy eating habits because your blood sugar (glucose) levels are greatly affected by what you eat and drink. Eating healthy foods in the right amounts, at about the same times every day, can help you:  Control your blood glucose.  Lower your risk of heart disease.  Improve your blood pressure.  Reach or maintain a healthy weight. What can affect my meal plan? Every person with diabetes is different, and each person has different needs for a meal plan. Your health care provider may recommend that you work with a dietitian to make a meal plan that is best for you. Your meal plan may vary depending on factors such as:  The calories you need.  The medicines you take.  Your weight.  Your blood glucose, blood pressure, and cholesterol levels.  Your activity level.  Other health conditions you have, such as heart or kidney disease. How do carbohydrates affect me? Carbohydrates, also called carbs, affect your blood glucose level more than any other type of food. Eating carbs naturally raises the amount of glucose in your blood. Carb counting is a method for keeping track of how many carbs you eat. Counting carbs is important to keep your blood glucose at a healthy level, especially if you use insulin or take certain oral diabetes medicines. It is important to know how many carbs you can safely have in each meal. This is different for every person. Your dietitian can help you calculate how many carbs you should have at each meal and for each snack. How does alcohol affect me? Alcohol can cause a sudden decrease in blood glucose (hypoglycemia), especially if you use insulin or take certain oral diabetes medicines. Hypoglycemia can be a life-threatening condition. Symptoms of hypoglycemia, such as sleepiness, dizziness, and confusion, are similar to symptoms of having too much  alcohol.  Do not drink alcohol if: ? Your health care provider tells you not to drink. ? You are pregnant, may be pregnant, or are planning to become pregnant.  If you drink alcohol: ? Do not drink on an empty stomach. ? Limit how much you use to:  0-1 drink a day for women.  0-2 drinks a day for men. ? Be aware of how much alcohol is in your drink. In the [place] one drink equals one 12 oz bottle of beer (355 mL), one 5 oz glass of wine (148 mL), or one 1 oz glass of hard liquor (44 mL). ? Keep yourself hydrated with water, diet soda, or unsweetened iced tea.  Keep in mind that regular soda, juice, and other mixers may contain a lot of sugar and must be counted as carbs. What are tips for following this plan? Reading food labels  Start by checking the serving size on the "Nutrition Facts" label of packaged foods and drinks. The amount of calories, carbs, fats, and other nutrients listed on the label is based on one serving of the item. Many items contain more than one serving per package.  Check the total grams (g) of carbs in one serving. You can calculate the number of servings of carbs in one serving by dividing the total carbs by 15. For example, if a food has 30 g of total carbs per serving, it would be equal to 2 servings of carbs.  Check the number of grams (g) of saturated fats and trans fats in one serving. Choose foods that have   a low amount or none of these fats.  Check the number of milligrams (mg) of salt (sodium) in one serving. Most people should limit total sodium intake to less than 2,300 mg per day.  Always check the nutrition information of foods labeled as "low-fat" or "nonfat." These foods may be higher in added sugar or refined carbs and should be avoided.  Talk to your dietitian to identify your daily goals for nutrients listed on the label. Shopping  Avoid buying canned, pre-made, or processed foods. These foods tend to be high in fat, sodium, and added  sugar.  Shop around the outside edge of the grocery store. This is where you will most often find fresh fruits and vegetables, bulk grains, fresh meats, and fresh dairy. Cooking  Use low-heat cooking methods, such as baking, instead of high-heat cooking methods like deep frying.  Cook using healthy oils, such as olive, canola, or sunflower oil.  Avoid cooking with butter, cream, or high-fat meats. Meal planning  Eat meals and snacks regularly, preferably at the same times every day. Avoid going long periods of time without eating.  Eat foods that are high in fiber, such as fresh fruits, vegetables, beans, and whole grains. Talk with your dietitian about how many servings of carbs you can eat at each meal.  Eat 4-6 oz (112-168 g) of lean protein each day, such as lean meat, chicken, fish, eggs, or tofu. One ounce (oz) of lean protein is equal to: ? 1 oz (28 g) of meat, chicken, or fish. ? 1 egg. ?  cup (62 g) of tofu.  Eat some foods each day that contain healthy fats, such as avocado, nuts, seeds, and fish.   What foods should I eat? Fruits Berries. Apples. Oranges. Peaches. Apricots. Plums. Grapes. Mango. Papaya. Pomegranate. Kiwi. Cherries. Vegetables Lettuce. Spinach. Leafy greens, including kale, chard, collard greens, and mustard greens. Beets. Cauliflower. Cabbage. Broccoli. Carrots. Green beans. Tomatoes. Peppers. Onions. Cucumbers. Brussels sprouts. Grains Whole grains, such as whole-wheat or whole-grain bread, crackers, tortillas, cereal, and pasta. Unsweetened oatmeal. Quinoa. Brown or wild rice. Meats and other proteins Seafood. Poultry without skin. Lean cuts of poultry and beef. Tofu. Nuts. Seeds. Dairy Low-fat or fat-free dairy products such as milk, yogurt, and cheese. The items listed above may not be a complete list of foods and beverages you can eat. Contact a dietitian for more information. What foods should I avoid? Fruits Fruits canned with  syrup. Vegetables Canned vegetables. Frozen vegetables with butter or cream sauce. Grains Refined white flour and flour products such as bread, pasta, snack foods, and cereals. Avoid all processed foods. Meats and other proteins Fatty cuts of meat. Poultry with skin. Breaded or fried meats. Processed meat. Avoid saturated fats. Dairy Full-fat yogurt, cheese, or milk. Beverages Sweetened drinks, such as soda or iced tea. The items listed above may not be a complete list of foods and beverages you should avoid. Contact a dietitian for more information. Questions to ask a health care provider  Do I need to meet with a diabetes educator?  Do I need to meet with a dietitian?  What number can I call if I have questions?  When are the best times to check my blood glucose? Where to find more information:  American Diabetes Association: diabetes.org  Academy of Nutrition and Dietetics: www.eatright.org  National Institute of Diabetes and Digestive and Kidney Diseases: www.niddk.nih.gov  Association of Diabetes Care and Education Specialists: www.diabeteseducator.org Summary  It is important to have healthy eating   habits because your blood sugar (glucose) levels are greatly affected by what you eat and drink.  A healthy meal plan will help you control your blood glucose and maintain a healthy lifestyle.  Your health care provider may recommend that you work with a dietitian to make a meal plan that is best for you.  Keep in mind that carbohydrates (carbs) and alcohol have immediate effects on your blood glucose levels. It is important to count carbs and to use alcohol carefully. This information is not intended to replace advice given to you by your health care provider. Make sure you discuss any questions you have with your health care provider. Document Revised: 12/08/2018 Document Reviewed: 12/08/2018 Elsevier Patient Education  2021 Elsevier Inc.  

## 2020-01-26 NOTE — Progress Notes (Signed)
Assessment & Plan:  1. Type 2 diabetes mellitus with diabetic neuropathy, without long-term current use of insulin (HCC) Lab Results  Component Value Date   HGBA1C 7.9 (H) 01/26/2020   HGBA1C 7.3 (H) 10/12/2019   HGBA1C 7.9 (H) 07/09/2019    - Diabetes is not at goal of A1c < 7. - Medications: continue current medications, start Glipizide XL 5 mg daily. - Home glucose monitoring: Continue monitoring - Patient is currently taking a statin. Patient is taking an ACE-inhibitor/ARB.  - Last foot exam: July 09, 2019 - Last diabetic eye exam: January 22, 2018 - Urine Microalbumin/Creat Ratio: March 12, 2019 - Instruction/counseling given: reminded to get eye exam, discussed diet and provided printed educational material - CBC with Differential/Platelet - CMP14+EGFR - Lipid panel - Bayer DCA Hb A1c Waived - Empagliflozin-linaGLIPtin (GLYXAMBI) 25-5 MG TABS; Take 1 tablet by mouth daily.  Dispense: 90 tablet; Refill: 1 - lisinopril (ZESTRIL) 40 MG tablet; Take 1 tablet (40 mg total) by mouth daily.  Dispense: 90 tablet; Refill: 1 - pravastatin (PRAVACHOL) 40 MG tablet; Take 1 tablet (40 mg total) by mouth daily.  Dispense: 90 tablet; Refill: 1 - Microalbumin / creatinine urine ratio - glipiZIDE (GLUCOTROL XL) 5 MG 24 hr tablet; Take 1 tablet (5 mg total) by mouth daily with breakfast.  Dispense: 90 tablet; Refill: 1  2. Diabetic neuropathy with neurologic complication ([place]) - Well controlled on current regimen.  - CMP14+EGFR - gabapentin (NEURONTIN) 400 MG capsule; Take 1 capsule (400 mg total) by mouth 3 (three) times daily.  Dispense: 270 capsule; Refill: 1  3. Essential hypertension, benign - Well controlled on current regimen.  - CBC with Differential/Platelet - CMP14+EGFR - Lipid panel - lisinopril (ZESTRIL) 40 MG tablet; Take 1 tablet (40 mg total) by mouth daily.  Dispense: 90 tablet; Refill: 1  4. Mixed hyperlipidemia - Labs today to assess.  - CBC with  Differential/Platelet - CMP14+EGFR - Lipid panel - pravastatin (PRAVACHOL) 40 MG tablet; Take 1 tablet (40 mg total) by mouth daily.  Dispense: 90 tablet; Refill: 1  5. Gastroesophageal reflux disease without esophagitis - Well controlled on current regimen.  - CMP14+EGFR - omeprazole (PRILOSEC OTC) 20 MG tablet; Take 1 tablet (20 mg total) by mouth daily.  Dispense: 90 tablet; Refill: 1  6. Neck pain - NSAID as needed for pain. - DG Cervical Spine Complete  7. Skin tag - Skin tag removed from left axilla without any complications. Patient tolerated well.   Return in about 3 months (around 04/25/2020) for DM.  Hendricks Limes, MSN, APRN, FNP-C [facility]  Subjective:    Patient ID: Oscar Cox, male    DOB: 17-Mar-1975, 45 y.o.   MRN: [id number]  Patient Care Team: Loman Brooklyn, FNP as PCP - General (Family Medicine) Lissa Hoard Karie Schwalbe, NP as Registered Nurse (Adult Health Nurse Practitioner)   Chief Complaint:  Chief Complaint  Patient presents with  . Medical Management of Chronic Issues  . Diabetes  . Hyperlipidemia  . Hypertension    HPI: Oscar Cox is a 45 y.o. male presenting on 01/26/2020 for Medical Management of Chronic Issues, Diabetes, Hyperlipidemia, and Hypertension  Patient is accompanied by his daughter who he is okay with being present.  Diabetes: Patient presents for follow up of diabetes. Current symptoms include: hyperglycemia and paresthesia of the feet. Known diabetic complications: peripheral neuropathy. Medication compliance: yes. Current diet: in general, an "unhealthy" diet. Current exercise: none. Home blood sugar records: BGs  are running  consistent with Hgb A1C. Is he  on ACE inhibitor or angiotensin II receptor blocker? Yes. Is he on a statin? Yes.   Lab Results  Component Value Date   HGBA1C 7.9 (H) 01/26/2020   HGBA1C 7.3 (H) 10/12/2019   HGBA1C 7.9 (H) 07/09/2019   Lab Results  Component Value  Date   LDLCALC 142 (H) 01/26/2020   CREATININE 1.12 01/26/2020     New complaints: Patient is concerned about a skin tag in his left axilla. He reports that has been there for a very long time but has recently become very irritated and painful.  Patient also reports neck pain that occurs occasionally. He describes the pain as burning and annoying and rates it 5 out of 10 when it occurs. States when it occurs he often feels lightheaded. He states this has been going on for as long as he can remember. Denies any injuries to his neck. Resolves shortly if he just goes and lies down. He never takes any medications when it occurs.   Social history:  Relevant past medical, surgical, family and social history reviewed and updated as indicated. Interim medical history since our last visit reviewed.  Allergies and medications reviewed and updated.  DATA REVIEWED: CHART IN EPIC  ROS: Negative unless specifically indicated above in HPI.    Current Outpatient Medications:  .  EQ ALLERGY RELIEF, CETIRIZINE, 10 MG tablet, Take 1 tablet by mouth once daily, Disp: 90 tablet, Rfl: 1 .  fluticasone (FLONASE) 50 MCG/ACT nasal spray, Use 2 spray(s) in each nostril once daily, Disp: 16 g, Rfl: 5 .  gabapentin (NEURONTIN) 400 MG capsule, Take 1 capsule (400 mg total) by mouth 3 (three) times daily., Disp: 270 capsule, Rfl: 1 .  GLYXAMBI 25-5 MG TABS, Take 1 tablet by mouth once daily, Disp: 30 tablet, Rfl: 0 .  lisinopril (ZESTRIL) 40 MG tablet, Take 1 tablet (40 mg total) by mouth daily., Disp: 90 tablet, Rfl: 1 .  omeprazole (PRILOSEC OTC) 20 MG tablet, Take 1 tablet (20 mg total) by mouth daily., Disp: 90 tablet, Rfl: 1 .  pravastatin (PRAVACHOL) 40 MG tablet, Take 1 tablet (40 mg total) by mouth daily., Disp: 90 tablet, Rfl: 1   Allergies  Allergen Reactions  . Tamiflu [Oseltamivir Phosphate] Hives  . Metformin And Related Diarrhea  . Morphine Nausea Only  . Potassium Chloride Nausea And Vomiting    Past Medical History:  Diagnosis Date  . Acid reflux   . Diabetes mellitus without complication (HCC)   . Essential hypertension, benign 11/27/2016  . Hepatitis C virus infection cured after antiviral drug therapy 09/24/2018  . High cholesterol 11/27/2016    Past Surgical History:  Procedure Laterality Date  . NO PAST SURGERIES      Social History   Socioeconomic History  . Marital status: Single    Spouse name: Not on file  . Number of children: Not on file  . Years of education: Not on file  . Highest education level: Not on file  Occupational History  . Not on file  Tobacco Use  . Smoking status: Current Every Day Smoker  . Smokeless tobacco: Never Used  Substance and Sexual Activity  . Alcohol use: No  . Drug use: Yes    Types: Marijuana  . Sexual activity: Not on file  Other Topics Concern  . Not on file  Social History Narrative  . Not on file   Social Determinants of Health   Financial Resource  Strain: Not on file  Food Insecurity: Not on file  Transportation Needs: Not on file  Physical Activity: Not on file  Stress: Not on file  Social Connections: Not on file  Intimate Partner Violence: Not on file        Objective:    BP 105/77   Pulse (!) 113   Temp 98 F (36.7 C)   Resp 20   Ht $R'6\' 2"'qk$  (1.88 m)   Wt 193 lb (87.5 kg)   SpO2 98%   BMI 24.78 kg/m   Wt Readings from Last 3 Encounters:  01/26/20 193 lb (87.5 kg)  10/12/19 201 lb 9.6 oz (91.4 kg)  07/09/19 228 lb 6.4 oz (103.6 kg)     Physical Exam Vitals reviewed.  Constitutional:      General: He is not in acute distress.    Appearance: Normal appearance. He is normal weight. He is not ill-appearing, toxic-appearing or diaphoretic.  HENT:     Head: Normocephalic and atraumatic.  Eyes:     General: No scleral icterus.       Right eye: No discharge.        Left eye: No discharge.     Conjunctiva/sclera: Conjunctivae normal.  Cardiovascular:     Rate and Rhythm: Normal rate and  regular rhythm.     Heart sounds: Normal heart sounds. No murmur heard. No friction rub. No gallop.   Pulmonary:     Effort: Pulmonary effort is normal. No respiratory distress.     Breath sounds: Normal breath sounds. No stridor. No wheezing, rhonchi or rales.  Musculoskeletal:        General: Normal range of motion.     Cervical back: Normal range of motion. No swelling, edema, deformity, erythema, signs of trauma, lacerations, rigidity, spasms, torticollis, tenderness, bony tenderness or crepitus. No pain with movement. Normal range of motion.  Skin:    General: Skin is warm and dry.     Comments: Very small skin tag to left axilla.  Neurological:     Mental Status: He is alert and oriented to person, place, and time. Mental status is at baseline.  Psychiatric:        Mood and Affect: Mood normal.        Behavior: Behavior normal.        Thought Content: Thought content normal.        Judgment: Judgment normal.    Skin excision  Date/Time: 01/26/2020 1:20 PM Performed by: Loman Brooklyn, FNP Authorized by: Loman Brooklyn, FNP   Number of Lesions: 1 Lesion 1:    Body area: trunk   Trunk location: L axilla   Initial size (mm): 2   Malignancy: benign lesion     Destruction method: scissors used for extraction     Repair comments: Area numbed with ethyl chloride prior to removing skin tag. No bleeding occurred.    Lab Results  Component Value Date   TSH 1.110 07/09/2019   Lab Results  Component Value Date   WBC 10.6 02/25/2019   HGB 16.4 02/25/2019   HCT 47.1 02/25/2019   MCV 89.4 02/25/2019   PLT 188 02/25/2019   Lab Results  Component Value Date   NA 135 10/12/2019   K 4.2 10/12/2019   CO2 23 10/12/2019   GLUCOSE 180 (H) 10/12/2019   BUN 13 10/12/2019   CREATININE 1.00 10/12/2019   BILITOT 0.3 10/12/2019   ALKPHOS 70 10/12/2019   AST 19 10/12/2019   ALT 22  10/12/2019   PROT 7.0 10/12/2019   ALBUMIN 4.4 10/12/2019   CALCIUM 9.9 10/12/2019   ANIONGAP  10 09/30/2014   Lab Results  Component Value Date   CHOL 181 10/12/2019   Lab Results  Component Value Date   HDL 32 (L) 10/12/2019   Lab Results  Component Value Date   LDLCALC 118 (H) 10/12/2019   Lab Results  Component Value Date   TRIG 173 (H) 10/12/2019   Lab Results  Component Value Date   CHOLHDL 5.7 (H) 10/12/2019   Lab Results  Component Value Date   HGBA1C 7.3 (H) 10/12/2019

## 2020-01-27 LAB — CMP14+EGFR
ALT: 27 IU/L (ref 0–44)
AST: 17 IU/L (ref 0–40)
Albumin/Globulin Ratio: 1.4 (ref 1.2–2.2)
Albumin: 4.3 g/dL (ref 4.0–5.0)
Alkaline Phosphatase: 72 IU/L (ref 44–121)
BUN/Creatinine Ratio: 11 (ref 9–20)
BUN: 12 mg/dL (ref 6–24)
Bilirubin Total: 0.3 mg/dL (ref 0.0–1.2)
CO2: 24 mmol/L (ref 20–29)
Calcium: 9.9 mg/dL (ref 8.7–10.2)
Chloride: 101 mmol/L (ref 96–106)
Creatinine, Ser: 1.12 mg/dL (ref 0.76–1.27)
GFR calc Af Amer: 92 mL/min/{1.73_m2} (ref >59)
GFR calc non Af Amer: 79 mL/min/{1.73_m2} (ref >59)
Globulin, Total: 3 g/dL (ref 1.5–4.5)
Glucose: 196 mg/dL — ABNORMAL HIGH (ref 65–99)
Potassium: 4.4 mmol/L (ref 3.5–5.2)
Sodium: 139 mmol/L (ref 134–144)
Total Protein: 7.3 g/dL (ref 6.0–8.5)

## 2020-01-27 LAB — MICROALBUMIN / CREATININE URINE RATIO
Creatinine, Urine: 103.4 mg/dL
Microalb/Creat Ratio: 3 mg/g creat (ref 0–29)
Microalbumin, Urine: 3.5 ug/mL

## 2020-01-27 LAB — LIPID PANEL
Chol/HDL Ratio: 5.4 ratio — ABNORMAL HIGH (ref 0.0–5.0)
Cholesterol, Total: 206 mg/dL — ABNORMAL HIGH (ref 100–199)
HDL: 38 mg/dL — ABNORMAL LOW (ref >39)
LDL Chol Calc (NIH): 142 mg/dL — ABNORMAL HIGH (ref 0–99)
Triglycerides: 144 mg/dL (ref 0–149)
VLDL Cholesterol Cal: 26 mg/dL (ref 5–40)

## 2020-01-27 LAB — CBC WITH DIFFERENTIAL/PLATELET
Basophils Absolute: 0 10*3/uL (ref 0.0–0.2)
Basos: 0 %
EOS (ABSOLUTE): 0.3 10*3/uL (ref 0.0–0.4)
Eos: 2 %
Hematocrit: 47.8 % (ref 37.5–51.0)
Hemoglobin: 16.1 g/dL (ref 13.0–17.7)
Immature Grans (Abs): 0 10*3/uL (ref 0.0–0.1)
Immature Granulocytes: 0 %
Lymphocytes Absolute: 3.4 10*3/uL — ABNORMAL HIGH (ref 0.7–3.1)
Lymphs: 31 %
MCH: 30 pg (ref 26.6–33.0)
MCHC: 33.7 g/dL (ref 31.5–35.7)
MCV: 89 fL (ref 79–97)
Monocytes Absolute: 0.7 10*3/uL (ref 0.1–0.9)
Monocytes: 6 %
Neutrophils Absolute: 6.8 10*3/uL (ref 1.4–7.0)
Neutrophils: 61 %
Platelets: 269 10*3/uL (ref 150–450)
RBC: 5.36 x10E6/uL (ref 4.14–5.80)
RDW: 12.6 % (ref 11.6–15.4)
WBC: 11.3 10*3/uL — ABNORMAL HIGH (ref 3.4–10.8)

## 2020-01-29 DIAGNOSIS — E1169 Type 2 diabetes mellitus with other specified complication: Secondary | ICD-10-CM | POA: Insufficient documentation

## 2020-01-29 DIAGNOSIS — E785 Hyperlipidemia, unspecified: Secondary | ICD-10-CM | POA: Insufficient documentation

## 2020-01-29 DIAGNOSIS — E782 Mixed hyperlipidemia: Secondary | ICD-10-CM | POA: Insufficient documentation

## 2020-02-01 DIAGNOSIS — J329 Chronic sinusitis, unspecified: Secondary | ICD-10-CM | POA: Diagnosis not present

## 2020-02-02 DIAGNOSIS — J0100 Acute maxillary sinusitis, unspecified: Secondary | ICD-10-CM | POA: Diagnosis not present

## 2020-03-06 ENCOUNTER — Ambulatory Visit (INDEPENDENT_AMBULATORY_CARE_PROVIDER_SITE_OTHER): Payer: Medicaid Other | Admitting: Gastroenterology

## 2020-03-06 ENCOUNTER — Other Ambulatory Visit: Payer: Self-pay

## 2020-03-06 ENCOUNTER — Encounter (INDEPENDENT_AMBULATORY_CARE_PROVIDER_SITE_OTHER): Payer: Self-pay

## 2020-03-06 ENCOUNTER — Other Ambulatory Visit (INDEPENDENT_AMBULATORY_CARE_PROVIDER_SITE_OTHER): Payer: Self-pay

## 2020-03-06 ENCOUNTER — Telehealth (INDEPENDENT_AMBULATORY_CARE_PROVIDER_SITE_OTHER): Payer: Self-pay

## 2020-03-06 ENCOUNTER — Encounter (INDEPENDENT_AMBULATORY_CARE_PROVIDER_SITE_OTHER): Payer: Self-pay | Admitting: Gastroenterology

## 2020-03-06 DIAGNOSIS — Z1211 Encounter for screening for malignant neoplasm of colon: Secondary | ICD-10-CM

## 2020-03-06 DIAGNOSIS — K5900 Constipation, unspecified: Secondary | ICD-10-CM | POA: Diagnosis not present

## 2020-03-06 DIAGNOSIS — Z800 Family history of malignant neoplasm of digestive organs: Secondary | ICD-10-CM

## 2020-03-06 LAB — TSH: TSH: 0.76 mIU/L (ref 0.40–4.50)

## 2020-03-06 MED ORDER — PEG 3350-KCL-NA BICARB-NACL 420 G PO SOLR: 4000.0000 mL | Freq: Once | ORAL | 0 refills | Status: AC

## 2020-03-06 NOTE — Progress Notes (Signed)
Katrinka Blazing, M.D. Gastroenterology & Hepatology [facility] [address] [place], [place] [address]  Primary Care Physician: Gwenlyn Fudge, FNP [address] [place] [address]  I will communicate my assessment and recommendations to the referring MD via EMR.  Problems: 1. History hepatitis C s/p Mavyret, positive SVR 2. Constipation 3. Family history colon cancer  History of Present Illness: Oscar Cox is a 45 y.o. male with past medical history of hepatitis C s/p Mavyret positive SVR, diabetes, GERD, hyperlipidemia, hypertension, who presents for evaluation of constipation.  The patient was last seen on 02/24/2018. At that time, the patient had an HCVRNA checked which was negative consistent with the medication of hepatitis C.  His liver elastography was also performed was negative for any significant fibrosis..  Patient reports that for the last 6 months he has presented episodes of constipation characterized by straining to have a bowel movement. He has tried to take more fiber (cereal and occasionally Benefiber) in his diet which increased his bowel movements to every 1-2 days but still has to strain a lot. He reports that he has significant dyschezia but even if he is not moving his bowel he has significant rectal pain. He has noticed some blood in his stool intermittently and has reported soreness in the perianal area. He used to have a bowel movement every other day in the past but did not have to strain significantly.  He reports that he has severe dyschezia when having a bowel movement  The patient denies having any nausea, vomiting, fever, chills, hematochezia, melena, hematemesis, abdominal distention, abdominal pain, diarrhea, jaundice, pruritus or weight loss.  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 Acid reflux   . Diabetes mellitus without complication (HCC)   . Essential hypertension, benign 11/27/2016  . Hepatitis C virus infection cured after antiviral drug therapy 09/24/2018  . High cholesterol 11/27/2016    Past Surgical History: Past Surgical History:  Procedure Laterality Date  . NO PAST SURGERIES      Family History: Family History  Problem Relation Age of Onset  . Hypertension Mother   . Breast cancer Mother   . Diabetes Father   . Heart disease Father   . Hypertension Father   . Colon cancer Father   . Diabetes Brother   . Hypertension Brother   . Thyroid cancer Brother        Anaplastic    Social History: Social History   Tobacco Use  Smoking Status Current Every Day Smoker  Smokeless Tobacco Never Used   Social History   Substance and Sexual Activity  Alcohol Use No   Social History   Substance and Sexual Activity  Drug Use Yes  . Types: Marijuana    Allergies: Allergies  Allergen Reactions  . Tamiflu [Oseltamivir Phosphate] Hives  . Metformin And Related Diarrhea  . Morphine Nausea Only  . Potassium Chloride Nausea And Vomiting    Medications: Current Outpatient Medications  Medication Sig Dispense Refill  . Empagliflozin-linaGLIPtin (GLYXAMBI) 25-5 MG TABS Take 1 tablet by mouth daily. 90 tablet 1  . EQ ALLERGY RELIEF, CETIRIZINE, 10 MG tablet Take 1 tablet by mouth once  daily 90 tablet 1  . fluticasone (FLONASE) 50 MCG/ACT nasal spray Use 2 spray(s) in each nostril once daily 16 g 5  . gabapentin (NEURONTIN) 400 MG capsule Take 1 capsule (400 mg total) by mouth 3 (three) times daily. 270 capsule 1  . glipiZIDE (GLUCOTROL XL) 5 MG 24 hr tablet Take 1 tablet (5 mg total) by  mouth daily with breakfast. 90 tablet 1  . lisinopril (ZESTRIL) 40 MG tablet Take 1 tablet (40 mg total) by mouth daily. 90 tablet 1  . omeprazole (PRILOSEC OTC) 20 MG tablet Take 1 tablet (20 mg total) by mouth daily. 90 tablet 1  . pravastatin (PRAVACHOL) 40 MG tablet Take 1 tablet (40 mg total) by mouth daily. 90 tablet 1   No current facility-administered medications for this visit.    Review of Systems: GENERAL: negative for malaise, night sweats HEENT: No changes in hearing or vision, no nose bleeds or other nasal problems. NECK: Negative for lumps, goiter, pain and significant neck swelling RESPIRATORY: Negative for cough, wheezing CARDIOVASCULAR: Negative for chest pain, leg swelling, palpitations, orthopnea GI: SEE HPI MUSCULOSKELETAL: Negative for joint pain or swelling, back pain, and muscle pain. SKIN: Negative for lesions, rash PSYCH: Negative for sleep disturbance, mood disorder and recent psychosocial stressors. HEMATOLOGY Negative for prolonged bleeding, bruising easily, and swollen nodes. ENDOCRINE: Negative for cold or heat intolerance, polyuria, polydipsia and goiter. NEURO: negative for tremor, gait imbalance, syncope and seizures. The remainder of the review of systems is noncontributory.   Physical Exam: BP 91/60 (BP Location: Left Arm, Patient Position: Sitting, Cuff Size: Normal)   Pulse 98   Temp 98.3 F (36.8 C) (Oral)   Ht 6\' 2"  (1.88 m)   Wt 197 lb 1.3 oz (89.4 kg)   BMI 25.30 kg/m  GENERAL: The patient is AO x3, in no acute distress. HEENT: Head is normocephalic and atraumatic. EOMI are intact. Mouth is well hydrated and without lesions. NECK: Supple. No masses LUNGS: Clear to auscultation. No presence of rhonchi/wheezing/rales. Adequate chest expansion HEART: RRR, normal s1 and s2. ABDOMEN: Soft, nontender, no guarding, no peritoneal signs, and nondistended. BS +. No masses. EXTREMITIES: Without any cyanosis, clubbing, rash, lesions or  edema. NEUROLOGIC: AOx3, no focal motor deficit. SKIN: no jaundice, no rashes  Imaging/Labs: as above  I personally reviewed and interpreted the available labs, imaging and endoscopic files.  Impression and Plan: Oscar Cox is a 45 y.o. male with past medical history of hepatitis C s/p Mavyret positive SVR, diabetes, GERD, hyperlipidemia, hypertension, who presents for evaluation of constipation.  The patient has presented worsening dyschezia and significant straining when having a bowel movement.  I advised the patient that if he did not improve while taking Benefiber on a daily frequent basis and also implementing intake of kiwi or prunes, he could start taking MiraLAX on a daily basis and titrate up to have loose bowel movements.  It is very likely that his rectal bleeding episodes are related to possible fissures caused by his constipation.  I will check a TSH today to rule out reversible causes of constipation.  We will also schedule a colonoscopy as the patient's father was diagnosed with colorectal cancer before age 21, he will need to have colonoscopies at least every 5 years.  Patient understood and agreed.  - Schedule colonoscopy - Start eating prune and/or eat kiwi daily and Benefiber fiber supplements daily - If no improvement with Benefiber, start taking Miralax 1 cap every day for one week. If  bowel movements do not improve, increase to 1 cup every 12 hours. If after two weeks there is no improvement, increase to 1 cup every 8 hours  All questions were answered.      Zymere Patlan Castaneda Mayorga, MD Gastroenterology and Hepatology [facility]  

## 2020-03-06 NOTE — Patient Instructions (Signed)
Schedule colonoscopy Start eating prune and/or eat kiwi daily and Benefiber fiber supplements daily If no improvement with Benefiber, start taking Miralax 1 cap every day for one week. If bowel movements do not improve, increase to 1 cup every 12 hours. If after two weeks there is no improvement, increase to 1 cup every 8 hours

## 2020-03-06 NOTE — H&P (View-Only) (Signed)
Katrinka Blazing, M.D. Gastroenterology & Hepatology [facility] [address] [place], [place] [address]  Primary Care Physician: Gwenlyn Fudge, FNP [address] [place] [address]  I will communicate my assessment and recommendations to the referring MD via EMR.  Problems: 1. History hepatitis C s/p Mavyret, positive SVR 2. Constipation 3. Family history colon cancer  History of Present Illness: Oscar Cox is a 45 y.o. male with past medical history of hepatitis C s/p Mavyret positive SVR, diabetes, GERD, hyperlipidemia, hypertension, who presents for evaluation of constipation.  The patient was last seen on 02/24/2018. At that time, the patient had an HCVRNA checked which was negative consistent with the medication of hepatitis C.  His liver elastography was also performed was negative for any significant fibrosis..  Patient reports that for the last 6 months he has presented episodes of constipation characterized by straining to have a bowel movement. He has tried to take more fiber (cereal and occasionally Benefiber) in his diet which increased his bowel movements to every 1-2 days but still has to strain a lot. He reports that he has significant dyschezia but even if he is not moving his bowel he has significant rectal pain. He has noticed some blood in his stool intermittently and has reported soreness in the perianal area. He used to have a bowel movement every other day in the past but did not have to strain significantly.  He reports that he has severe dyschezia when having a bowel movement  The patient denies having any nausea, vomiting, fever, chills, hematochezia, melena, hematemesis, abdominal distention, abdominal pain, diarrhea, jaundice, pruritus or weight loss.  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 Acid reflux   . Diabetes mellitus without complication (HCC)   . Essential hypertension, benign 11/27/2016  . Hepatitis C virus infection cured after antiviral drug therapy 09/24/2018  . High cholesterol 11/27/2016    Past Surgical History: Past Surgical History:  Procedure Laterality Date  . NO PAST SURGERIES      Family History: Family History  Problem Relation Age of Onset  . Hypertension Mother   . Breast cancer Mother   . Diabetes Father   . Heart disease Father   . Hypertension Father   . Colon cancer Father   . Diabetes Brother   . Hypertension Brother   . Thyroid cancer Brother        Anaplastic    Social History: Social History   Tobacco Use  Smoking Status Current Every Day Smoker  Smokeless Tobacco Never Used   Social History   Substance and Sexual Activity  Alcohol Use No   Social History   Substance and Sexual Activity  Drug Use Yes  . Types: Marijuana    Allergies: Allergies  Allergen Reactions  . Tamiflu [Oseltamivir Phosphate] Hives  . Metformin And Related Diarrhea  . Morphine Nausea Only  . Potassium Chloride Nausea And Vomiting    Medications: Current Outpatient Medications  Medication Sig Dispense Refill  . Empagliflozin-linaGLIPtin (GLYXAMBI) 25-5 MG TABS Take 1 tablet by mouth daily. 90 tablet 1  . EQ ALLERGY RELIEF, CETIRIZINE, 10 MG tablet Take 1 tablet by mouth once  daily 90 tablet 1  . fluticasone (FLONASE) 50 MCG/ACT nasal spray Use 2 spray(s) in each nostril once daily 16 g 5  . gabapentin (NEURONTIN) 400 MG capsule Take 1 capsule (400 mg total) by mouth 3 (three) times daily. 270 capsule 1  . glipiZIDE (GLUCOTROL XL) 5 MG 24 hr tablet Take 1 tablet (5 mg total) by  mouth daily with breakfast. 90 tablet 1  . lisinopril (ZESTRIL) 40 MG tablet Take 1 tablet (40 mg total) by mouth daily. 90 tablet 1  . omeprazole (PRILOSEC OTC) 20 MG tablet Take 1 tablet (20 mg total) by mouth daily. 90 tablet 1  . pravastatin (PRAVACHOL) 40 MG tablet Take 1 tablet (40 mg total) by mouth daily. 90 tablet 1   No current facility-administered medications for this visit.    Review of Systems: GENERAL: negative for malaise, night sweats HEENT: No changes in hearing or vision, no nose bleeds or other nasal problems. NECK: Negative for lumps, goiter, pain and significant neck swelling RESPIRATORY: Negative for cough, wheezing CARDIOVASCULAR: Negative for chest pain, leg swelling, palpitations, orthopnea GI: SEE HPI MUSCULOSKELETAL: Negative for joint pain or swelling, back pain, and muscle pain. SKIN: Negative for lesions, rash PSYCH: Negative for sleep disturbance, mood disorder and recent psychosocial stressors. HEMATOLOGY Negative for prolonged bleeding, bruising easily, and swollen nodes. ENDOCRINE: Negative for cold or heat intolerance, polyuria, polydipsia and goiter. NEURO: negative for tremor, gait imbalance, syncope and seizures. The remainder of the review of systems is noncontributory.   Physical Exam: BP 91/60 (BP Location: Left Arm, Patient Position: Sitting, Cuff Size: Normal)   Pulse 98   Temp 98.3 F (36.8 C) (Oral)   Ht 6\' 2"  (1.88 m)   Wt 197 lb 1.3 oz (89.4 kg)   BMI 25.30 kg/m  GENERAL: The patient is AO x3, in no acute distress. HEENT: Head is normocephalic and atraumatic. EOMI are intact. Mouth is well hydrated and without lesions. NECK: Supple. No masses LUNGS: Clear to auscultation. No presence of rhonchi/wheezing/rales. Adequate chest expansion HEART: RRR, normal s1 and s2. ABDOMEN: Soft, nontender, no guarding, no peritoneal signs, and nondistended. BS +. No masses. EXTREMITIES: Without any cyanosis, clubbing, rash, lesions or  edema. NEUROLOGIC: AOx3, no focal motor deficit. SKIN: no jaundice, no rashes  Imaging/Labs: as above  I personally reviewed and interpreted the available labs, imaging and endoscopic files.  Impression and Plan: Oscar Cox is a 45 y.o. male with past medical history of hepatitis C s/p Mavyret positive SVR, diabetes, GERD, hyperlipidemia, hypertension, who presents for evaluation of constipation.  The patient has presented worsening dyschezia and significant straining when having a bowel movement.  I advised the patient that if he did not improve while taking Benefiber on a daily frequent basis and also implementing intake of kiwi or prunes, he could start taking MiraLAX on a daily basis and titrate up to have loose bowel movements.  It is very likely that his rectal bleeding episodes are related to possible fissures caused by his constipation.  I will check a TSH today to rule out reversible causes of constipation.  We will also schedule a colonoscopy as the patient's father was diagnosed with colorectal cancer before age 21, he will need to have colonoscopies at least every 5 years.  Patient understood and agreed.  - Schedule colonoscopy - Start eating prune and/or eat kiwi daily and Benefiber fiber supplements daily - If no improvement with Benefiber, start taking Miralax 1 cap every day for one week. If  bowel movements do not improve, increase to 1 cup every 12 hours. If after two weeks there is no improvement, increase to 1 cup every 8 hours  All questions were answered.      Dolores Frame, MD Gastroenterology and Hepatology [facility]

## 2020-03-06 NOTE — Telephone Encounter (Signed)
LeighAnn Zunairah Devers, CMA  

## 2020-03-13 ENCOUNTER — Other Ambulatory Visit: Payer: Self-pay

## 2020-03-13 ENCOUNTER — Encounter: Payer: Self-pay | Admitting: Nurse Practitioner

## 2020-03-13 ENCOUNTER — Ambulatory Visit (INDEPENDENT_AMBULATORY_CARE_PROVIDER_SITE_OTHER): Payer: Medicaid Other

## 2020-03-13 ENCOUNTER — Ambulatory Visit (INDEPENDENT_AMBULATORY_CARE_PROVIDER_SITE_OTHER): Payer: Medicaid Other | Admitting: Nurse Practitioner

## 2020-03-13 VITALS — BP 112/78 | HR 114 | Temp 97.4°F | Ht 74.0 in | Wt 200.8 lb

## 2020-03-13 DIAGNOSIS — S3992XA Unspecified injury of lower back, initial encounter: Secondary | ICD-10-CM | POA: Diagnosis not present

## 2020-03-13 DIAGNOSIS — S300XXA Contusion of lower back and pelvis, initial encounter: Secondary | ICD-10-CM

## 2020-03-13 MED ORDER — METHYLPREDNISOLONE ACETATE 40 MG/ML IJ SUSP: 40.0000 mg | Freq: Once | INTRAMUSCULAR | Status: DC

## 2020-03-13 MED ORDER — IBUPROFEN 600 MG PO TABS: 600.0000 mg | ORAL_TABLET | Freq: Three times a day (TID) | ORAL | 0 refills | Status: DC | PRN

## 2020-03-13 NOTE — Patient Instructions (Signed)
Tailbone Injury The tailbone is the small bone at the lower end of the backbone (spine). The tailbone can become injured from:  A fall.  Sitting to row or bike for a long time.  Having a baby. This type of injury can be painful. Most tailbone injuries get better on their own in 4-6 weeks. Follow these instructions at home: Activity  Avoid sitting in one place for a long time.  Wear proper pads and gear when riding a bike or rowing.  Increase your activity as the pain allows.  Do exercises as told by your doctor or physical therapist. Managing pain, stiffness, and swelling  To lessen pain: ? Sit on a large, rubber or inflated ring or cushion. ? Lean forward when you sit.  If told, apply ice to the injured area. ? Put ice in a plastic bag. ? Place a towel between your skin and the bag. ? Leave the ice on for 20 minutes, 2-3 times per day. Do this for the first 1-2 days.  If told, put heat on the injured area. Do this as often as told by your doctor. Use the heat source that your doctor recommends, such as a moist heat pack or a heating pad. ? Place a towel between your skin and the heat source. ? Leave the heat on for 20-30 minutes. ? Remove the heat if your skin turns bright red. This is very important if you are unable to feel pain, heat, or cold. You may have a greater risk of getting burned. General instructions  Take over-the-counter and prescription medicines only as told by your doctor.  To prevent or treat trouble pooping (constipation) or pain when pooping, your doctor may suggest that you: ? Drink enough fluid to keep your pee (urine) pale yellow. ? Eat foods that are high in fiber. These include fresh fruits and vegetables, whole grains, and beans. ? Limit foods that are high in fat and sugar. These include fried and sweet foods. ? Take an over-the-counter or prescription medicine to treat trouble pooping.  Keep all follow-up visits as told by your doctor. This is  important. Contact a doctor if:  Your pain gets worse.  Pooping causes you pain.  You cannot poop after 4 days.  You have pain during sex. Summary  A tailbone injury can happen from a fall, from sitting for a long time to row or bike, or after having a baby.  These injuries can be painful. Most tailbone injuries get better on their own in 4-6 weeks.  Sit on a large, rubber or inflated ring or cushion to lessen pain.  Avoid sitting in one place for a long time.  Follow your doctor's suggestions to prevent or treat trouble pooping. This information is not intended to replace advice given to you by your health care provider. Make sure you discuss any questions you have with your health care provider. Document Revised: 01/28/2017 Document Reviewed: 01/28/2017 [employer] Patient Education  2021 [employer]  

## 2020-03-13 NOTE — Progress Notes (Signed)
Acute Office Visit  Subjective:    Patient ID: Oscar Cox, male    DOB: [birth year], 45 y.o.   MRN: [id number]  Chief Complaint  Patient presents with  . Tailbone Pain    HPI Patient is in today for Pain  He reports new onset Coccyx pain. was an injury that may have caused the pain. The pain started four days ago and is gradually worsening. The pain does not radiate . The pain is described as soreness and throbbing, is moderate in intensity, occurring constantly. Symptoms are worse in the: morning, mid-day, afternoon, evening  Aggravating factors: sitting Relieving factors: none.  He has tried application of heat, application of ice and acetaminophen with little relief.   ---------------------------------------------------------------------------------------------------   Past Medical History:  Diagnosis Date  . Acid reflux   . Diabetes mellitus without complication (HCC)   . Essential hypertension, benign 11/27/2016  . Hepatitis C virus infection cured after antiviral drug therapy 09/24/2018  . High cholesterol 11/27/2016    Past Surgical History:  Procedure Laterality Date  . NO PAST SURGERIES      Family History  Problem Relation Age of Onset  . Hypertension Mother   . Breast cancer Mother   . Diabetes Father   . Heart disease Father   . Hypertension Father   . Colon cancer Father   . Diabetes Brother   . Hypertension Brother   . Thyroid cancer Brother        Anaplastic    Social History   Socioeconomic History  . Marital status: Single    Spouse name: Not on file  . Number of children: Not on file  . Years of education: Not on file  . Highest education level: Not on file  Occupational History  . Not on file  Tobacco Use  . Smoking status: Current Every Day Smoker  . Smokeless tobacco: Never Used  Substance and Sexual Activity  . Alcohol use: No  . Drug use: Yes    Types: Marijuana  . Sexual activity: Not on file  Other Topics Concern  .  Not on file  Social History Narrative  . Not on file   Social Determinants of Health   Financial Resource Strain: Not on file  Food Insecurity: Not on file  Transportation Needs: Not on file  Physical Activity: Not on file  Stress: Not on file  Social Connections: Not on file  Intimate Partner Violence: Not on file    Outpatient Medications Prior to Visit  Medication Sig Dispense Refill  . acetaminophen (TYLENOL) 500 MG tablet Take 1,000 mg by mouth every 8 (eight) hours as needed (pain.).    Marland Kitchen Empagliflozin-linaGLIPtin (GLYXAMBI) 25-5 MG TABS Take 1 tablet by mouth daily. 90 tablet 1  . EQ ALLERGY RELIEF, CETIRIZINE, 10 MG tablet Take 1 tablet by mouth once daily (Patient taking differently: Take 10 mg by mouth daily.) 90 tablet 1  . fluticasone (FLONASE) 50 MCG/ACT nasal spray Use 2 spray(s) in each nostril once daily (Patient taking differently: Place 2 sprays into both nostrils daily.) 16 g 5  . gabapentin (NEURONTIN) 400 MG capsule Take 1 capsule (400 mg total) by mouth 3 (three) times daily. 270 capsule 1  . glipiZIDE (GLUCOTROL XL) 5 MG 24 hr tablet Take 1 tablet (5 mg total) by mouth daily with breakfast. 90 tablet 1  . Lidocaine-Hydrocortisone Ace 1-1 % CREA Place 1 application rectally as needed (hemorrhoids). Preparation H with lidocaine    . lisinopril (ZESTRIL) 40  MG tablet Take 1 tablet (40 mg total) by mouth daily. 90 tablet 1  . omeprazole (PRILOSEC OTC) 20 MG tablet Take 1 tablet (20 mg total) by mouth daily. 90 tablet 1  . pravastatin (PRAVACHOL) 40 MG tablet Take 1 tablet (40 mg total) by mouth daily. 90 tablet 1  . ibuprofen (ADVIL) 200 MG tablet Take 400 mg by mouth every 8 (eight) hours as needed (pain.).     No facility-administered medications prior to visit.    Allergies  Allergen Reactions  . Tamiflu [Oseltamivir Phosphate] Hives  . Metformin And Related Diarrhea  . Morphine Nausea Only  . Potassium Chloride Nausea And Vomiting    Review of Systems   Constitutional: Negative.   HENT: Negative.   Respiratory: Negative.   Gastrointestinal: Negative.   Genitourinary: Negative.   Musculoskeletal: Positive for back pain.       Coccyx pain  Skin: Negative.   All other systems reviewed and are negative.      Objective:    Physical Exam Vitals reviewed.  Constitutional:      Appearance: Normal appearance.  HENT:     Head: Normocephalic.     Nose: Nose normal.  Eyes:     Conjunctiva/sclera: Conjunctivae normal.  Cardiovascular:     Rate and Rhythm: Normal rate and regular rhythm.     Pulses: Normal pulses.     Heart sounds: Normal heart sounds.  Pulmonary:     Effort: Pulmonary effort is normal.     Breath sounds: Normal breath sounds.  Abdominal:     General: Bowel sounds are normal.  Musculoskeletal:        General: Tenderness present.  Skin:    General: Skin is warm.  Neurological:     Mental Status: He is alert and oriented to person, place, and time.  Psychiatric:        Behavior: Behavior normal.     BP 112/78   Pulse (!) 114   Temp (!) 97.4 F (36.3 C)   Ht 6\' 2"  (1.88 m)   Wt 200 lb 12.8 oz (91.1 kg)   SpO2 99%   BMI 25.78 kg/m  Wt Readings from Last 3 Encounters:  03/13/20 200 lb 12.8 oz (91.1 kg)  03/06/20 197 lb 1.3 oz (89.4 kg)  01/26/20 193 lb (87.5 kg)    Health Maintenance Due  Topic Date Due  . HIV Screening  Never done    There are no preventive care reminders to display for this patient.   Lab Results  Component Value Date   TSH 0.76 03/06/2020   Lab Results  Component Value Date   WBC 11.3 (H) 01/26/2020   HGB 16.1 01/26/2020   HCT 47.8 01/26/2020   MCV 89 01/26/2020   PLT 269 01/26/2020   Lab Results  Component Value Date   NA 139 01/26/2020   K 4.4 01/26/2020   CO2 24 01/26/2020   GLUCOSE 196 (H) 01/26/2020   BUN 12 01/26/2020   CREATININE 1.12 01/26/2020   BILITOT 0.3 01/26/2020   ALKPHOS 72 01/26/2020   AST 17 01/26/2020   ALT 27 01/26/2020   PROT 7.3  01/26/2020   ALBUMIN 4.3 01/26/2020   CALCIUM 9.9 01/26/2020   ANIONGAP 10 09/30/2014   Lab Results  Component Value Date   CHOL 206 (H) 01/26/2020   Lab Results  Component Value Date   HDL 38 (L) 01/26/2020   Lab Results  Component Value Date   LDLCALC 142 (H) 01/26/2020  Lab Results  Component Value Date   TRIG 144 01/26/2020   Lab Results  Component Value Date   CHOLHDL 5.4 (H) 01/26/2020   Lab Results  Component Value Date   HGBA1C 7.9 (H) 01/26/2020       Assessment & Plan:  Coccyx contusion, initial encounter Patient reports sitting down on a  broken chair and hitting his tailbone directly on a sharp point in the chair.  Pain has since worsened in the last 4 days.  Patient rates his pain as 5 out of 10 from a pain scale of 0-10.  Patient reports having a history of tailbone injury several years ago.  X-ray to the coccyx completed with results pending.  Advised patient to use anti-inflammatory, hot or cold pack as tolerated, which foam over sit to provide cushion.  Depo-Medrol shot given in clinic. Follow-up with worsening unresolved symptoms. Rx sent to pharmacy.  Problem List Items Addressed This Visit      Other   Coccyx contusion, initial encounter - Primary      Relevant Medications   ibuprofen (ADVIL) 600 MG tablet   methylPREDNISolone acetate (DEPO-MEDROL) injection 40 mg   Other Relevant Orders   DG Sacrum/Coccyx       Meds ordered this encounter  Medications  . ibuprofen (ADVIL) 600 MG tablet    Sig: Take 1 tablet (600 mg total) by mouth every 8 (eight) hours as needed.    Dispense:  30 tablet    Refill:  0    Order Specific Question:   Supervising Provider    Answer:   Raliegh Ip [[id number]]  . methylPREDNISolone acetate (DEPO-MEDROL) injection 40 mg     Daryll Drown, NP

## 2020-03-13 NOTE — Assessment & Plan Note (Addendum)
Patient reports sitting down on a  broken chair and hitting his tailbone directly on a sharp point in the chair.  Pain has since worsened in the last 4 days.  Patient rates his pain as 5 out of 10 from a pain scale of 0-10.  Patient reports having a history of tailbone injury several years ago.  X-ray to the coccyx completed with results pending.  Advised patient to use anti-inflammatory, hot or cold pack as tolerated, which foam over sit to provide cushion.  Depo-Medrol shot given in clinic. Follow-up with worsening unresolved symptoms. Rx sent to pharmacy.

## 2020-03-14 ENCOUNTER — Telehealth: Payer: Self-pay

## 2020-03-14 NOTE — Telephone Encounter (Signed)
Patient is calling about his xray that was done yesterday with JE. Please advise and send back to pools

## 2020-03-20 ENCOUNTER — Other Ambulatory Visit: Payer: Self-pay

## 2020-03-20 ENCOUNTER — Other Ambulatory Visit (HOSPITAL_COMMUNITY): Payer: Medicaid Other

## 2020-03-20 ENCOUNTER — Other Ambulatory Visit (HOSPITAL_COMMUNITY)
Admission: RE | Admit: 2020-03-20 | Discharge: 2020-03-20 | Disposition: A | Discharge status: Home / Self Care | Payer: Medicaid Other | Source: Ambulatory Visit | Attending: Gastroenterology | Admitting: Gastroenterology

## 2020-03-20 DIAGNOSIS — Z20822 Contact with and (suspected) exposure to covid-19: Secondary | ICD-10-CM | POA: Diagnosis not present

## 2020-03-20 DIAGNOSIS — Z01812 Encounter for preprocedural laboratory examination: Secondary | ICD-10-CM | POA: Diagnosis not present

## 2020-03-20 LAB — SARS CORONAVIRUS 2 (TAT 6-24 HRS): SARS Coronavirus 2: NEGATIVE

## 2020-03-21 ENCOUNTER — Ambulatory Visit (HOSPITAL_COMMUNITY): Payer: Medicaid Other | Admitting: Anesthesiology

## 2020-03-21 ENCOUNTER — Ambulatory Visit (HOSPITAL_COMMUNITY)
Admission: RE | Admit: 2020-03-21 | Discharge: 2020-03-21 | Disposition: A | Discharge status: Home / Self Care | Payer: Medicaid Other | Source: Ambulatory Visit | Attending: Gastroenterology | Admitting: Gastroenterology

## 2020-03-21 ENCOUNTER — Other Ambulatory Visit: Payer: Self-pay

## 2020-03-21 ENCOUNTER — Encounter (INDEPENDENT_AMBULATORY_CARE_PROVIDER_SITE_OTHER): Payer: Self-pay | Admitting: *Deleted

## 2020-03-21 ENCOUNTER — Encounter (HOSPITAL_COMMUNITY): Payer: Self-pay | Admitting: Gastroenterology

## 2020-03-21 ENCOUNTER — Encounter (HOSPITAL_COMMUNITY)
Admission: RE | Disposition: A | Discharge status: Home / Self Care | Payer: Self-pay | Source: Ambulatory Visit | Attending: Gastroenterology

## 2020-03-21 DIAGNOSIS — K648 Other hemorrhoids: Secondary | ICD-10-CM | POA: Insufficient documentation

## 2020-03-21 DIAGNOSIS — K5730 Diverticulosis of large intestine without perforation or abscess without bleeding: Secondary | ICD-10-CM | POA: Insufficient documentation

## 2020-03-21 DIAGNOSIS — F17210 Nicotine dependence, cigarettes, uncomplicated: Secondary | ICD-10-CM | POA: Insufficient documentation

## 2020-03-21 DIAGNOSIS — E119 Type 2 diabetes mellitus without complications: Secondary | ICD-10-CM | POA: Insufficient documentation

## 2020-03-21 DIAGNOSIS — K219 Gastro-esophageal reflux disease without esophagitis: Secondary | ICD-10-CM | POA: Insufficient documentation

## 2020-03-21 DIAGNOSIS — Z8619 Personal history of other infectious and parasitic diseases: Secondary | ICD-10-CM | POA: Diagnosis not present

## 2020-03-21 DIAGNOSIS — Z808 Family history of malignant neoplasm of other organs or systems: Secondary | ICD-10-CM | POA: Insufficient documentation

## 2020-03-21 DIAGNOSIS — Z1211 Encounter for screening for malignant neoplasm of colon: Secondary | ICD-10-CM | POA: Diagnosis not present

## 2020-03-21 DIAGNOSIS — E1140 Type 2 diabetes mellitus with diabetic neuropathy, unspecified: Secondary | ICD-10-CM | POA: Diagnosis not present

## 2020-03-21 DIAGNOSIS — Z8249 Family history of ischemic heart disease and other diseases of the circulatory system: Secondary | ICD-10-CM | POA: Insufficient documentation

## 2020-03-21 DIAGNOSIS — Z885 Allergy status to narcotic agent status: Secondary | ICD-10-CM | POA: Insufficient documentation

## 2020-03-21 DIAGNOSIS — Z800 Family history of malignant neoplasm of digestive organs: Secondary | ICD-10-CM

## 2020-03-21 DIAGNOSIS — Z803 Family history of malignant neoplasm of breast: Secondary | ICD-10-CM | POA: Insufficient documentation

## 2020-03-21 DIAGNOSIS — Z833 Family history of diabetes mellitus: Secondary | ICD-10-CM | POA: Diagnosis not present

## 2020-03-21 DIAGNOSIS — Z888 Allergy status to other drugs, medicaments and biological substances status: Secondary | ICD-10-CM | POA: Diagnosis not present

## 2020-03-21 DIAGNOSIS — Z79899 Other long term (current) drug therapy: Secondary | ICD-10-CM | POA: Insufficient documentation

## 2020-03-21 DIAGNOSIS — K5900 Constipation, unspecified: Secondary | ICD-10-CM | POA: Diagnosis not present

## 2020-03-21 HISTORY — PX: COLONOSCOPY WITH PROPOFOL: SHX5780

## 2020-03-21 LAB — HM COLONOSCOPY

## 2020-03-21 LAB — GLUCOSE, CAPILLARY: Glucose-Capillary: 99 mg/dL (ref 70–99)

## 2020-03-21 SURGERY — COLONOSCOPY WITH PROPOFOL
Anesthesia: General

## 2020-03-21 MED ORDER — PROPOFOL 500 MG/50ML IV EMUL
INTRAVENOUS | Status: DC | PRN
  Administered 2020-03-21: 100 ug/kg/min via INTRAVENOUS

## 2020-03-21 MED ORDER — LIDOCAINE HCL (CARDIAC) PF 100 MG/5ML IV SOSY
PREFILLED_SYRINGE | INTRAVENOUS | Status: DC | PRN
  Administered 2020-03-21: 50 mg via INTRAVENOUS

## 2020-03-21 MED ORDER — PROPOFOL 10 MG/ML IV BOLUS
INTRAVENOUS | Status: DC | PRN
  Administered 2020-03-21: 100 mg via INTRAVENOUS

## 2020-03-21 MED ORDER — LACTATED RINGERS IV SOLN: INTRAVENOUS | Status: DC

## 2020-03-21 NOTE — Anesthesia Preprocedure Evaluation (Addendum)
Anesthesia Evaluation  Patient identified by MRN, date of birth, ID band Patient awake    Reviewed: Allergy & Precautions, NPO status , Patient's Chart, lab work & pertinent test results  History of Anesthesia Complications Negative for: history of anesthetic complications  Airway Mallampati: III  TM Distance: >3 FB Neck ROM: Full   Comment: Neck pain  Dental  (+) Dental Advisory Given, Teeth Intact   Pulmonary Current Smoker and Patient abstained from smoking.,    Pulmonary exam normal breath sounds clear to auscultation       Cardiovascular Exercise Tolerance: Good hypertension, Pt. on medications Normal cardiovascular exam Rhythm:Regular Rate:Normal     Neuro/Psych  Neuromuscular disease (neuropathy) negative psych ROS   GI/Hepatic Neg liver ROS, GERD  Medicated and Controlled,  Endo/Other  diabetes, Well Controlled, Type 2, Oral Hypoglycemic Agents  Renal/GU negative Renal ROS     Musculoskeletal  (+) Arthritis  (back pain, neck pain),   Abdominal   Peds  Hematology negative hematology ROS (+)   Anesthesia Other Findings   Reproductive/Obstetrics negative OB ROS                           Anesthesia Physical Anesthesia Plan  ASA: II  Anesthesia Plan: General   Post-op Pain Management:    Induction: Intravenous  PONV Risk Score and Plan: TIVA and Propofol infusion  Airway Management Planned: Nasal Cannula and Natural Airway  Additional Equipment:   Intra-op Plan:   Post-operative Plan:   Informed Consent: I have reviewed the patients History and Physical, chart, labs and discussed the procedure including the risks, benefits and alternatives for the proposed anesthesia with the patient or authorized representative who has indicated his/her understanding and acceptance.     Dental advisory given  Plan Discussed with: CRNA and Surgeon  Anesthesia Plan Comments:          Anesthesia Quick Evaluation

## 2020-03-21 NOTE — Interval H&P Note (Signed)
History and Physical Interval Note:  03/21/2020 8:40 AM Oscar Cox is a 45 y.o. male with past medical history of hepatitis C s/p Mavyret positive SVR, diabetes, GERD, hyperlipidemia, hypertension, who presents for colorectal cancer screening due to family history of colon cancer.  The patient has never had a colonoscopy in the past.  The patient denies having any complaints such as melena, hematochezia, abdominal pain or distention, diarrhea, no changes in his weight recently.  Patient reports that his father was diagnosed with colon cancer at age 8.  HR 78 RR 16 Sat O2 98%  GENERAL: The patient is AO x3, in no acute distress. HEENT: Head is normocephalic and atraumatic. EOMI are intact. Mouth is well hydrated and without lesions. NECK: Supple. No masses LUNGS: Clear to auscultation. No presence of rhonchi/wheezing/rales. Adequate chest expansion HEART: RRR, normal s1 and s2. ABDOMEN: Soft, nontender, no guarding, no peritoneal signs, and nondistended. BS +. No masses. EXTREMITIES: Without any cyanosis, clubbing, rash, lesions or edema. NEUROLOGIC: AOx3, no focal motor deficit. SKIN: no jaundice, no rashes  Oscar Cox  has presented today for surgery, with the diagnosis of Family Hx of colon ca.  The various methods of treatment have been discussed with the patient and family. After consideration of risks, benefits and other options for treatment, the patient has consented to  Procedure(s) with comments: COLONOSCOPY WITH PROPOFOL (N/A) - am as a surgical intervention.  The patient's history has been reviewed, patient examined, no change in status, stable for surgery.  I have reviewed the patient's chart and labs.  Questions were answered to the patient's satisfaction.     Katrinka Blazing Mayorga

## 2020-03-21 NOTE — Anesthesia Postprocedure Evaluation (Signed)
Anesthesia Post Note  Patient: Oscar Cox  Procedure(s) Performed: COLONOSCOPY WITH PROPOFOL (N/A )  Patient location during evaluation: Phase II Anesthesia Type: General Level of consciousness: awake, oriented, awake and alert and patient cooperative Pain management: satisfactory to patient Vital Signs Assessment: post-procedure vital signs reviewed and stable Respiratory status: spontaneous breathing, respiratory function stable and nonlabored ventilation Cardiovascular status: stable Postop Assessment: no apparent nausea or vomiting Anesthetic complications: no   No complications documented.   Last Vitals:  Vitals:   03/21/20 0843 03/21/20 1020  BP: 101/77 93/64  Pulse:  88  Resp: 18 13  Temp: 36.9 C 36.5 C  SpO2: 97% 100%    Last Pain:  Vitals:   03/21/20 1020  TempSrc: Oral  PainSc:                  Madison Hickman

## 2020-03-21 NOTE — Op Note (Signed)
[facility] Patient Name: Oscar Cox Procedure Date: 03/21/2020 9:47 AM MRN: [id number] Date of Birth: 1975-11-13 Attending MD: Katrinka Blazing ,  CSN: [id number] Age: 45 Admit Type: Outpatient Procedure:                Colonoscopy Indications:              Screening in patient at increased risk: Family                            history of 1st-degree relative with colorectal                            cancer before age 30 years Providers:                Katrinka Blazing, Criselda Peaches. Patsy Lager, RN, Pandora Leiter, Technician Referring MD:              Medicines:                Monitored Anesthesia Care Complications:            No immediate complications. Estimated Blood Loss:     Estimated blood loss: none. Procedure:                Pre-Anesthesia Assessment:                           - Prior to the procedure, a History and Physical                            was performed, and patient medications, allergies                            and sensitivities were reviewed. The patient's                            tolerance of previous anesthesia was reviewed.                           - The risks and benefits of the procedure and the                            sedation options and risks were discussed with the                            patient. All questions were answered and informed                            consent was obtained.                           - ASA Grade Assessment: I - A normal, healthy                            patient.  After obtaining informed consent, the colonoscope                            was passed under direct vision. Throughout the                            procedure, the patient's blood pressure, pulse, and                            oxygen saturations were monitored continuously. The                            [id number]-[id number] ([id number]) scope was introduced through                            the anus  and advanced to the the terminal ileum.                            The colonoscopy was performed without difficulty.                            The patient tolerated the procedure well. The                            quality of the bowel preparation was good. Scope                            withdrawal time was 13 minutes. Scope In: 9:56:05 AM Scope Out: 10:16:56 AM Scope Withdrawal Time: 0 hours 16 minutes 39 seconds  Total Procedure Duration: 0 hours 20 minutes 51 seconds  Findings:      The perianal and digital rectal examinations were normal.      The terminal ileum appeared normal.      A few small and large-mouthed diverticula were found in the sigmoid       colon, descending colon and ascending colon.      Non-bleeding internal hemorrhoids were found during retroflexion. The       hemorrhoids were small. Impression:               - The examined portion of the ileum was normal.                           - Diverticulosis in the sigmoid colon, in the                            descending colon and in the ascending colon.                           - Non-bleeding internal hemorrhoids.                           - No specimens collected. Moderate Sedation:      Per Anesthesia Care Recommendation:           - Discharge patient to home (ambulatory).                           -  Resume previous diet.                           - Repeat colonoscopy in 5 years for screening                            purposes. Procedure Code(s):        --- Professional ---                           R6789, Colorectal cancer screening; colonoscopy on                            individual at high risk Diagnosis Code(s):        --- Professional ---                           Z80.0, Family history of malignant neoplasm of                            digestive organs                           K64.8, Other hemorrhoids                           K57.30, Diverticulosis of large intestine without                             perforation or abscess without bleeding CPT copyright 2019 [employer]. All rights reserved. The codes documented in this report are preliminary and upon coder review may  be revised to meet current compliance requirements. Katrinka Blazing, MD Katrinka Blazing,  03/21/2020 10:20:32 AM This report has been signed electronically. Number of Addenda: 0

## 2020-03-21 NOTE — Transfer of Care (Signed)
Immediate Anesthesia Transfer of Care Note  Patient: Oscar Cox  Procedure(s) Performed: COLONOSCOPY WITH PROPOFOL (N/A )  Patient Location: PACU  Anesthesia Type:General  Level of Consciousness: awake, alert , oriented and patient cooperative  Airway & Oxygen Therapy: Patient Spontanous Breathing  Post-op Assessment: Report given to RN, Post -op Vital signs reviewed and stable and Patient moving all extremities X 4  Post vital signs: Reviewed and stable  Last Vitals:  Vitals Value Taken Time  BP    Temp    Pulse    Resp    SpO2      Last Pain:  Vitals:   03/21/20 0947  TempSrc:   PainSc: 0-No pain      Patients Stated Pain Goal: 5 (03/21/20 0843)  Complications: No complications documented.

## 2020-03-21 NOTE — Discharge Instructions (Signed)
Colonoscopy, Adult, Care After This sheet gives you information about how to care for yourself after your procedure. Your doctor may also give you more specific instructions. If you have problems or questions, call your doctor. What can I expect after the procedure? After the procedure, it is common to have:  A small amount of blood in your poop (stool) for 24 hours.  Some gas.  Mild cramping or bloating in your belly (abdomen). Follow these instructions at home: Eating and drinking  Drink enough fluid to keep your pee (urine) pale yellow.  Follow instructions from your doctor about what you cannot eat or drink.  Return to your normal diet as told by your doctor. Avoid heavy or fried foods that are hard to digest.   Activity  Rest as told by your doctor.  Do not sit for a long time without moving. Get up to take short walks every 1-2 hours. This is important. Ask for help if you feel weak or unsteady.  Return to your normal activities as told by your doctor. Ask your doctor what activities are safe for you. To help cramping and bloating:  Try walking around.  Put heat on your belly as told by your doctor. Use the heat source that your doctor recommends, such as a moist heat pack or a heating pad. ? Put a towel between your skin and the heat source. ? Leave the heat on for 20-30 minutes. ? Remove the heat if your skin turns bright red. This is very important if you are unable to feel pain, heat, or cold. You may have a greater risk of getting burned.   General instructions  If you were given a medicine to help you relax (sedative) during your procedure, it can affect you for many hours. Do not drive or use machinery until your doctor says that it is safe.  For the first 24 hours after the procedure: ? Do not sign important documents. ? Do not drink alcohol. ? Do your daily activities more slowly than normal. ? Eat foods that are soft and easy to digest.  Take  over-the-counter or prescription medicines only as told by your doctor.  Keep all follow-up visits as told by your doctor. This is important. Contact a doctor if:  You have blood in your poop 2-3 days after the procedure. Get help right away if:  You have more than a small amount of blood in your poop.  You see large clumps of tissue (blood clots) in your poop.  Your belly is swollen.  You feel like you may vomit (nauseous).  You vomit.  You have a fever.  You have belly pain that gets worse, and medicine does not help your pain. Summary  After the procedure, it is common to have a small amount of blood in your poop. You may also have mild cramping and bloating in your belly.  If you were given a medicine to help you relax (sedative) during your procedure, it can affect you for many hours. Do not drive or use machinery until your doctor says that it is safe.  Get help right away if you have a lot of blood in your poop, feel like you may vomit, have a fever, or have more belly pain. This information is not intended to replace advice given to you by your health care provider. Make sure you discuss any questions you have with your health care provider. Document Revised: 11/06/2018 Document Reviewed: 07/27/2018 Elsevier Patient Education  2021  [employer] Hemorrhoids Hemorrhoids are swollen veins that may develop:  In the butt (rectum). These are called internal hemorrhoids.  Around the opening of the butt (anus). These are called external hemorrhoids. Hemorrhoids can cause pain, itching, or bleeding. Most of the time, they do not cause serious problems. They usually get better with diet changes, lifestyle changes, and other home treatments. What are the causes? This condition may be caused by:  Having trouble pooping (constipation).  Pushing hard (straining) to poop.  Watery poop (diarrhea).  Pregnancy.  Being very overweight (obese).  Sitting for long periods of  time.  Heavy lifting or other activity that causes you to strain.  Anal sex.  Riding a bike for a long period of time. What are the signs or symptoms? Symptoms of this condition include:  Pain.  Itching or soreness in the butt.  Bleeding from the butt.  Leaking poop.  Swelling in the area.  One or more lumps around the opening of your butt. How is this diagnosed? A doctor can often diagnose this condition by looking at the affected area. The doctor may also:  Do an exam that involves feeling the area with a gloved hand (digital rectal exam).  Examine the area inside your butt using a small tube (anoscope).  Order blood tests. This may be done if you have lost a lot of blood.  Have you get a test that involves looking inside the colon using a flexible tube with a camera on the end (sigmoidoscopy or colonoscopy). How is this treated? This condition can usually be treated at home. Your doctor may tell you to change what you eat, make lifestyle changes, or try home treatments. If these do not help, procedures can be done to remove the hemorrhoids or make them smaller. These may involve:  Placing rubber bands at the base of the hemorrhoids to cut off their blood supply.  Injecting medicine into the hemorrhoids to shrink them.  Shining a type of light energy onto the hemorrhoids to cause them to fall off.  Doing surgery to remove the hemorrhoids or cut off their blood supply. Follow these instructions at home: Eating and drinking  Eat foods that have a lot of fiber in them. These include whole grains, beans, nuts, fruits, and vegetables.  Ask your doctor about taking products that have added fiber (fibersupplements).  Reduce the amount of fat in your diet. You can do this by: ? Eating low-fat dairy products. ? Eating less red meat. ? Avoiding processed foods.  Drink enough fluid to keep your pee (urine) pale yellow.   Managing pain and swelling  Take a warm-water  bath (sitz bath) for 20 minutes to ease pain. Do this 3-4 times a day. You may do this in a bathtub or using a portable sitz bath that fits over the toilet.  If told, put ice on the painful area. It may be helpful to use ice between your warm baths. ? Put ice in a plastic bag. ? Place a towel between your skin and the bag. ? Leave the ice on for 20 minutes, 2-3 times a day.   General instructions  Take over-the-counter and prescription medicines only as told by your doctor. ? Medicated creams and medicines may be used as told.  Exercise often. Ask your doctor how much and what kind of exercise is best for you.  Go to the bathroom when you have the urge to poop. Do not wait.  Avoid pushing too hard when  you poop.  Keep your butt dry and clean. Use wet toilet paper or moist towelettes after pooping.  Do not sit on the toilet for a long time.  Keep all follow-up visits as told by your doctor. This is important. Contact a doctor if you:  Have pain and swelling that do not get better with treatment or medicine.  Have trouble pooping.  Cannot poop.  Have pain or swelling outside the area of the hemorrhoids. Get help right away if you have:  Bleeding that will not stop. Summary  Hemorrhoids are swollen veins in the butt or around the opening of the butt.  They can cause pain, itching, or bleeding.  Eat foods that have a lot of fiber in them. These include whole grains, beans, nuts, fruits, and vegetables.  Take a warm-water bath (sitz bath) for 20 minutes to ease pain. Do this 3-4 times a day. This information is not intended to replace advice given to you by your health care provider. Make sure you discuss any questions you have with your health care provider. Document Revised: 01/08/2018 Document Reviewed: 05/22/2017 Elsevier Patient Education  2021 [employer] Diverticulosis  Diverticulosis is a condition that develops when small pouches (diverticula) form in the wall  of the large intestine (colon). The colon is where water is absorbed and stool (feces) is formed. The pouches form when the inside layer of the colon pushes through weak spots in the outer layers of the colon. You may have a few pouches or many of them. The pouches usually do not cause problems unless they become inflamed or infected. When this happens, the condition is called diverticulitis. What are the causes? The cause of this condition is not known. What increases the risk? The following factors may make you more likely to develop this condition:  Being older than age 58. Your risk for this condition increases with age. Diverticulosis is rare among people younger than age 13. By age 5, many people have it.  Eating a low-fiber diet.  Having frequent constipation.  Being overweight.  Not getting enough exercise.  Smoking.  Taking over-the-counter pain medicines, like aspirin and ibuprofen.  Having a family history of diverticulosis. What are the signs or symptoms? In most people, there are no symptoms of this condition. If you do have symptoms, they may include:  Bloating.  Cramps in the abdomen.  Constipation or diarrhea.  Pain in the lower left side of the abdomen. How is this diagnosed? Because diverticulosis usually has no symptoms, it is most often diagnosed during an exam for other colon problems. The condition may be diagnosed by:  Using a flexible scope to examine the colon (colonoscopy).  Taking an X-ray of the colon after dye has been put into the colon (barium enema).  Having a CT scan. How is this treated? You may not need treatment for this condition. Your health care provider may recommend treatment to prevent problems. You may need treatment if you have symptoms or if you previously had diverticulitis. Treatment may include:  Eating a high-fiber diet.  Taking a fiber supplement.  Taking a live bacteria supplement (probiotic).  Taking medicine to  relax your colon.   Follow these instructions at home: Medicines  Take over-the-counter and prescription medicines only as told by your health care provider.  If told by your health care provider, take a fiber supplement or probiotic. Constipation prevention Your condition may cause constipation. To prevent or treat constipation, you may need to:  Drink enough  fluid to keep your urine pale yellow.  Take over-the-counter or prescription medicines.  Eat foods that are high in fiber, such as beans, whole grains, and fresh fruits and vegetables.  Limit foods that are high in fat and processed sugars, such as fried or sweet foods.   General instructions  Try not to strain when you have a bowel movement.  Keep all follow-up visits as told by your health care provider. This is important. Contact a health care provider if you:  Have pain in your abdomen.  Have bloating.  Have cramps.  Have not had a bowel movement in 3 days. Get help right away if:  Your pain gets worse.  Your bloating becomes very bad.  You have a fever or chills, and your symptoms suddenly get worse.  You vomit.  You have bowel movements that are bloody or black.  You have bleeding from your rectum. Summary  Diverticulosis is a condition that develops when small pouches (diverticula) form in the wall of the large intestine (colon).  You may have a few pouches or many of them.  This condition is most often diagnosed during an exam for other colon problems.  Treatment may include increasing the fiber in your diet, taking supplements, or taking medicines. This information is not intended to replace advice given to you by your health care provider. Make sure you discuss any questions you have with your health care provider. Document Revised: 07/30/2018 Document Reviewed: 07/30/2018 Elsevier Patient Education  2021 [employer]   You are being discharged to home.  Resume your previous diet.  Your  physician has recommended a repeat colonoscopy in five years for screening purposes.

## 2020-03-24 ENCOUNTER — Encounter (HOSPITAL_COMMUNITY): Payer: Self-pay | Admitting: Gastroenterology

## 2020-03-28 LAB — HM DIABETES EYE EXAM

## 2020-03-29 ENCOUNTER — Encounter: Payer: Self-pay | Admitting: Family Medicine

## 2020-03-29 ENCOUNTER — Ambulatory Visit (INDEPENDENT_AMBULATORY_CARE_PROVIDER_SITE_OTHER): Payer: Medicaid Other | Admitting: Family Medicine

## 2020-03-29 VITALS — BP 108/82

## 2020-03-29 DIAGNOSIS — I10 Essential (primary) hypertension: Secondary | ICD-10-CM

## 2020-03-29 MED ORDER — LISINOPRIL 20 MG PO TABS: 20.0000 mg | ORAL_TABLET | Freq: Every day | ORAL | 2 refills | Status: DC

## 2020-03-29 NOTE — Progress Notes (Signed)
Virtual Visit via Telephone Note  I connected with Oscar Cox on 03/29/20 at 1:31 PM by telephone and verified that I am speaking with the correct person using two identifiers. Oscar Cox is currently located at home and nobody is currently with him during this visit. The provider, Gwenlyn Fudge, FNP is located in their home at time of visit.  I discussed the limitations, risks, security and privacy concerns of performing an evaluation and management service by telephone and the availability of in person appointments. I also discussed with the patient that there may be a patient responsible charge related to this service. The patient expressed understanding and agreed to proceed.  Subjective: PCP: Gwenlyn Fudge, FNP  Chief Complaint  Patient presents with  . Hypertension   Patient reports his blood pressure has been running low.  He does check it at home and reports he is getting readings 90s/60s.  He denies any dizziness, but does state that he feels pretty tired all the time.  This morning his blood pressure was 108/82 without taking his medication today.   ROS: Per HPI  Current Outpatient Medications:  .  acetaminophen (TYLENOL) 500 MG tablet, Take 1,000 mg by mouth every 8 (eight) hours as needed (pain.)., Disp: , Rfl:  .  Empagliflozin-linaGLIPtin (GLYXAMBI) 25-5 MG TABS, Take 1 tablet by mouth daily., Disp: 90 tablet, Rfl: 1 .  EQ ALLERGY RELIEF, CETIRIZINE, 10 MG tablet, Take 1 tablet by mouth once daily (Patient taking differently: Take 10 mg by mouth daily.), Disp: 90 tablet, Rfl: 1 .  fluticasone (FLONASE) 50 MCG/ACT nasal spray, Use 2 spray(s) in each nostril once daily (Patient taking differently: Place 2 sprays into both nostrils daily.), Disp: 16 g, Rfl: 5 .  gabapentin (NEURONTIN) 400 MG capsule, Take 1 capsule (400 mg total) by mouth 3 (three) times daily., Disp: 270 capsule, Rfl: 1 .  glipiZIDE (GLUCOTROL XL) 5 MG 24 hr tablet, Take 1 tablet (5 mg  total) by mouth daily with breakfast., Disp: 90 tablet, Rfl: 1 .  ibuprofen (ADVIL) 600 MG tablet, Take 1 tablet (600 mg total) by mouth every 8 (eight) hours as needed., Disp: 30 tablet, Rfl: 0 .  Lidocaine-Hydrocortisone Ace 1-1 % CREA, Place 1 application rectally as needed (hemorrhoids). Preparation H with lidocaine, Disp: , Rfl:  .  lisinopril (ZESTRIL) 40 MG tablet, Take 1 tablet (40 mg total) by mouth daily., Disp: 90 tablet, Rfl: 1 .  omeprazole (PRILOSEC OTC) 20 MG tablet, Take 1 tablet (20 mg total) by mouth daily., Disp: 90 tablet, Rfl: 1 .  pravastatin (PRAVACHOL) 40 MG tablet, Take 1 tablet (40 mg total) by mouth daily., Disp: 90 tablet, Rfl: 1  Current Facility-Administered Medications:  .  methylPREDNISolone acetate (DEPO-MEDROL) injection 40 mg, 40 mg, Intramuscular, Once, Daryll Drown, NP  Allergies  Allergen Reactions  . Tamiflu [Oseltamivir Phosphate] Hives  . Metformin And Related Diarrhea  . Morphine Nausea Only  . Potassium Chloride Nausea And Vomiting   Past Medical History:  Diagnosis Date  . Acid reflux   . Diabetes mellitus without complication (HCC)   . Essential hypertension, benign 11/27/2016  . Hepatitis C virus infection cured after antiviral drug therapy 09/24/2018  . High cholesterol 11/27/2016    Observations/Objective: A&O  No respiratory distress or wheezing audible over the phone Mood, judgement, and thought processes all WNL   Assessment and Plan: 1. Essential hypertension, benign Blood pressure too low. Lisinopril decreased from 40 mg to 20 mg  once daily.  Patient is going to take half tablet of the 40 mg he has at home until he uses them up.  Advised that he continue to monitor his blood pressure and let me know if he is staying low and we will back off further. - lisinopril (ZESTRIL) 20 MG tablet; Take 1 tablet (20 mg total) by mouth daily.  Dispense: 30 tablet; Refill: 2   Follow Up Instructions: Return as scheduled.  I discussed  the assessment and treatment plan with the patient. The patient was provided an opportunity to ask questions and all were answered. The patient agreed with the plan and demonstrated an understanding of the instructions.   The patient was advised to call back or seek an in-person evaluation if the symptoms worsen or if the condition fails to improve as anticipated.  The above assessment and management plan was discussed with the patient. The patient verbalized understanding of and has agreed to the management plan. Patient is aware to call the clinic if symptoms persist or worsen. Patient is aware when to return to the clinic for a follow-up visit. Patient educated on when it is appropriate to go to the emergency department.   Time call ended: 1:36 PM  I provided 5 minutes of non-face-to-face time during this encounter.  [name], MSN, APRN, FNP-C [facility] 03/29/20

## 2020-04-25 ENCOUNTER — Ambulatory Visit: Payer: Medicaid Other | Admitting: Family Medicine

## 2020-05-05 ENCOUNTER — Other Ambulatory Visit: Payer: Self-pay

## 2020-05-05 ENCOUNTER — Ambulatory Visit (INDEPENDENT_AMBULATORY_CARE_PROVIDER_SITE_OTHER): Payer: Medicaid Other | Admitting: Family Medicine

## 2020-05-05 ENCOUNTER — Encounter: Payer: Self-pay | Admitting: Family Medicine

## 2020-05-05 VITALS — BP 106/72 | HR 96 | Temp 96.7°F | Ht 74.0 in | Wt 197.8 lb

## 2020-05-05 DIAGNOSIS — E782 Mixed hyperlipidemia: Secondary | ICD-10-CM

## 2020-05-05 DIAGNOSIS — R42 Dizziness and giddiness: Secondary | ICD-10-CM

## 2020-05-05 DIAGNOSIS — I10 Essential (primary) hypertension: Secondary | ICD-10-CM | POA: Diagnosis not present

## 2020-05-05 DIAGNOSIS — M542 Cervicalgia: Secondary | ICD-10-CM

## 2020-05-05 DIAGNOSIS — R55 Syncope and collapse: Secondary | ICD-10-CM | POA: Diagnosis not present

## 2020-05-05 DIAGNOSIS — J302 Other seasonal allergic rhinitis: Secondary | ICD-10-CM | POA: Diagnosis not present

## 2020-05-05 DIAGNOSIS — E1140 Type 2 diabetes mellitus with diabetic neuropathy, unspecified: Secondary | ICD-10-CM

## 2020-05-05 DIAGNOSIS — L84 Corns and callosities: Secondary | ICD-10-CM | POA: Diagnosis not present

## 2020-05-05 LAB — BAYER DCA HB A1C WAIVED: HB A1C (BAYER DCA - WAIVED): 5.7 % (ref <7.0)

## 2020-05-05 MED ORDER — MONTELUKAST SODIUM 10 MG PO TABS: 10.0000 mg | ORAL_TABLET | Freq: Every day | ORAL | 3 refills | Status: DC

## 2020-05-05 NOTE — Progress Notes (Signed)
Assessment & Plan:  1. Type 2 diabetes mellitus with diabetic neuropathy, without long-term current use of insulin (HCC) Lab Results  Component Value Date   HGBA1C 5.7 05/05/2020   HGBA1C 7.9 (H) 01/26/2020   HGBA1C 7.3 (H) 10/12/2019    - Diabetes is at goal of A1c < 7. - Medications: continue current medications - Home glucose monitoring: Continue monitoring - Patient is currently taking a statin. Patient is taking an ACE-inhibitor/ARB.  - Instruction/counseling given: reminded to get eye exam, discussed foot care, discussed diet and provided printed educational material  Diabetes Health Maintenance Due  Topic Date Due  . OPHTHALMOLOGY EXAM  01/23/2019  . HEMOGLOBIN A1C  11/04/2020  . FOOT EXAM  05/05/2021    - Lipid panel - CBC with Differential/Platelet - CMP14+EGFR - Bayer DCA Hb A1c Waived - Ambulatory referral to Podiatry  2. Foot callus - Ambulatory referral to Podiatry  3. Essential hypertension, benign Well controlled on current regimen.  - CBC with Differential/Platelet - CMP14+EGFR  4. Mixed hyperlipidemia Labs to assess. - Lipid panel  5. Seasonal allergies Uncontrolled.  Rx'd Singulair 10 mg at bedtime.  Continue daily antihistamine and Flonase.  - montelukast (SINGULAIR) 10 MG tablet; Take 1 tablet (10 mg total) by mouth at bedtime.  Dispense: 30 tablet; Refill: 3  6. Neck pain - MR Cervical Spine Wo Contrast; Future  7. Near syncope - MR Cervical Spine Wo Contrast; Future  8. Dizziness - MR Cervical Spine Wo Contrast; Future   Return in about 3 months (around 08/04/2020) for follow-up of chronic medication conditions.  [name], MSN, APRN, FNP-C [facility]  Subjective:    Patient ID: Oscar Cox, male    DOB: 01/17/1975, 45 y.o.   MRN: [id number]  Patient Care Team: Gwenlyn Fudge, FNP as PCP - General (Family Medicine)   Chief Complaint:  Chief Complaint  Patient presents with  . Diabetes  .  Hypertension    3 month follow up of chronic medical conditions   . Foot Pain    Patient states that he has been having on going bilateral feet pain that is worse on right foot.     HPI: Oscar Cox is a 45 y.o. male presenting on 05/05/2020 for Diabetes, Hypertension (3 month follow up of chronic medical conditions ), and Foot Pain (Patient states that he has been having on going bilateral feet pain that is worse on right foot. )  Diabetes: Patient presents for follow up of diabetes. Current symptoms include: hyperglycemia and paresthesia of the feet. Known diabetic complications: peripheral neuropathy. Medication compliance: yes. Current diet: in general, an "unhealthy" diet. Current exercise: none. Home blood sugar records: BGs are running  consistent with Hgb A1C. Is he  on ACE inhibitor or angiotensin II receptor blocker? Yes. Is he on a statin? Yes.   Hypertension: Approximately 6 weeks ago patient's lisinopril was decreased from 40 mg to 20 mg once daily due to episodes of symptomatic hypotension.  Hyperlipidemia: Patient reports he is taking his pravastatin consistently, whereas he was forgetting a lot before.  Neck pain: Chronic neck pain that he describes as burning.  Today he reports that when the pain comes he also feels dizzy and states that he would pass out if he did not go sit down.  X-ray of the cervical spine was negative.  New complaints: Patient reports he used to be on Singulair.  He states his allergies are not controlled with daily antihistamine and Flonase.  He is hoping he can get a new prescription for Singulair.  Social history:  Relevant past medical, surgical, family and social history reviewed and updated as indicated. Interim medical history since our last visit reviewed.  Allergies and medications reviewed and updated.  DATA REVIEWED: CHART IN EPIC  ROS: Negative unless specifically indicated above in HPI.    Current Outpatient Medications:  .   acetaminophen (TYLENOL) 500 MG tablet, Take 1,000 mg by mouth every 8 (eight) hours as needed (pain.)., Disp: , Rfl:  .  Empagliflozin-linaGLIPtin (GLYXAMBI) 25-5 MG TABS, Take 1 tablet by mouth daily., Disp: 90 tablet, Rfl: 1 .  EQ ALLERGY RELIEF, CETIRIZINE, 10 MG tablet, Take 1 tablet by mouth once daily (Patient taking differently: Take 10 mg by mouth daily.), Disp: 90 tablet, Rfl: 1 .  fluticasone (FLONASE) 50 MCG/ACT nasal spray, Use 2 spray(s) in each nostril once daily (Patient taking differently: Place 2 sprays into both nostrils daily.), Disp: 16 g, Rfl: 5 .  gabapentin (NEURONTIN) 400 MG capsule, Take 1 capsule (400 mg total) by mouth 3 (three) times daily., Disp: 270 capsule, Rfl: 1 .  glipiZIDE (GLUCOTROL XL) 5 MG 24 hr tablet, Take 1 tablet (5 mg total) by mouth daily with breakfast., Disp: 90 tablet, Rfl: 1 .  ibuprofen (ADVIL) 600 MG tablet, Take 1 tablet (600 mg total) by mouth every 8 (eight) hours as needed., Disp: 30 tablet, Rfl: 0 .  Lidocaine-Hydrocortisone Ace 1-1 % CREA, Place 1 application rectally as needed (hemorrhoids). Preparation H with lidocaine, Disp: , Rfl:  .  lisinopril (ZESTRIL) 20 MG tablet, Take 1 tablet (20 mg total) by mouth daily., Disp: 30 tablet, Rfl: 2 .  omeprazole (PRILOSEC OTC) 20 MG tablet, Take 1 tablet (20 mg total) by mouth daily., Disp: 90 tablet, Rfl: 1 .  pravastatin (PRAVACHOL) 40 MG tablet, Take 1 tablet (40 mg total) by mouth daily., Disp: 90 tablet, Rfl: 1  Current Facility-Administered Medications:  .  methylPREDNISolone acetate (DEPO-MEDROL) injection 40 mg, 40 mg, Intramuscular, Once, Ivy Lynn, NP   Allergies  Allergen Reactions  . Tamiflu [Oseltamivir Phosphate] Hives  . Metformin And Related Diarrhea  . Morphine Nausea Only  . Potassium Chloride Nausea And Vomiting   Past Medical History:  Diagnosis Date  . Acid reflux   . Diabetes mellitus without complication ([place])   . Essential hypertension, benign 11/27/2016  .  Hepatitis C virus infection cured after antiviral drug therapy 09/24/2018  . High cholesterol 11/27/2016    Past Surgical History:  Procedure Laterality Date  . COLONOSCOPY WITH PROPOFOL N/A 03/21/2020   Procedure: COLONOSCOPY WITH PROPOFOL;  Surgeon: Harvel Quale, MD;  Location: [facility];  Service: Gastroenterology;  Laterality: N/A;  am  . NO PAST SURGERIES      Social History   Socioeconomic History  . Marital status: Single    Spouse name: Not on file  . Number of children: Not on file  . Years of education: Not on file  . Highest education level: Not on file  Occupational History  . Not on file  Tobacco Use  . Smoking status: Current Every Day Smoker    Packs/day: 1.00  . Smokeless tobacco: Never Used  Vaping Use  . Vaping Use: Never used  Substance and Sexual Activity  . Alcohol use: Yes    Comment: rarely  . Drug use: Yes    Frequency: 3.0 times per week    Types: Marijuana    Comment: 03/19/20 last used  .  Sexual activity: Not on file  Other Topics Concern  . Not on file  Social History Narrative  . Not on file   Social Determinants of Health   Financial Resource Strain: Not on file  Food Insecurity: Not on file  Transportation Needs: Not on file  Physical Activity: Not on file  Stress: Not on file  Social Connections: Not on file  Intimate Partner Violence: Not on file        Objective:    BP 106/72   Pulse 96   Temp (!) 96.7 F (35.9 C) (Temporal)   Ht 6\' 2"  (1.88 m)   Wt 197 lb 12.8 oz (89.7 kg)   SpO2 95%   BMI 25.40 kg/m   Wt Readings from Last 3 Encounters:  05/05/20 197 lb 12.8 oz (89.7 kg)  03/21/20 200 lb (90.7 kg)  03/13/20 200 lb 12.8 oz (91.1 kg)   Physical Exam Vitals reviewed.  Constitutional:      General: He is not in acute distress.    Appearance: Normal appearance. He is normal weight. He is not ill-appearing, toxic-appearing or diaphoretic.  HENT:     Head: Normocephalic and atraumatic.  Eyes:      General: No scleral icterus.       Right eye: No discharge.        Left eye: No discharge.     Conjunctiva/sclera: Conjunctivae normal.  Cardiovascular:     Rate and Rhythm: Normal rate and regular rhythm.     Heart sounds: Normal heart sounds. No murmur heard. No friction rub. No gallop.   Pulmonary:     Effort: Pulmonary effort is normal. No respiratory distress.     Breath sounds: Normal breath sounds. No stridor. No wheezing, rhonchi or rales.  Musculoskeletal:        General: Normal range of motion.     Cervical back: Normal range of motion. No swelling, edema, deformity, erythema, signs of trauma, lacerations, rigidity, spasms, torticollis, tenderness, bony tenderness or crepitus. No pain with movement. Normal range of motion.  Skin:    General: Skin is warm and dry.     Comments: Very small skin tag to left axilla.  Neurological:     Mental Status: He is alert and oriented to person, place, and time. Mental status is at baseline.  Psychiatric:        Mood and Affect: Mood normal.        Behavior: Behavior normal.        Thought Content: Thought content normal.        Judgment: Judgment normal.     Lab Results  Component Value Date   TSH 0.76 03/06/2020   Lab Results  Component Value Date   WBC 11.3 (H) 01/26/2020   HGB 16.1 01/26/2020   HCT 47.8 01/26/2020   MCV 89 01/26/2020   PLT 269 01/26/2020   Lab Results  Component Value Date   NA 139 01/26/2020   K 4.4 01/26/2020   CO2 24 01/26/2020   GLUCOSE 196 (H) 01/26/2020   BUN 12 01/26/2020   CREATININE 1.12 01/26/2020   BILITOT 0.3 01/26/2020   ALKPHOS 72 01/26/2020   AST 17 01/26/2020   ALT 27 01/26/2020   PROT 7.3 01/26/2020   ALBUMIN 4.3 01/26/2020   CALCIUM 9.9 01/26/2020   ANIONGAP 10 09/30/2014   Lab Results  Component Value Date   CHOL 206 (H) 01/26/2020   Lab Results  Component Value Date   HDL 38 (L) 01/26/2020  Lab Results  Component Value Date   LDLCALC 142 (H) 01/26/2020   Lab  Results  Component Value Date   TRIG 144 01/26/2020   Lab Results  Component Value Date   CHOLHDL 5.4 (H) 01/26/2020   Lab Results  Component Value Date   HGBA1C 7.9 (H) 01/26/2020

## 2020-05-05 NOTE — Progress Notes (Deleted)
Assessment & Plan:  ***  No follow-ups on file.  [name], MSN, APRN, FNP-C [facility]  Subjective:    Patient ID: Oscar Cox, male    DOB: [birth year], 45 y.o.   MRN: [id number]  Patient Care Team: Gwenlyn Fudge, FNP as PCP - General (Family Medicine) Ihor Austin Ruby Cola, NP as Registered Nurse (Adult Health Nurse Practitioner)   Chief Complaint:  Chief Complaint  Patient presents with  . Diabetes  . Hypertension    3 month follow up of chronic medical conditions   . Foot Pain    Patient states that he has been having on going bilateral feet pain that is worse on right foot.     HPI: Oscar Cox is a 45 y.o. male presenting on 05/05/2020 for Diabetes, Hypertension (3 month follow up of chronic medical conditions ), and Foot Pain (Patient states that he has been having on going bilateral feet pain that is worse on right foot. )    New complaints: ***  Social history:  Relevant past medical, surgical, family and social history reviewed and updated as indicated. Interim medical history since our last visit reviewed.  Allergies and medications reviewed and updated.  DATA REVIEWED: CHART IN EPIC  ROS: Negative unless specifically indicated above in HPI.    Current Outpatient Medications:  .  acetaminophen (TYLENOL) 500 MG tablet, Take 1,000 mg by mouth every 8 (eight) hours as needed (pain.)., Disp: , Rfl:  .  Empagliflozin-linaGLIPtin (GLYXAMBI) 25-5 MG TABS, Take 1 tablet by mouth daily., Disp: 90 tablet, Rfl: 1 .  EQ ALLERGY RELIEF, CETIRIZINE, 10 MG tablet, Take 1 tablet by mouth once daily (Patient taking differently: Take 10 mg by mouth daily.), Disp: 90 tablet, Rfl: 1 .  fluticasone (FLONASE) 50 MCG/ACT nasal spray, Use 2 spray(s) in each nostril once daily (Patient taking differently: Place 2 sprays into both nostrils daily.), Disp: 16 g, Rfl: 5 .  gabapentin (NEURONTIN) 400 MG capsule, Take 1 capsule (400 mg total)  by mouth 3 (three) times daily., Disp: 270 capsule, Rfl: 1 .  glipiZIDE (GLUCOTROL XL) 5 MG 24 hr tablet, Take 1 tablet (5 mg total) by mouth daily with breakfast., Disp: 90 tablet, Rfl: 1 .  ibuprofen (ADVIL) 600 MG tablet, Take 1 tablet (600 mg total) by mouth every 8 (eight) hours as needed., Disp: 30 tablet, Rfl: 0 .  Lidocaine-Hydrocortisone Ace 1-1 % CREA, Place 1 application rectally as needed (hemorrhoids). Preparation H with lidocaine, Disp: , Rfl:  .  lisinopril (ZESTRIL) 20 MG tablet, Take 1 tablet (20 mg total) by mouth daily., Disp: 30 tablet, Rfl: 2 .  omeprazole (PRILOSEC OTC) 20 MG tablet, Take 1 tablet (20 mg total) by mouth daily., Disp: 90 tablet, Rfl: 1 .  pravastatin (PRAVACHOL) 40 MG tablet, Take 1 tablet (40 mg total) by mouth daily., Disp: 90 tablet, Rfl: 1  Current Facility-Administered Medications:  .  methylPREDNISolone acetate (DEPO-MEDROL) injection 40 mg, 40 mg, Intramuscular, Once, Daryll Drown, NP   Allergies  Allergen Reactions  . Tamiflu [Oseltamivir Phosphate] Hives  . Metformin And Related Diarrhea  . Morphine Nausea Only  . Potassium Chloride Nausea And Vomiting   Past Medical History:  Diagnosis Date  . Acid reflux   . Diabetes mellitus without complication (HCC)   . Essential hypertension, benign 11/27/2016  . Hepatitis C virus infection cured after antiviral drug therapy 09/24/2018  . High cholesterol 11/27/2016    Past Surgical History:  Procedure  Laterality Date  . COLONOSCOPY WITH PROPOFOL N/A 03/21/2020   Procedure: COLONOSCOPY WITH PROPOFOL;  Surgeon: Dolores Frame, MD;  Location: [facility];  Service: Gastroenterology;  Laterality: N/A;  am  . NO PAST SURGERIES      Social History   Socioeconomic History  . Marital status: Single    Spouse name: Not on file  . Number of children: Not on file  . Years of education: Not on file  . Highest education level: Not on file  Occupational History  . Not on file  Tobacco Use   . Smoking status: Current Every Day Smoker    Packs/day: 1.00  . Smokeless tobacco: Never Used  Vaping Use  . Vaping Use: Never used  Substance and Sexual Activity  . Alcohol use: Yes    Comment: rarely  . Drug use: Yes    Frequency: 3.0 times per week    Types: Marijuana    Comment: 03/19/20 last used  . Sexual activity: Not on file  Other Topics Concern  . Not on file  Social History Narrative  . Not on file   Social Determinants of Health   Financial Resource Strain: Not on file  Food Insecurity: Not on file  Transportation Needs: Not on file  Physical Activity: Not on file  Stress: Not on file  Social Connections: Not on file  Intimate Partner Violence: Not on file        Objective:    BP 106/72   Pulse 96   Temp (!) 96.7 F (35.9 C) (Temporal)   Ht 6\' 2"  (1.88 m)   Wt 197 lb 12.8 oz (89.7 kg)   SpO2 95%   BMI 25.40 kg/m   Wt Readings from Last 3 Encounters:  05/05/20 197 lb 12.8 oz (89.7 kg)  03/21/20 200 lb (90.7 kg)  03/13/20 200 lb 12.8 oz (91.1 kg)    Physical Exam  Lab Results  Component Value Date   TSH 0.76 03/06/2020   Lab Results  Component Value Date   WBC 11.3 (H) 01/26/2020   HGB 16.1 01/26/2020   HCT 47.8 01/26/2020   MCV 89 01/26/2020   PLT 269 01/26/2020   Lab Results  Component Value Date   NA 139 01/26/2020   K 4.4 01/26/2020   CO2 24 01/26/2020   GLUCOSE 196 (H) 01/26/2020   BUN 12 01/26/2020   CREATININE 1.12 01/26/2020   BILITOT 0.3 01/26/2020   ALKPHOS 72 01/26/2020   AST 17 01/26/2020   ALT 27 01/26/2020   PROT 7.3 01/26/2020   ALBUMIN 4.3 01/26/2020   CALCIUM 9.9 01/26/2020   ANIONGAP 10 09/30/2014   Lab Results  Component Value Date   CHOL 206 (H) 01/26/2020   Lab Results  Component Value Date   HDL 38 (L) 01/26/2020   Lab Results  Component Value Date   LDLCALC 142 (H) 01/26/2020   Lab Results  Component Value Date   TRIG 144 01/26/2020   Lab Results  Component Value Date   CHOLHDL 5.4 (H)  01/26/2020   Lab Results  Component Value Date   HGBA1C 7.9 (H) 01/26/2020

## 2020-05-05 NOTE — Patient Instructions (Signed)
Diabetes Mellitus and Nutrition, Adult When you have diabetes, or diabetes mellitus, it is very important to have healthy eating habits because your blood sugar (glucose) levels are greatly affected by what you eat and drink. Eating healthy foods in the right amounts, at about the same times every day, can help you:  Control your blood glucose.  Lower your risk of heart disease.  Improve your blood pressure.  Reach or maintain a healthy weight. What can affect my meal plan? Every person with diabetes is different, and each person has different needs for a meal plan. Your health care provider may recommend that you work with a dietitian to make a meal plan that is best for you. Your meal plan may vary depending on factors such as:  The calories you need.  The medicines you take.  Your weight.  Your blood glucose, blood pressure, and cholesterol levels.  Your activity level.  Other health conditions you have, such as heart or kidney disease. How do carbohydrates affect me? Carbohydrates, also called carbs, affect your blood glucose level more than any other type of food. Eating carbs naturally raises the amount of glucose in your blood. Carb counting is a method for keeping track of how many carbs you eat. Counting carbs is important to keep your blood glucose at a healthy level, especially if you use insulin or take certain oral diabetes medicines. It is important to know how many carbs you can safely have in each meal. This is different for every person. Your dietitian can help you calculate how many carbs you should have at each meal and for each snack. How does alcohol affect me? Alcohol can cause a sudden decrease in blood glucose (hypoglycemia), especially if you use insulin or take certain oral diabetes medicines. Hypoglycemia can be a life-threatening condition. Symptoms of hypoglycemia, such as sleepiness, dizziness, and confusion, are similar to symptoms of having too much  alcohol.  Do not drink alcohol if: ? Your health care provider tells you not to drink. ? You are pregnant, may be pregnant, or are planning to become pregnant.  If you drink alcohol: ? Do not drink on an empty stomach. ? Limit how much you use to:  0-1 drink a day for women.  0-2 drinks a day for men. ? Be aware of how much alcohol is in your drink. In the [place] one drink equals one 12 oz bottle of beer (355 mL), one 5 oz glass of wine (148 mL), or one 1 oz glass of hard liquor (44 mL). ? Keep yourself hydrated with water, diet soda, or unsweetened iced tea.  Keep in mind that regular soda, juice, and other mixers may contain a lot of sugar and must be counted as carbs. What are tips for following this plan? Reading food labels  Start by checking the serving size on the "Nutrition Facts" label of packaged foods and drinks. The amount of calories, carbs, fats, and other nutrients listed on the label is based on one serving of the item. Many items contain more than one serving per package.  Check the total grams (g) of carbs in one serving. You can calculate the number of servings of carbs in one serving by dividing the total carbs by 15. For example, if a food has 30 g of total carbs per serving, it would be equal to 2 servings of carbs.  Check the number of grams (g) of saturated fats and trans fats in one serving. Choose foods that have   a low amount or none of these fats.  Check the number of milligrams (mg) of salt (sodium) in one serving. Most people should limit total sodium intake to less than 2,300 mg per day.  Always check the nutrition information of foods labeled as "low-fat" or "nonfat." These foods may be higher in added sugar or refined carbs and should be avoided.  Talk to your dietitian to identify your daily goals for nutrients listed on the label. Shopping  Avoid buying canned, pre-made, or processed foods. These foods tend to be high in fat, sodium, and added  sugar.  Shop around the outside edge of the grocery store. This is where you will most often find fresh fruits and vegetables, bulk grains, fresh meats, and fresh dairy. Cooking  Use low-heat cooking methods, such as baking, instead of high-heat cooking methods like deep frying.  Cook using healthy oils, such as olive, canola, or sunflower oil.  Avoid cooking with butter, cream, or high-fat meats. Meal planning  Eat meals and snacks regularly, preferably at the same times every day. Avoid going long periods of time without eating.  Eat foods that are high in fiber, such as fresh fruits, vegetables, beans, and whole grains. Talk with your dietitian about how many servings of carbs you can eat at each meal.  Eat 4-6 oz (112-168 g) of lean protein each day, such as lean meat, chicken, fish, eggs, or tofu. One ounce (oz) of lean protein is equal to: ? 1 oz (28 g) of meat, chicken, or fish. ? 1 egg. ?  cup (62 g) of tofu.  Eat some foods each day that contain healthy fats, such as avocado, nuts, seeds, and fish.   What foods should I eat? Fruits Berries. Apples. Oranges. Peaches. Apricots. Plums. Grapes. Mango. Papaya. Pomegranate. Kiwi. Cherries. Vegetables Lettuce. Spinach. Leafy greens, including kale, chard, collard greens, and mustard greens. Beets. Cauliflower. Cabbage. Broccoli. Carrots. Green beans. Tomatoes. Peppers. Onions. Cucumbers. Brussels sprouts. Grains Whole grains, such as whole-wheat or whole-grain bread, crackers, tortillas, cereal, and pasta. Unsweetened oatmeal. Quinoa. Brown or wild rice. Meats and other proteins Seafood. Poultry without skin. Lean cuts of poultry and beef. Tofu. Nuts. Seeds. Dairy Low-fat or fat-free dairy products such as milk, yogurt, and cheese. The items listed above may not be a complete list of foods and beverages you can eat. Contact a dietitian for more information. What foods should I avoid? Fruits Fruits canned with  syrup. Vegetables Canned vegetables. Frozen vegetables with butter or cream sauce. Grains Refined white flour and flour products such as bread, pasta, snack foods, and cereals. Avoid all processed foods. Meats and other proteins Fatty cuts of meat. Poultry with skin. Breaded or fried meats. Processed meat. Avoid saturated fats. Dairy Full-fat yogurt, cheese, or milk. Beverages Sweetened drinks, such as soda or iced tea. The items listed above may not be a complete list of foods and beverages you should avoid. Contact a dietitian for more information. Questions to ask a health care provider  Do I need to meet with a diabetes educator?  Do I need to meet with a dietitian?  What number can I call if I have questions?  When are the best times to check my blood glucose? Where to find more information:  American Diabetes Association: diabetes.org  Academy of Nutrition and Dietetics: www.eatright.org  National Institute of Diabetes and Digestive and Kidney Diseases: www.niddk.nih.gov  Association of Diabetes Care and Education Specialists: www.diabeteseducator.org Summary  It is important to have healthy eating   habits because your blood sugar (glucose) levels are greatly affected by what you eat and drink.  A healthy meal plan will help you control your blood glucose and maintain a healthy lifestyle.  Your health care provider may recommend that you work with a dietitian to make a meal plan that is best for you.  Keep in mind that carbohydrates (carbs) and alcohol have immediate effects on your blood glucose levels. It is important to count carbs and to use alcohol carefully. This information is not intended to replace advice given to you by your health care provider. Make sure you discuss any questions you have with your health care provider. Document Revised: 12/08/2018 Document Reviewed: 12/08/2018 Elsevier Patient Education  2021 Elsevier Inc.  

## 2020-05-06 LAB — CMP14+EGFR
ALT: 31 IU/L (ref 0–44)
AST: 23 IU/L (ref 0–40)
Albumin/Globulin Ratio: 1.7 (ref 1.2–2.2)
Albumin: 4.4 g/dL (ref 4.0–5.0)
Alkaline Phosphatase: 68 IU/L (ref 44–121)
BUN/Creatinine Ratio: 21 — ABNORMAL HIGH (ref 9–20)
BUN: 21 mg/dL (ref 6–24)
Bilirubin Total: 0.3 mg/dL (ref 0.0–1.2)
CO2: 23 mmol/L (ref 20–29)
Calcium: 9.7 mg/dL (ref 8.7–10.2)
Chloride: 100 mmol/L (ref 96–106)
Creatinine, Ser: 1 mg/dL (ref 0.76–1.27)
Globulin, Total: 2.6 g/dL (ref 1.5–4.5)
Glucose: 123 mg/dL — ABNORMAL HIGH (ref 65–99)
Potassium: 4.5 mmol/L (ref 3.5–5.2)
Sodium: 139 mmol/L (ref 134–144)
Total Protein: 7 g/dL (ref 6.0–8.5)
eGFR: 95 mL/min/{1.73_m2} (ref >59)

## 2020-05-06 LAB — CBC WITH DIFFERENTIAL/PLATELET
Basophils Absolute: 0.1 10*3/uL (ref 0.0–0.2)
Basos: 1 %
EOS (ABSOLUTE): 0.3 10*3/uL (ref 0.0–0.4)
Eos: 3 %
Hematocrit: 44.8 % (ref 37.5–51.0)
Hemoglobin: 15.2 g/dL (ref 13.0–17.7)
Immature Grans (Abs): 0 10*3/uL (ref 0.0–0.1)
Immature Granulocytes: 0 %
Lymphocytes Absolute: 3.5 10*3/uL — ABNORMAL HIGH (ref 0.7–3.1)
Lymphs: 32 %
MCH: 30.8 pg (ref 26.6–33.0)
MCHC: 33.9 g/dL (ref 31.5–35.7)
MCV: 91 fL (ref 79–97)
Monocytes Absolute: 0.8 10*3/uL (ref 0.1–0.9)
Monocytes: 7 %
Neutrophils Absolute: 6.1 10*3/uL (ref 1.4–7.0)
Neutrophils: 57 %
Platelets: 239 10*3/uL (ref 150–450)
RBC: 4.94 x10E6/uL (ref 4.14–5.80)
RDW: 12.2 % (ref 11.6–15.4)
WBC: 10.8 10*3/uL (ref 3.4–10.8)

## 2020-05-06 LAB — LIPID PANEL
Chol/HDL Ratio: 3.3 ratio (ref 0.0–5.0)
Cholesterol, Total: 125 mg/dL (ref 100–199)
HDL: 38 mg/dL — ABNORMAL LOW (ref >39)
LDL Chol Calc (NIH): 68 mg/dL (ref 0–99)
Triglycerides: 102 mg/dL (ref 0–149)
VLDL Cholesterol Cal: 19 mg/dL (ref 5–40)

## 2020-05-08 ENCOUNTER — Encounter: Payer: Self-pay | Admitting: Family Medicine

## 2020-05-08 DIAGNOSIS — J302 Other seasonal allergic rhinitis: Secondary | ICD-10-CM | POA: Insufficient documentation

## 2020-05-08 DIAGNOSIS — M542 Cervicalgia: Secondary | ICD-10-CM | POA: Insufficient documentation

## 2020-05-15 ENCOUNTER — Other Ambulatory Visit: Payer: Self-pay | Admitting: Family Medicine

## 2020-05-15 DIAGNOSIS — J302 Other seasonal allergic rhinitis: Secondary | ICD-10-CM

## 2020-05-18 ENCOUNTER — Telehealth: Payer: Self-pay | Admitting: Family Medicine

## 2020-05-18 DIAGNOSIS — M542 Cervicalgia: Secondary | ICD-10-CM

## 2020-05-18 NOTE — Telephone Encounter (Signed)
Patient is aware and he would like to be referred for physical therapy next door.

## 2020-05-18 NOTE — Telephone Encounter (Signed)
Please let patient know his insurance denied the MRI since he has not had physical therapy recently.  Would he like to be referred for this?

## 2020-05-18 NOTE — Addendum Note (Signed)
Addended by: [name] F on: 05/18/2020 12:16 PM   Modules accepted: Orders

## 2020-05-18 NOTE — Telephone Encounter (Signed)
Referral placed.

## 2020-05-29 ENCOUNTER — Other Ambulatory Visit: Payer: Self-pay

## 2020-05-29 ENCOUNTER — Ambulatory Visit: Payer: Medicaid Other | Admitting: Podiatry

## 2020-05-29 ENCOUNTER — Encounter: Payer: Self-pay | Admitting: Podiatry

## 2020-05-29 DIAGNOSIS — E1140 Type 2 diabetes mellitus with diabetic neuropathy, unspecified: Secondary | ICD-10-CM | POA: Diagnosis not present

## 2020-05-29 DIAGNOSIS — L84 Corns and callosities: Secondary | ICD-10-CM

## 2020-05-29 DIAGNOSIS — E1149 Type 2 diabetes mellitus with other diabetic neurological complication: Secondary | ICD-10-CM

## 2020-05-29 NOTE — Progress Notes (Signed)
This patient present to the office  with chief complaint of callus developing under the outside of ball of both feet.  He says these calluses have become painful walking and wearing her shoes. Patient has provided no  treatment or sought professional help.  He presents to the office for treatment of her painful callus.  Vascular  Dorsalis pedis and posterior tibial pulses are palpable  B/L.  Capillary return  WNL.  Temperature gradient is  WNL.  Skin turgor  WNL  Sensorium  Senn Weinstein monofilament wire  WNL. Normal tactile sensation.  Nail Exam  Patient has normal nails with no evidence of bacterial or fungal infection.  Orthopedic  Exam  Muscle tone and muscle strength  WNL.  No limitations of motion feet  B/L.  No crepitus or joint effusion noted.  Foot type is unremarkable and digits show no abnormalities.  Bony prominences are unremarkable.  Plantar flexed fifth metatarsal  B/L.  Hallux limitus 1st MPJ right foot.  Skin  No open lesions.  Normal skin texture and turgor.  Callus/porokeratosis  sub 5th  B/L  Pinch callus right hallux.  Porokeratosis secondary plantar flexed fifth metatarsal  B/L  IE  Debride callus/porokeratosis.  Discussed condition with patient.  Helane Gunther DPM

## 2020-05-30 ENCOUNTER — Encounter: Payer: Self-pay | Admitting: Physical Therapy

## 2020-05-30 ENCOUNTER — Other Ambulatory Visit: Payer: Self-pay

## 2020-05-30 ENCOUNTER — Ambulatory Visit: Payer: Medicaid Other | Attending: Family Medicine | Admitting: Physical Therapy

## 2020-05-30 DIAGNOSIS — M542 Cervicalgia: Secondary | ICD-10-CM | POA: Insufficient documentation

## 2020-05-30 DIAGNOSIS — R293 Abnormal posture: Secondary | ICD-10-CM | POA: Diagnosis not present

## 2020-05-30 NOTE — Therapy (Signed)
[facility] [address] [place], [place], [address] Phone: [phone]   Fax:  [phone]  Physical Therapy Evaluation  Patient Details  Name: Oscar Cox MRN: [id number] Date of Birth: [birth year] Referring Provider (PT): [name]   Encounter Date: 05/30/2020   PT End of Session - 05/30/20 1046    Visit Number 1    Number of Visits 12    Date for PT Re-Evaluation 07/11/20    PT Start Time 0901    PT Stop Time 0927    PT Time Calculation (min) 26 min           Past Medical History:  Diagnosis Date  . Acid reflux   . Diabetes mellitus without complication (HCC)   . Essential hypertension, benign 11/27/2016  . Hepatitis C virus infection cured after antiviral drug therapy 09/24/2018  . High cholesterol 11/27/2016    Past Surgical History:  Procedure Laterality Date  . COLONOSCOPY WITH PROPOFOL N/A 03/21/2020   Procedure: COLONOSCOPY WITH PROPOFOL;  Surgeon: Dolores Frame, MD;  Location: [facility];  Service: Gastroenterology;  Laterality: N/A;  am  . NO PAST SURGERIES      There were no vitals filed for this visit.    Subjective Assessment - 05/30/20 1055    Subjective COVID-19 screen performed prior to patient entering clinic.  The patient presents to the clinic today with c/o of neck that for a "few years now."  He states that the longer he stands the worse it gets.  In fact, she states he has to lie down as he has passed out a couple of times.  He has not found anything really helps to decrease his pain.    Pertinent History H/o hep C, DM.    How long can you stand comfortably? Varies.    Diagnostic tests X-ray.    Patient Stated Goals Get out of pain and quit passing out.    Currently in Pain? Yes    Pain Score 9     Pain Location Neck    Pain Orientation Mid    Pain Descriptors / Indicators Burning;Sharp    Pain Type Chronic pain    Pain Onset More than a month ago    Pain Frequency  Constant    Aggravating Factors  See above.    Pain Relieving Factors See above.              [facility] PT Assessment - 05/30/20 0001      Assessment   Medical Diagnosis Neck pain.    Referring Provider (PT) [name]    Onset Date/Surgical Date --   3 years+.     Precautions   Precaution Comments H/o passing out.      Restrictions   Weight Bearing Restrictions No      Balance Screen   Has the patient fallen in the past 6 months No    Has the patient had a decrease in activity level because of a fear of falling?  No    Is the patient reluctant to leave their home because of a fear of falling?  No      Prior Function   Level of Independence Independent      Posture/Postural Control   Posture/Postural Control Postural limitations    Postural Limitations Rounded Shoulders;Forward head      Deep Tendon Reflexes   DTR Assessment Site Biceps;Brachioradialis;Triceps    Biceps DTR 2+    Brachioradialis DTR 2+  Triceps DTR 0      ROM / Strength   AROM / PROM / Strength AROM;Strength      AROM   Overall AROM Comments Bilateral active cervical rotation to 75 degrees and SBing is full.      Strength   Overall Strength Comments Normal bilateral UE strength.      Palpation   Palpation comment Tender to palpation over and around his C7 spinous process.      Ambulation/Gait   Gait Comments WNL.                      Objective measurements completed on examination: See above findings.                    PT Long Term Goals - 05/30/20 1107      PT LONG TERM GOAL #1   Title Independent with a HEP.    Baseline No knowledge of appropriate ther ex.    Time 6    Period Weeks    Status New      PT LONG TERM GOAL #2   Title Stand unlimited with pain not > a 3/10.    Baseline Standing produces severe neck pain.    Time 6    Period Weeks    Status New      PT LONG TERM GOAL #3   Title No episodes of passing out.    Baseline He has passed  out a couple of times.    Time 6    Period Weeks    Status New                  Plan - 05/30/20 1105    Comorbidities H/o hep C, DM.    Examination-Activity Limitations Other;Stand    Examination-Participation Restrictions Other    Stability/Clinical Decision Making Evolving/Moderate complexity    Clinical Decision Making Low    Rehab Potential Good    PT Frequency 2x / week    PT Duration 6 weeks    PT Treatment/Interventions ADLs/Self Care Home Management;Therapeutic exercise;Therapeutic activities;Manual techniques    PT Next Visit Plan Postural exercises, STW/M.    Consulted and Agree with Plan of Care Patient           Patient will benefit from skilled therapeutic intervention in order to improve the following deficits and impairments:  Pain,Decreased activity tolerance,Postural dysfunction  Visit Diagnosis: Cervicalgia - Plan: PT plan of care cert/re-cert  Abnormal posture - Plan: PT plan of care cert/re-cert     Problem List Patient Active Problem List   Diagnosis Date Noted  . Callus of foot 05/29/2020  . Seasonal allergies 05/08/2020  . Neck pain 05/08/2020  . Constipation 03/06/2020  . Family history of colon cancer 03/06/2020  . Mixed hyperlipidemia 01/29/2020  . Essential hypertension, benign 11/27/2016  . Diabetic neuropathy with neurologic complication (HCC) 10/29/2016  . Type 2 diabetes mellitus with diabetic neuropathy, without long-term current use of insulin (HCC) 10/29/2016  . Gastroesophageal reflux disease without esophagitis 10/29/2016    Lanisa Ishler, [place] MPT 05/30/2020, 11:10 AM  [facility] [address] [place], [place], [address] Phone: [phone]   Fax:  [phone]  Name: Oscar Cox MRN: [id number] Date of Birth: Aug 09, 1975

## 2020-05-31 ENCOUNTER — Other Ambulatory Visit: Payer: Self-pay | Admitting: Family Medicine

## 2020-05-31 DIAGNOSIS — J302 Other seasonal allergic rhinitis: Secondary | ICD-10-CM

## 2020-06-02 ENCOUNTER — Ambulatory Visit (INDEPENDENT_AMBULATORY_CARE_PROVIDER_SITE_OTHER): Payer: Medicaid Other

## 2020-06-02 ENCOUNTER — Other Ambulatory Visit: Payer: Self-pay

## 2020-06-02 DIAGNOSIS — Z23 Encounter for immunization: Secondary | ICD-10-CM | POA: Diagnosis not present

## 2020-06-02 NOTE — Progress Notes (Signed)
Patient here today for Tdap vaccine, patient is past due and had small cut on right hand from lawn mower blade.  Tdap vaccine was injected to left deltoid.  Patient tolerated well.

## 2020-06-15 ENCOUNTER — Other Ambulatory Visit: Payer: Self-pay

## 2020-06-15 ENCOUNTER — Ambulatory Visit: Payer: Medicaid Other | Attending: Family Medicine | Admitting: *Deleted

## 2020-06-15 DIAGNOSIS — R293 Abnormal posture: Secondary | ICD-10-CM | POA: Diagnosis not present

## 2020-06-15 DIAGNOSIS — M542 Cervicalgia: Secondary | ICD-10-CM | POA: Diagnosis not present

## 2020-06-15 NOTE — Therapy (Signed)
[facility] [address] [place], [place], [address] Phone: [phone]   Fax:  [phone]  Physical Therapy Treatment  Patient Details  Name: Oscar Cox MRN: [id number] Date of Birth: 1975-05-11 Referring Provider (PT): [name]   Encounter Date: 06/15/2020   PT End of Session - 06/15/20 1029    Visit Number 2    Number of Visits 12    Date for PT Re-Evaluation 07/11/20    PT Start Time 0900    PT Stop Time 0946    PT Time Calculation (min) 46 min           Past Medical History:  Diagnosis Date  . Acid reflux   . Diabetes mellitus without complication (HCC)   . Essential hypertension, benign 11/27/2016  . Hepatitis C virus infection cured after antiviral drug therapy 09/24/2018  . High cholesterol 11/27/2016    Past Surgical History:  Procedure Laterality Date  . COLONOSCOPY WITH PROPOFOL N/A 03/21/2020   Procedure: COLONOSCOPY WITH PROPOFOL;  Surgeon: Dolores Frame, MD;  [facility];  Service: Gastroenterology;  Laterality: N/A;  am  . NO PAST SURGERIES      There were no vitals filed for this visit.   Subjective Assessment - 06/15/20 0913    Subjective COVID-19 screen performed prior to patient entering clinic.Doing ok    Pertinent History H/o hep C, DM.    How long can you stand comfortably? Varies.    Diagnostic tests X-ray.    Patient Stated Goals Get out of pain and quit passing out.    Currently in Pain? Yes    Pain Score 2     Pain Location Neck    Pain Orientation Mid    Pain Descriptors / Indicators Burning    Pain Type Chronic pain    Pain Onset More than a month ago                             [facility] Adult PT Treatment/Exercise - 06/15/20 0001      Self-Care   Self-Care Posture    Posture Sitting and standing postures starting with neutral pelvis for LB and then scapular retraction/depression, chin tucks all ins sitting and standing, sleeping  postures , wathing TV in bed and  while at computer      Exercises   Exercises Neck;Shoulder      Neck Exercises: Machines for Strengthening   UBE (Upper Arm Bike) x 6 mins with focus on posture      Neck Exercises: Theraband   Scapula Retraction 20 reps   posture re- education, retraction  with depression   Shoulder External Rotation 10 reps   hold 5 secs  for anterior chest stretch     Neck Exercises: Standing   Neck Retraction 10 reps;3 secs   x10 in sitting and x10 in standing   Other Standing Exercises practiced posture in front of mirror for visual feedback      Manual Therapy   Manual Therapy Soft tissue mobilization    Soft tissue mobilization STW to bil cerv paras                       PT Long Term Goals - 05/30/20 1107      PT LONG TERM GOAL #1   Title Independent with a HEP.    Baseline No knowledge of appropriate ther ex.    Time  6    Period Weeks    Status New      PT LONG TERM GOAL #2   Title Stand unlimited with pain not > a 3/10.    Baseline Standing produces severe neck pain.    Time 6    Period Weeks    Status New      PT LONG TERM GOAL #3   Title No episodes of passing out.    Baseline He has passed out a couple of times.    Time 6    Period Weeks    Status New                 Plan - 06/15/20 1030    Clinical Impression Statement Pt arrived today doing fairly well this morning with minimal neck pain. Rx focused on posture re-ed for  neutral pelvis, scapular retraction and depression as well as cervical retraction in sitting and standing. Mirror also used for visual feedback. Sleeping and watching TV postures also discussed to decrease cervical stress. STW performed to cevical paras with notable tension found bil. Handout given for HEP    Personal Factors and Comorbidities Comorbidity 1;Comorbidity 2;Other    Comorbidities H/o hep C, DM.    Rehab Potential Good    PT Frequency 2x / week    PT Duration 6 weeks    PT  Treatment/Interventions ADLs/Self Care Home Management;Therapeutic exercise;Therapeutic activities;Manual techniques    PT Next Visit Plan Postural exercises, STW/M.    Consulted and Agree with Plan of Care Patient           Patient will benefit from skilled therapeutic intervention in order to improve the following deficits and impairments:  Pain,Decreased activity tolerance,Postural dysfunction  Visit Diagnosis: Cervicalgia  Abnormal posture     Problem List Patient Active Problem List   Diagnosis Date Noted  . Callus of foot 05/29/2020  . Seasonal allergies 05/08/2020  . Neck pain 05/08/2020  . Constipation 03/06/2020  . Family history of colon cancer 03/06/2020  . Mixed hyperlipidemia 01/29/2020  . Essential hypertension, benign 11/27/2016  . Diabetic neuropathy with neurologic complication (HCC) 10/29/2016  . Type 2 diabetes mellitus with diabetic neuropathy, without long-term current use of insulin (HCC) 10/29/2016  . Gastroesophageal reflux disease without esophagitis 10/29/2016    Shawntel Farnworth,CHRIS, PTA 06/15/2020, 10:45 AM  [facility] [address] [place], [place], [address] Phone: [phone]   Fax:  [phone]  Name: Oscar Cox MRN: [id number] Date of Birth: [birth year]

## 2020-06-19 ENCOUNTER — Ambulatory Visit: Payer: Medicaid Other | Admitting: *Deleted

## 2020-06-19 ENCOUNTER — Other Ambulatory Visit: Payer: Self-pay

## 2020-06-19 DIAGNOSIS — M542 Cervicalgia: Secondary | ICD-10-CM

## 2020-06-19 DIAGNOSIS — R293 Abnormal posture: Secondary | ICD-10-CM

## 2020-06-19 NOTE — Therapy (Signed)
[facility] [address] [place], [place], [address] Phone: [phone]   Fax:  [phone]  Physical Therapy Treatment  Patient Details  Name: Oscar Cox MRN: [id number] Date of Birth: 12-02-1975 Referring Provider (PT): [name]   Encounter Date: 06/19/2020   PT End of Session - 06/19/20 0908    Visit Number 3    Number of Visits 12    Date for PT Re-Evaluation 07/11/20    PT Start Time 0900    PT Stop Time 0946    PT Time Calculation (min) 46 min           Past Medical History:  Diagnosis Date  . Acid reflux   . Diabetes mellitus without complication (HCC)   . Essential hypertension, benign 11/27/2016  . Hepatitis C virus infection cured after antiviral drug therapy 09/24/2018  . High cholesterol 11/27/2016    Past Surgical History:  Procedure Laterality Date  . COLONOSCOPY WITH PROPOFOL N/A 03/21/2020   Procedure: COLONOSCOPY WITH PROPOFOL;  Surgeon: Dolores Frame, MD;  [facility];  Service: Gastroenterology;  Laterality: N/A;  am  . NO PAST SURGERIES      There were no vitals filed for this visit.   Subjective Assessment - 06/19/20 0906    Subjective COVID-19 screen performed prior to patient entering clinic.Doing ok. Got a crick in my neck over the weekend from the way I fell asleep    Pertinent History H/o hep C, DM.    How long can you stand comfortably? Varies.    Diagnostic tests X-ray.    Patient Stated Goals Get out of pain and quit passing out.    Currently in Pain? Yes    Pain Score 2     Pain Location Neck    Pain Orientation Mid    Pain Descriptors / Indicators Burning    Pain Onset More than a month ago                             [facility] Adult PT Treatment/Exercise - 06/19/20 0001      Self-Care   Self-Care Posture    Posture Sitting and standing postures reviewed :starting with neutral pelvis for LB and then scapular retraction/depression,  chin tucks all in sitting and standing, sleeping postures , watching TV in bed and  while at computer      Neck Exercises: Machines for Strengthening   UBE (Upper Arm Bike) x 6 mins with focus on posture      Neck Exercises: Theraband   Scapula Retraction 20 reps   posture re- education, retraction  with depression   Shoulder External Rotation 20 reps   hold 5 secs  for anterior chest stretch     Neck Exercises: Standing   Neck Retraction 10 reps;3 secs   2x10, added 2 finger OP to last 10   Neck Retraction Limitations Against : posture re-ed with chin tuck, scap retraction/ depression and shldr ER    2x10 hold 5 secs      Neck Exercises: Seated   Other Seated Exercise seated scapular depression pushing hands down into mat table  2x 10 hold 5 secs    Other Seated Exercise seated pelvic tilts to find neutral pelvis for seated posture      Manual Therapy   Manual Therapy Soft tissue mobilization    Soft tissue mobilization STW/ TPR  to bil UT/ Levator scap with good  release                       PT Long Term Goals - 05/30/20 1107      PT LONG TERM GOAL #1   Title Independent with a HEP.    Baseline No knowledge of appropriate ther ex.    Time 6    Period Weeks    Status New      PT LONG TERM GOAL #2   Title Stand unlimited with pain not > a 3/10.    Baseline Standing produces severe neck pain.    Time 6    Period Weeks    Status New      PT LONG TERM GOAL #3   Title No episodes of passing out.    Baseline He has passed out a couple of times.    Time 6    Period Weeks    Status New                 Plan - 06/19/20 0953    Clinical Impression Statement Pt arrived today doing fairly well with minimal neck pain at the moment, but reports getting a "crick" in his neck this weekend. We reviewed sitting and standing postures as well as changes to postures while watching TV in bed and how that can put strain on the  neck. Exs/ act.'s  focused on posture  awareness and mm activation of the shldr blades  and cervical spine. STW/ TPR performed to BIL UTs and Levator scap.    Personal Factors and Comorbidities Comorbidity 1;Comorbidity 2;Other    Comorbidities H/o hep C, DM.    Examination-Activity Limitations Other;Stand    Stability/Clinical Decision Making Evolving/Moderate complexity    Rehab Potential Good    PT Frequency 2x / week    PT Duration 6 weeks    PT Treatment/Interventions ADLs/Self Care Home Management;Therapeutic exercise;Therapeutic activities;Manual techniques    PT Next Visit Plan Postural exercises, STW/M.           Patient will benefit from skilled therapeutic intervention in order to improve the following deficits and impairments:  Pain,Decreased activity tolerance,Postural dysfunction  Visit Diagnosis: Cervicalgia  Abnormal posture     Problem List Patient Active Problem List   Diagnosis Date Noted  . Callus of foot 05/29/2020  . Seasonal allergies 05/08/2020  . Neck pain 05/08/2020  . Constipation 03/06/2020  . Family history of colon cancer 03/06/2020  . Mixed hyperlipidemia 01/29/2020  . Essential hypertension, benign 11/27/2016  . Diabetic neuropathy with neurologic complication (HCC) 10/29/2016  . Type 2 diabetes mellitus with diabetic neuropathy, without long-term current use of insulin (HCC) 10/29/2016  . Gastroesophageal reflux disease without esophagitis 10/29/2016    Kamaya Keckler,CHRIS, PTA 06/19/2020, 11:02 AM  [facility] [address] [place], [place], [address] Phone: [phone]   Fax:  [phone]  Name: MAZE CORNIEL MRN: [id number] Date of Birth: 12/16/1975

## 2020-06-21 ENCOUNTER — Ambulatory Visit: Payer: Medicaid Other | Admitting: Physical Therapy

## 2020-06-26 ENCOUNTER — Ambulatory Visit: Payer: Medicaid Other | Admitting: *Deleted

## 2020-06-26 ENCOUNTER — Other Ambulatory Visit: Payer: Self-pay

## 2020-06-26 DIAGNOSIS — R293 Abnormal posture: Secondary | ICD-10-CM

## 2020-06-26 DIAGNOSIS — M542 Cervicalgia: Secondary | ICD-10-CM | POA: Diagnosis not present

## 2020-06-26 NOTE — Therapy (Signed)
[facility] [place], [place], [address] Phone: [phone]   Fax:  [phone]  Physical Therapy Treatment  Patient Details  Name: Oscar Cox MRN: [id number] Date of Birth: Jan 20, 1975 Referring Provider (PT): Hendricks Limes   Encounter Date: 06/26/2020   PT End of Session - 06/26/20 0812     Visit Number 4    Number of Visits 12    Date for PT Re-Evaluation 07/11/20    PT Start Time 0815    PT Stop Time 0901    PT Time Calculation (min) 46 min             Past Medical History:  Diagnosis Date   Acid reflux    Diabetes mellitus without complication ([place])    Essential hypertension, benign 11/27/2016   Hepatitis C virus infection cured after antiviral drug therapy 09/24/2018   High cholesterol 11/27/2016    Past Surgical History:  Procedure Laterality Date   COLONOSCOPY WITH PROPOFOL N/A 03/21/2020   Procedure: COLONOSCOPY WITH PROPOFOL;  Surgeon: Harvel Quale, MD;  [facility];  Service: Gastroenterology;  Laterality: N/A;  am   NO PAST SURGERIES      There were no vitals filed for this visit.   Subjective Assessment - 06/26/20 0809     Subjective COVID-19 screen performed prior to patient entering clinic.Doing better with decreased neck pain. 2/10 today    Pertinent History H/o hep C, DM.    How long can you stand comfortably? Varies.    Diagnostic tests X-ray.    Patient Stated Goals Get out of pain and quit passing out.    Currently in Pain? Yes    Pain Score 2     Pain Location Neck    Pain Orientation Mid    Pain Descriptors / Indicators Sore    Pain Type Chronic pain    Pain Onset More than a month ago                               [facility] Adult PT Treatment/Exercise - 06/26/20 0001       Self-Care   Self-Care Posture    Posture Sitting and standing postures reviewed :starting with neutral pelvis for LB and then scapular  retraction/depression, chin tucks all in sitting and standing, sleeping postures , watching TV in bed and  while at computer      Exercises   Exercises Neck;Shoulder      Neck Exercises: Machines for Strengthening   UBE (Upper Arm Bike) x 6 mins with focus on posture      Neck Exercises: Theraband   Scapula Retraction 20 reps   Oscar XTS 2x10 hold 5secs   Scapula Retraction Limitations Focus on posture alignment before starting    Shoulder Extension 20 reps;Oscar   XTS Oscar 2x10 hold 5 secs   Shoulder Extension Limitations Focus on posture alignment before starting    Shoulder External Rotation 10 reps;Red   hold 5 secs  for anterior chest stretch     Neck Exercises: Standing   Neck Retraction 10 reps;5 secs   2x10, added 2 finger OP   Neck Retraction Limitations Against : posture re-ed with chin tuck, scap retraction/ depression and shldr ER    2x10 hold 5 secs      Neck Exercises: Seated   Other Seated Exercise seated scapular depression pushing hands down into mat table  2x 10 hold  5 secs      Manual Therapy   Manual Therapy Soft tissue mobilization    Soft tissue mobilization STW/ TPR  to bil UT/ Levator scap with good release                         PT Long Term Goals - 06/26/20 0845       PT LONG TERM GOAL #1   Title Independent with a HEP.    Baseline No knowledge of appropriate ther ex.    Period Weeks    Status Partially Met      PT LONG TERM GOAL #2   Title Stand unlimited with pain not > a 3/10.    Baseline Standing produces severe neck pain.    Time 6    Period Weeks    Status Partially Met      PT LONG TERM GOAL #3   Title No episodes of passing out.    Baseline He has passed out a couple of times.    Time 6    Period Weeks    Status Achieved                   Plan - 06/26/20 8413     Clinical Impression Statement Pt arrived today doing better with decreased neck pain with ADL's and reports no episodes of passing out. Rx  continues to focus on posture education with good spinal alignment as well as re-ed and strengthening of scapular musculature. Cerv. rot 78 degrees Bil.    Personal Factors and Comorbidities Comorbidity 1;Comorbidity 2;Other    Comorbidities H/o hep C, DM.    Examination-Activity Limitations Other;Stand    Stability/Clinical Decision Making Evolving/Moderate complexity    Rehab Potential Good    PT Frequency 2x / week    PT Duration 6 weeks    PT Treatment/Interventions ADLs/Self Care Home Management;Therapeutic exercise;Therapeutic activities;Manual techniques    PT Next Visit Plan Postural exercises, STW/M. Continue with postire cues during exs             Patient will benefit from skilled therapeutic intervention in order to improve the following deficits and impairments:  Pain, Decreased activity tolerance, Postural dysfunction  Visit Diagnosis: Cervicalgia  Abnormal posture     Problem List Patient Active Problem List   Diagnosis Date Noted   Callus of foot 05/29/2020   Seasonal allergies 05/08/2020   Neck pain 05/08/2020   Constipation 03/06/2020   Family history of colon cancer 03/06/2020   Mixed hyperlipidemia 01/29/2020   Essential hypertension, benign 11/27/2016   Diabetic neuropathy with neurologic complication ([place]) 24/40/1027   Type 2 diabetes mellitus with diabetic neuropathy, without long-term current use of insulin ([place]) 10/29/2016   Gastroesophageal reflux disease without esophagitis 10/29/2016    Bilan Tedesco,CHRIS, PTA 06/26/2020, 9:12 AM  [facility] [address] [place], [place], [address] Phone: [phone]   Fax:  [phone]  Name: Oscar Cox MRN: [id number] Date of Birth: 1975/04/13

## 2020-06-27 ENCOUNTER — Ambulatory Visit: Payer: Medicaid Other | Admitting: Family Medicine

## 2020-06-27 ENCOUNTER — Encounter: Payer: Self-pay | Admitting: Family Medicine

## 2020-06-27 VITALS — BP 124/79 | HR 99 | Temp 96.8°F | Ht 73.0 in | Wt 195.2 lb

## 2020-06-27 DIAGNOSIS — S90421A Blister (nonthermal), right great toe, initial encounter: Secondary | ICD-10-CM | POA: Diagnosis not present

## 2020-06-27 DIAGNOSIS — T25031A Burn of unspecified degree of right toe(s) (nail), initial encounter: Secondary | ICD-10-CM | POA: Diagnosis not present

## 2020-06-27 DIAGNOSIS — T148XXA Other injury of unspecified body region, initial encounter: Secondary | ICD-10-CM

## 2020-06-27 DIAGNOSIS — T300 Burn of unspecified body region, unspecified degree: Secondary | ICD-10-CM

## 2020-06-27 MED ORDER — SILVER SULFADIAZINE 1 % EX CREA: 1.0000 "application " | TOPICAL_CREAM | Freq: Every day | CUTANEOUS | 0 refills | Status: DC
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
## 2020-06-28 ENCOUNTER — Encounter: Payer: Medicaid Other | Admitting: Physical Therapy

## 2020-07-03 ENCOUNTER — Ambulatory Visit: Payer: Medicaid Other | Admitting: Physical Therapy

## 2020-07-05 ENCOUNTER — Ambulatory Visit: Payer: Medicaid Other | Admitting: *Deleted

## 2020-07-07 ENCOUNTER — Encounter: Payer: Self-pay | Admitting: Family Medicine

## 2020-07-07 ENCOUNTER — Other Ambulatory Visit: Payer: Self-pay

## 2020-07-07 ENCOUNTER — Ambulatory Visit (INDEPENDENT_AMBULATORY_CARE_PROVIDER_SITE_OTHER): Payer: Medicaid Other | Admitting: Family Medicine

## 2020-07-07 VITALS — BP 116/77 | HR 107 | Temp 97.4°F | Ht 73.0 in | Wt 195.4 lb

## 2020-07-07 DIAGNOSIS — L03031 Cellulitis of right toe: Secondary | ICD-10-CM

## 2020-07-11 ENCOUNTER — Encounter: Payer: Self-pay | Admitting: Family Medicine

## 2020-07-11 MED ORDER — AMOXICILLIN 875 MG PO TABS: 875.0000 mg | ORAL_TABLET | Freq: Two times a day (BID) | ORAL | 0 refills | Status: AC

## 2020-07-11 NOTE — Progress Notes (Signed)
Chief Complaint  Patient presents with   Toe Pain    Right great toe blister    HPI  Patient presents today for concern for a blister on the right great toe.  Is been present for several days.  It has been somewhat painful  PMH: Smoking status noted ROS: Per HPI  Objective: BP 116/77   Pulse (!) 107   Temp (!) 97.4 F (36.3 C)   Ht 6\' 1"  (1.854 m)   Wt 195 lb 6.4 oz (88.6 kg)   SpO2 96%   BMI 25.78 kg/m  Gen: NAD, alert, cooperative with exam HEENT: NCAT, Ext: No edema, warm   there is a vesicle with dark appearance due to bleeding within it.  It is at the dorsum of the IP joint region.  It is approximately a centimeter.  There is some erythema spreading from the base of the vesicle to the MTP joint.   Neuro: Alert and oriented, No gross deficits  Assessment and plan:  1. Cellulitis of toe of right foot     Meds ordered this encounter  Medications   amoxicillin (AMOXIL) 875 MG tablet    Sig: Take 1 tablet (875 mg total) by mouth 2 (two) times daily for 10 days.    Dispense:  20 tablet    Refill:  0     No orders of the defined types were placed in this encounter.   Follow up as needed.  , MD

## 2020-07-17 ENCOUNTER — Other Ambulatory Visit: Payer: Self-pay | Admitting: Family Medicine

## 2020-07-17 DIAGNOSIS — E1140 Type 2 diabetes mellitus with diabetic neuropathy, unspecified: Secondary | ICD-10-CM

## 2020-07-17 DIAGNOSIS — E782 Mixed hyperlipidemia: Secondary | ICD-10-CM

## 2020-07-25 ENCOUNTER — Other Ambulatory Visit: Payer: Self-pay

## 2020-07-25 ENCOUNTER — Encounter: Payer: Self-pay | Admitting: Family Medicine

## 2020-07-25 ENCOUNTER — Ambulatory Visit: Payer: Medicaid Other | Admitting: Family Medicine

## 2020-07-25 VITALS — BP 109/77 | HR 101 | Temp 97.3°F | Ht 73.0 in | Wt 195.0 lb

## 2020-07-25 DIAGNOSIS — S91101A Unspecified open wound of right great toe without damage to nail, initial encounter: Secondary | ICD-10-CM

## 2020-07-25 DIAGNOSIS — S91101D Unspecified open wound of right great toe without damage to nail, subsequent encounter: Secondary | ICD-10-CM | POA: Diagnosis not present
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
## 2020-07-28 ENCOUNTER — Other Ambulatory Visit: Payer: Self-pay

## 2020-07-28 ENCOUNTER — Ambulatory Visit (INDEPENDENT_AMBULATORY_CARE_PROVIDER_SITE_OTHER): Payer: Medicaid Other

## 2020-07-28 ENCOUNTER — Other Ambulatory Visit: Payer: Self-pay | Admitting: Podiatry

## 2020-07-28 ENCOUNTER — Ambulatory Visit: Payer: Medicaid Other | Admitting: Podiatry

## 2020-07-28 DIAGNOSIS — L97511 Non-pressure chronic ulcer of other part of right foot limited to breakdown of skin: Secondary | ICD-10-CM | POA: Diagnosis not present

## 2020-07-28 DIAGNOSIS — M7752 Other enthesopathy of left foot: Secondary | ICD-10-CM | POA: Diagnosis not present

## 2020-07-28 DIAGNOSIS — L97519 Non-pressure chronic ulcer of other part of right foot with unspecified severity: Secondary | ICD-10-CM

## 2020-07-28 DIAGNOSIS — M7751 Other enthesopathy of right foot: Secondary | ICD-10-CM | POA: Diagnosis not present

## 2020-07-28 DIAGNOSIS — M79671 Pain in right foot: Secondary | ICD-10-CM

## 2020-07-28 DIAGNOSIS — M21961 Unspecified acquired deformity of right lower leg: Secondary | ICD-10-CM

## 2020-07-28 DIAGNOSIS — M79672 Pain in left foot: Secondary | ICD-10-CM
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
## 2020-07-31 DIAGNOSIS — H5213 Myopia, bilateral: Secondary | ICD-10-CM | POA: Diagnosis not present

## 2020-08-03 ENCOUNTER — Ambulatory Visit: Payer: Medicaid Other | Admitting: Family Medicine

## 2020-08-04 ENCOUNTER — Other Ambulatory Visit: Payer: Self-pay | Admitting: Family Medicine

## 2020-08-04 DIAGNOSIS — E1140 Type 2 diabetes mellitus with diabetic neuropathy, unspecified: Secondary | ICD-10-CM

## 2020-08-15 ENCOUNTER — Ambulatory Visit: Payer: Medicaid Other | Admitting: Family Medicine

## 2020-08-17 ENCOUNTER — Ambulatory Visit: Payer: Medicaid Other | Admitting: Family Medicine

## 2020-08-18 ENCOUNTER — Ambulatory Visit (INDEPENDENT_AMBULATORY_CARE_PROVIDER_SITE_OTHER): Payer: Medicaid Other | Admitting: Podiatry

## 2020-08-18 ENCOUNTER — Telehealth: Payer: Self-pay | Admitting: Urology

## 2020-08-18 DIAGNOSIS — Z5329 Procedure and treatment not carried out because of patient's decision for other reasons: Secondary | ICD-10-CM

## 2020-08-18 NOTE — Telephone Encounter (Addendum)
DOS - 08/23/20  MET HEAD RES. 5TH RIGHT --- [phone]  MEDICAID HEALTHY BLUE EFFECTIVE DATE 07/14/20   PER [facility] SITE HEALTHY BLUE HAS APPROVED CPT CODE 83382, [id number], GOOD FROM 08/23/2020 - 10/21/2020.  REF # [id number]

## 2020-08-20 ENCOUNTER — Other Ambulatory Visit: Payer: Self-pay | Admitting: Family Medicine

## 2020-08-20 DIAGNOSIS — K219 Gastro-esophageal reflux disease without esophagitis: Secondary | ICD-10-CM

## 2020-08-20 DIAGNOSIS — J302 Other seasonal allergic rhinitis: Secondary | ICD-10-CM

## 2020-08-22 ENCOUNTER — Ambulatory Visit (INDEPENDENT_AMBULATORY_CARE_PROVIDER_SITE_OTHER): Payer: Medicaid Other | Admitting: Family Medicine

## 2020-08-22 ENCOUNTER — Encounter: Payer: Self-pay | Admitting: Family Medicine

## 2020-08-22 ENCOUNTER — Other Ambulatory Visit: Payer: Self-pay

## 2020-08-22 VITALS — BP 111/76 | HR 98 | Temp 97.3°F | Ht 73.0 in | Wt 194.2 lb

## 2020-08-22 DIAGNOSIS — M5412 Radiculopathy, cervical region: Secondary | ICD-10-CM | POA: Diagnosis not present

## 2020-08-22 DIAGNOSIS — K219 Gastro-esophageal reflux disease without esophagitis: Secondary | ICD-10-CM

## 2020-08-22 DIAGNOSIS — E1149 Type 2 diabetes mellitus with other diabetic neurological complication: Secondary | ICD-10-CM | POA: Diagnosis not present

## 2020-08-22 DIAGNOSIS — I10 Essential (primary) hypertension: Secondary | ICD-10-CM | POA: Diagnosis not present

## 2020-08-22 DIAGNOSIS — E1140 Type 2 diabetes mellitus with diabetic neuropathy, unspecified: Secondary | ICD-10-CM

## 2020-08-22 DIAGNOSIS — E782 Mixed hyperlipidemia: Secondary | ICD-10-CM | POA: Diagnosis not present

## 2020-08-22 DIAGNOSIS — M542 Cervicalgia: Secondary | ICD-10-CM

## 2020-08-22 LAB — BAYER DCA HB A1C WAIVED: HB A1C (BAYER DCA - WAIVED): 5.8 % (ref <7.0)

## 2020-08-22 MED ORDER — LISINOPRIL 20 MG PO TABS
20.0000 mg | ORAL_TABLET | Freq: Every day | ORAL | 1 refills | Status: DC
Start: 2020-08-22 — End: 2021-02-08

## 2020-08-22 NOTE — Progress Notes (Signed)
Assessment & Plan:  1. Type 2 diabetes mellitus with diabetic neuropathy, without long-term current use of insulin (HCC) Lab Results  Component Value Date   HGBA1C 5.7 05/05/2020   HGBA1C 7.9 (H) 01/26/2020   HGBA1C 7.3 (H) 10/12/2019  A1c 5.8 today; 5.7 three months ago. - Diabetes is at goal of A1c < 7. - Medications: continue current medications - Home glucose monitoring: continue monitoring - Patient is currently taking a statin. Patient is taking an ACE-inhibitor/ARB.  - Instruction/counseling given: reminded to get eye exam  Diabetes Health Maintenance Due  Topic Date Due   OPHTHALMOLOGY EXAM  01/23/2019   HEMOGLOBIN A1C  11/04/2020   FOOT EXAM  05/05/2021    Lab Results  Component Value Date   LABMICR 3.5 01/26/2020   LABMICR 28.7 03/12/2019   - Lipid panel - CMP14+EGFR - CBC with Differential/Platelet - Bayer DCA Hb A1c Waived  2. Diabetic neuropathy with neurologic complication (HCC) Well controlled on current regimen.   3. Essential hypertension, benign Well controlled on current regimen.  - CMP14+EGFR - CBC with Differential/Platelet - lisinopril (ZESTRIL) 20 MG tablet; Take 1 tablet (20 mg total) by mouth daily.  Dispense: 90 tablet; Refill: 1  4. Mixed hyperlipidemia Well controlled on current regimen.  - Lipid panel - CMP14+EGFR  5. Gastroesophageal reflux disease without esophagitis Well controlled on current regimen.   - CMP14+EGFR  6-7. Neck pain/Cervical radiculopathy Unable to complete physical therapy at this time due to family circumstances. Declined referral to ortho at this time as well.    Return in about 6 months (around 02/22/2021) for annual physical.  [name], MSN, APRN, FNP-C [facility]  Subjective:    Patient ID: Oscar Cox, male    DOB: [birth year], 45 y.o.   MRN: [id number]  Patient Care Team: Gwenlyn Fudge, FNP as PCP - General (Family Medicine) Delora Fuel, OD (Optometry)    Chief Complaint:  Chief Complaint  Patient presents with   Diabetes   Hypertension    3 month follow up of chronic medical conditions     HPI: Oscar Cox is a 45 y.o. male presenting on 08/22/2020 for Diabetes and Hypertension (3 month follow up of chronic medical conditions )  Diabetes: Patient presents for follow up of diabetes. Current symptoms include: hyperglycemia and paresthesia of the feet. Known diabetic complications: peripheral neuropathy. Medication compliance: yes. Current diet: in general, an "unhealthy" diet. Current exercise:  stays active . Home blood sugar records: BGs range between 120 and 140 . Is he  on ACE inhibitor or angiotensin II receptor blocker? Yes. Is he on a statin? Yes.   Hypertension: rarely checking BP at home. When he does he reports he gets readings similar to ours today. Denies feeling lightheaded or like he is going to pass out.   Hyperlipidemia: taking Pravastatin. Stays active. Does smoke.   Neck pain: Started, but unable to complete physical therapy due to family situation. He is caring for multiple people at home. Brother has cancer and helping him interferes with his own appointments. He reports he is now experiencing numbness in his last three fingers on both hands.  New complaints: None   Social history:  Relevant past medical, surgical, family and social history reviewed and updated as indicated. Interim medical history since our last visit reviewed.  Allergies and medications reviewed and updated.  DATA REVIEWED: CHART IN EPIC  ROS: Negative unless specifically indicated above in HPI.    Current Outpatient Medications:  acetaminophen (TYLENOL) 500 MG tablet, Take 1,000 mg by mouth every 8 (eight) hours as needed (pain.)., Disp: , Rfl:    EQ ALLERGY RELIEF, CETIRIZINE, 10 MG tablet, Take 1 tablet by mouth once daily, Disp: 90 tablet, Rfl: 0   fluticasone (FLONASE) 50 MCG/ACT nasal spray, Use 2 spray(s) in each nostril once  daily, Disp: 48 g, Rfl: 0   gabapentin (NEURONTIN) 400 MG capsule, TAKE 1 CAPSULE BY MOUTH THREE TIMES DAILY, Disp: 90 capsule, Rfl: 0   glipiZIDE (GLUCOTROL XL) 5 MG 24 hr tablet, Take 1 tablet by mouth once daily with breakfast, Disp: 90 tablet, Rfl: 0   GLYXAMBI 25-5 MG TABS, Take 1 tablet by mouth once daily, Disp: 90 tablet, Rfl: 0   ibuprofen (ADVIL) 600 MG tablet, Take 1 tablet (600 mg total) by mouth every 8 (eight) hours as needed., Disp: 30 tablet, Rfl: 0   lisinopril (ZESTRIL) 20 MG tablet, Take 1 tablet (20 mg total) by mouth daily., Disp: 30 tablet, Rfl: 2   montelukast (SINGULAIR) 10 MG tablet, TAKE 1 TABLET BY MOUTH AT BEDTIME, Disp: 30 tablet, Rfl: 0   omeprazole (PRILOSEC) 20 MG capsule, Take 1 capsule by mouth once daily, Disp: 90 capsule, Rfl: 0   pravastatin (PRAVACHOL) 40 MG tablet, Take 1 tablet by mouth once daily, Disp: 90 tablet, Rfl: 0   Allergies  Allergen Reactions   Tamiflu [Oseltamivir Phosphate] Hives   Metformin And Related Diarrhea   Morphine Nausea Only   Potassium Chloride Nausea And Vomiting   Past Medical History:  Diagnosis Date   Acid reflux    Diabetes mellitus without complication ([place])    Essential hypertension, benign 11/27/2016   Hepatitis C virus infection cured after antiviral drug therapy 09/24/2018   High cholesterol 11/27/2016    Past Surgical History:  Procedure Laterality Date   COLONOSCOPY WITH PROPOFOL N/A 03/21/2020   Procedure: COLONOSCOPY WITH PROPOFOL;  Surgeon: Harvel Quale, MD;  Location: [facility];  Service: Gastroenterology;  Laterality: N/A;  am   NO PAST SURGERIES      Social History   Socioeconomic History   Marital status: Single    Spouse name: Not on file   Number of children: Not on file   Years of education: Not on file   Highest education level: Not on file  Occupational History   Not on file  Tobacco Use   Smoking status: Every Day    Packs/day: 1.00    Types: Cigarettes   Smokeless  tobacco: Never  Vaping Use   Vaping Use: Never used  Substance and Sexual Activity   Alcohol use: Yes    Comment: rarely   Drug use: Yes    Frequency: 3.0 times per week    Types: Marijuana    Comment: 03/19/20 last used   Sexual activity: Not on file  Other Topics Concern   Not on file  Social History Narrative   Not on file   Social Determinants of Health   Financial Resource Strain: Not on file  Food Insecurity: Not on file  Transportation Needs: Not on file  Physical Activity: Not on file  Stress: Not on file  Social Connections: Not on file  Intimate Partner Violence: Not on file        Objective:    BP 111/76   Pulse 98   Temp (!) 97.3 F (36.3 C) (Temporal)   Ht $R'6\' 1"'yd$  (1.854 m)   Wt 194 lb 3.2 oz (88.1 kg)  SpO2 98%   BMI 25.62 kg/m   Wt Readings from Last 3 Encounters:  08/22/20 194 lb 3.2 oz (88.1 kg)  07/25/20 195 lb (88.5 kg)  07/07/20 195 lb 6.4 oz (88.6 kg)   Physical Exam Vitals reviewed.  Constitutional:      General: He is not in acute distress.    Appearance: Normal appearance. He is normal weight. He is not ill-appearing, toxic-appearing or diaphoretic.  HENT:     Head: Normocephalic and atraumatic.  Eyes:     General: No scleral icterus.       Right eye: No discharge.        Left eye: No discharge.     Conjunctiva/sclera: Conjunctivae normal.  Cardiovascular:     Rate and Rhythm: Normal rate and regular rhythm.     Heart sounds: Normal heart sounds. No murmur heard.   No friction rub. No gallop.  Pulmonary:     Effort: Pulmonary effort is normal. No respiratory distress.     Breath sounds: Normal breath sounds. No stridor. No wheezing, rhonchi or rales.  Musculoskeletal:        General: Normal range of motion.     Cervical back: Normal range of motion.  Skin:    General: Skin is warm and dry.  Neurological:     Mental Status: He is alert and oriented to person, place, and time. Mental status is at baseline.  Psychiatric:         Mood and Affect: Mood normal.        Behavior: Behavior normal.        Thought Content: Thought content normal.        Judgment: Judgment normal.    Lab Results  Component Value Date   TSH 0.76 03/06/2020   Lab Results  Component Value Date   WBC 10.8 05/05/2020   HGB 15.2 05/05/2020   HCT 44.8 05/05/2020   MCV 91 05/05/2020   PLT 239 05/05/2020   Lab Results  Component Value Date   NA 139 05/05/2020   K 4.5 05/05/2020   CO2 23 05/05/2020   GLUCOSE 123 (H) 05/05/2020   BUN 21 05/05/2020   CREATININE 1.00 05/05/2020   BILITOT 0.3 05/05/2020   ALKPHOS 68 05/05/2020   AST 23 05/05/2020   ALT 31 05/05/2020   PROT 7.0 05/05/2020   ALBUMIN 4.4 05/05/2020   CALCIUM 9.7 05/05/2020   ANIONGAP 10 09/30/2014   EGFR 95 05/05/2020   Lab Results  Component Value Date   CHOL 125 05/05/2020   Lab Results  Component Value Date   HDL 38 (L) 05/05/2020   Lab Results  Component Value Date   LDLCALC 68 05/05/2020   Lab Results  Component Value Date   TRIG 102 05/05/2020   Lab Results  Component Value Date   CHOLHDL 3.3 05/05/2020   Lab Results  Component Value Date   HGBA1C 5.7 05/05/2020

## 2020-08-23 LAB — CMP14+EGFR
ALT: 28 IU/L (ref 0–44)
AST: 21 IU/L (ref 0–40)
Albumin/Globulin Ratio: 1.7 (ref 1.2–2.2)
Albumin: 4.3 g/dL (ref 4.0–5.0)
Alkaline Phosphatase: 66 IU/L (ref 44–121)
BUN/Creatinine Ratio: 19 (ref 9–20)
BUN: 20 mg/dL (ref 6–24)
Bilirubin Total: 0.3 mg/dL (ref 0.0–1.2)
CO2: 22 mmol/L (ref 20–29)
Calcium: 9.7 mg/dL (ref 8.7–10.2)
Chloride: 101 mmol/L (ref 96–106)
Creatinine, Ser: 1.03 mg/dL (ref 0.76–1.27)
Globulin, Total: 2.5 g/dL (ref 1.5–4.5)
Glucose: 128 mg/dL — ABNORMAL HIGH (ref 65–99)
Potassium: 4.1 mmol/L (ref 3.5–5.2)
Sodium: 137 mmol/L (ref 134–144)
Total Protein: 6.8 g/dL (ref 6.0–8.5)
eGFR: 92 mL/min/{1.73_m2} (ref >59)

## 2020-08-23 LAB — LIPID PANEL
Chol/HDL Ratio: 3.1 ratio (ref 0.0–5.0)
Cholesterol, Total: 118 mg/dL (ref 100–199)
HDL: 38 mg/dL — ABNORMAL LOW (ref >39)
LDL Chol Calc (NIH): 60 mg/dL (ref 0–99)
Triglycerides: 108 mg/dL (ref 0–149)
VLDL Cholesterol Cal: 20 mg/dL (ref 5–40)

## 2020-08-23 LAB — CBC WITH DIFFERENTIAL/PLATELET
Basophils Absolute: 0.1 10*3/uL (ref 0.0–0.2)
Basos: 1 %
EOS (ABSOLUTE): 0.6 10*3/uL — ABNORMAL HIGH (ref 0.0–0.4)
Eos: 4 %
Hematocrit: 45.7 % (ref 37.5–51.0)
Hemoglobin: 14.8 g/dL (ref 13.0–17.7)
Immature Grans (Abs): 0 10*3/uL (ref 0.0–0.1)
Immature Granulocytes: 0 %
Lymphocytes Absolute: 3.9 10*3/uL — ABNORMAL HIGH (ref 0.7–3.1)
Lymphs: 28 %
MCH: 28.7 pg (ref 26.6–33.0)
MCHC: 32.4 g/dL (ref 31.5–35.7)
MCV: 89 fL (ref 79–97)
Monocytes Absolute: 0.9 10*3/uL (ref 0.1–0.9)
Monocytes: 6 %
Neutrophils Absolute: 8.4 10*3/uL — ABNORMAL HIGH (ref 1.4–7.0)
Neutrophils: 61 %
Platelets: 241 10*3/uL (ref 150–450)
RBC: 5.16 x10E6/uL (ref 4.14–5.80)
RDW: 12.5 % (ref 11.6–15.4)
WBC: 13.8 10*3/uL — ABNORMAL HIGH (ref 3.4–10.8)

## 2020-08-24 ENCOUNTER — Other Ambulatory Visit: Payer: Self-pay | Admitting: Family Medicine

## 2020-08-24 DIAGNOSIS — J302 Other seasonal allergic rhinitis: Secondary | ICD-10-CM

## 2020-08-25 ENCOUNTER — Other Ambulatory Visit: Payer: Self-pay

## 2020-08-25 ENCOUNTER — Ambulatory Visit: Payer: Medicaid Other | Admitting: Podiatry

## 2020-08-25 DIAGNOSIS — M7751 Other enthesopathy of right foot: Secondary | ICD-10-CM | POA: Diagnosis not present

## 2020-08-25 DIAGNOSIS — M7752 Other enthesopathy of left foot: Secondary | ICD-10-CM | POA: Diagnosis not present

## 2020-08-25 DIAGNOSIS — M21961 Unspecified acquired deformity of right lower leg: Secondary | ICD-10-CM

## 2020-08-25 MED ORDER — OXYCODONE-ACETAMINOPHEN 5-325 MG PO TABS: 1.0000 | ORAL_TABLET | ORAL | 0 refills | Status: DC | PRN

## 2020-08-25 MED ORDER — CEPHALEXIN 500 MG PO CAPS: 500.0000 mg | ORAL_CAPSULE | Freq: Two times a day (BID) | ORAL | 0 refills | Status: DC
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
## 2020-08-29 ENCOUNTER — Encounter: Payer: Medicaid Other | Admitting: Podiatry

## 2020-08-29 ENCOUNTER — Encounter: Payer: Self-pay | Admitting: Family Medicine

## 2020-08-30 ENCOUNTER — Encounter: Payer: Self-pay | Admitting: Podiatry

## 2020-08-30 ENCOUNTER — Other Ambulatory Visit: Payer: Self-pay | Admitting: Podiatry

## 2020-08-30 DIAGNOSIS — M25774 Osteophyte, right foot: Secondary | ICD-10-CM | POA: Diagnosis not present

## 2020-08-30 DIAGNOSIS — M7751 Other enthesopathy of right foot: Secondary | ICD-10-CM | POA: Diagnosis not present

## 2020-08-30 DIAGNOSIS — M7741 Metatarsalgia, right foot: Secondary | ICD-10-CM | POA: Diagnosis not present

## 2020-09-04 ENCOUNTER — Ambulatory Visit (INDEPENDENT_AMBULATORY_CARE_PROVIDER_SITE_OTHER): Payer: Medicaid Other | Admitting: Gastroenterology

## 2020-09-04 ENCOUNTER — Other Ambulatory Visit: Payer: Self-pay

## 2020-09-04 ENCOUNTER — Encounter (INDEPENDENT_AMBULATORY_CARE_PROVIDER_SITE_OTHER): Payer: Self-pay | Admitting: Gastroenterology

## 2020-09-04 VITALS — BP 126/85 | HR 105 | Temp 98.6°F | Ht 73.0 in | Wt 191.7 lb

## 2020-09-04 DIAGNOSIS — K5900 Constipation, unspecified: Secondary | ICD-10-CM

## 2020-09-04 DIAGNOSIS — R1013 Epigastric pain: Secondary | ICD-10-CM | POA: Insufficient documentation

## 2020-09-04 NOTE — Progress Notes (Signed)
Referring Provider: Gwenlyn Fudge, FNP Primary Care Physician:  Gwenlyn Fudge, FNP Primary GI Physician: Levon Hedger  Chief Complaint  Patient presents with   Follow-up    6 month follow up. Pt states he started having diarrhea a couple weeks ago and is better. Last epidsode of diarrhea was 2 days ago, has a little gas from time to time, has one stool every one to two days, states appetite is good    HPI:   Oscar Cox is a 45 y.o. male with past medical history of DM, HLD, HTN, Hepatitis C s/p Mavyret positive SVR and GERD. Patient presenting today for follow up of constipation.  Patient had Hep C previously, treated with Mavyret, HCVRNA in feb 2021 was negative. liver elastography was negative for any significant fibrosis (03/03/19).   GERD:Omeprazole 20mg  daily, pcp manages. Denies any reflux or heartburn, dysphagia or odynophagia.   Constipation:states he had increased fiber intake in his diet which had helped some. Still having some constipation, however, he feels it is manageable. BMs every 1-2 days. States that he tried miralax and benefiber but would forget to take it daily, therefore, he is happy with just doing dietary modification at this time.   States that he has had some diarrhea for the past 2 weeks that has started to improve, has not had any for the last 2 days, thinks he may have had a GI virus, denies any associated symptoms.   Reports for the past year, that after eating he is having mid epigastric pain, especially after eating a heavy or greasy meal, he denies radiation of pain to back or shoulders. He denies nausea or vomiting. He reports that pain typically occurs about once per day. Reports symptoms typically don't present if he does not eat heavy/greasy foods. No melena or hematochezia.   Denies frequent NSAID use. No red flag symptoms. Patient denies melena, hematochezia, nausea, vomiting, dysphagia, odyonophagia, early satiety or weight loss.   Last  Colonoscopy:(03/21/20)normal ileum, diverticulosis in sigmoid colon, descending colon and ascending colon. Non bleeding internal hemorrhoids, no specimens collected.  Last Endoscopy:n/a Family and Social History: Father had colon cancer at 29, grandfather pancreatic cancer Cigarettes and marijuana, formerly used heroin/cocaine  Recommendations:  Repeat colonoscopy in 5 years given family hx of colon cancer  Past Medical History:  Diagnosis Date   Acid reflux    Diabetes mellitus without complication (HCC)    Essential hypertension, benign 11/27/2016   Hepatitis C virus infection cured after antiviral drug therapy 09/24/2018   High cholesterol 11/27/2016    Past Surgical History:  Procedure Laterality Date   COLONOSCOPY WITH PROPOFOL N/A 03/21/2020   Castaneda:normal ileum, diverticulosis in sigmoid colon, descending colon and ascending colon. Non bleeding internal hemorrhoids, no specimens collected.   NO PAST SURGERIES      Current Outpatient Medications  Medication Sig Dispense Refill   cephALEXin (KEFLEX) 500 MG capsule Take 1 capsule (500 mg total) by mouth 2 (two) times daily. Take 1 tablet by mouth tonight, then one tomorrow AM and one in PM 14 capsule 0   cetirizine (ZYRTEC) 10 MG tablet Take 1 tablet by mouth once daily 90 tablet 1   fluticasone (FLONASE) 50 MCG/ACT nasal spray Use 2 spray(s) in each nostril once daily 16 g 4   gabapentin (NEURONTIN) 400 MG capsule TAKE 1 CAPSULE BY MOUTH THREE TIMES DAILY 90 capsule 0   glipiZIDE (GLUCOTROL XL) 5 MG 24 hr tablet Take 1 tablet by mouth once daily with  breakfast 90 tablet 0   GLYXAMBI 25-5 MG TABS Take 1 tablet by mouth once daily 90 tablet 0   lisinopril (ZESTRIL) 20 MG tablet Take 1 tablet (20 mg total) by mouth daily. 90 tablet 1   montelukast (SINGULAIR) 10 MG tablet TAKE 1 TABLET BY MOUTH AT BEDTIME 30 tablet 0   omeprazole (PRILOSEC) 20 MG capsule Take 1 capsule by mouth once daily 90 capsule 0   pravastatin (PRAVACHOL)  40 MG tablet Take 1 tablet by mouth once daily 90 tablet 0   No current facility-administered medications for this visit.    Allergies as of 09/04/2020 - Review Complete 09/04/2020  Allergen Reaction Noted   Tamiflu [oseltamivir phosphate] Hives 06/03/2012   Metformin and related Diarrhea 10/12/2019   Morphine Nausea Only 10/29/2016   Potassium chloride Nausea And Vomiting 06/03/2012    Family History  Problem Relation Age of Onset   Hypertension Mother    Breast cancer Mother    Diabetes Father    Heart disease Father    Hypertension Father    Colon cancer Father    Diabetes Brother    Hypertension Brother    Thyroid cancer Brother        Anaplastic    Social History   Socioeconomic History   Marital status: Single    Spouse name: Not on file   Number of children: Not on file   Years of education: Not on file   Highest education level: Not on file  Occupational History   Not on file  Tobacco Use   Smoking status: Every Day    Packs/day: 1.00    Types: Cigarettes   Smokeless tobacco: Never  Vaping Use   Vaping Use: Never used  Substance and Sexual Activity   Alcohol use: Yes    Comment: rarely   Drug use: Yes    Frequency: 3.0 times per week    Types: Marijuana    Comment: 03/19/20 last used   Sexual activity: Not on file  Other Topics Concern   Not on file  Social History Narrative   Not on file   Social Determinants of Health   Financial Resource Strain: Not on file  Food Insecurity: Not on file  Transportation Needs: Not on file  Physical Activity: Not on file  Stress: Not on file  Social Connections: Not on file   Review of Systems: Gen: Denies fever, chills, anorexia. Denies fatigue, weakness, weight loss.  CV: Denies chest pain, palpitations, syncope, peripheral edema, and claudication. Resp: Denies dyspnea at rest, cough, wheezing, coughing up blood, and pleurisy. GI: Denies vomiting blood, jaundice, and fecal incontinence. Denies dysphagia  or odynophagia. Positive for epigastric pain, postprandial Derm: Denies rash, itching, dry skin Psych: Denies depression, anxiety, memory loss, confusion. No homicidal or suicidal ideation.  Heme: Denies bruising, bleeding, and enlarged lymph nodes.  Physical Exam: BP 126/85 (BP Location: Right Arm, Patient Position: Sitting, Cuff Size: Large)   Pulse (!) 105   Temp 98.6 F (37 C) (Oral)   Ht 6\' 1"  (1.854 m)   Wt 191 lb 11.2 oz (87 kg)   BMI 25.29 kg/m  General:   Alert and oriented. No distress noted. Pleasant and cooperative.  Head:  Normocephalic and atraumatic. Eyes:  Conjuctiva clear without scleral icterus. Mouth:  Oral mucosa pink and moist. Good dentition. No lesions. Heart: Normal rate and rhythm, s1 and s2 heart sounds present.  Lungs: Clear lung sounds in all lobes. Respirations equal and unlabored. Abdomen:  +BS,  soft, non-tender and non-distended. No rebound or guarding. No HSM or masses noted. Derm: No palmar erythema or jaundice Msk:  Symmetrical without gross deformities. Normal posture. Extremities:  Without edema. Neurologic:  Alert and  oriented x4 Psych:  Alert and cooperative. Normal mood and affect.  ASSESSMENT: Oscar Cox is a 45 y.o. male presenting today for follow up of constipation and c/o epigastric pain x1 year.  Constipation managed with dietary modification, adding in more fiber. Reports a BM every 1-2 days which he states feels sufficient for him. Not doing miralax or benefiber because he could not remember to take it. We discussed continuing high fiber diet, adding fruits/veggies such as prunes and kiwi and increasing water intake. Can try miralax again if dietary mods stop working.  Patient also reporting postprandial epigastric pain x1 year, usually after heavy/greasy meals, denies any nausea or vomiting associated with pain. Denies pain radiation. He has not tried any interventions for the pain. No melena or hematochezia, no weight loss,  early satiety, dysphagia or odynophagia. States that he had some diarrhea that began about 2 weeks ago, last episode was 2 days ago, suspects he had a GI virus at that time. He does however query if epigastric pain is related to his gallbladder, as he has a history of cholelithiasis on liver US in 2021. LFTs on most recent labs 08/22/20 WNL. We discussed referral to general surgery for further evaluation of gallbladder, however, patient declined at this time as he has some family members who are sick and does not really want to proceed with further evaluation since pain is manageable. I have a low suspicion for PUD, given patient's lack of red flag symptoms, lack of risk factors and daily PPI therapy. We discussed that fever, severe RUQ pain, or vomiting that does not subside would require further investigation of pain and would be indications for him to go to the ER. We also discussed that melena, hematochezia would be indications of possible PUD and he should let us know if these occur.   PLAN:  Continue eating high fiber diet with increased H2O intake, can add miralax or benefiber in as needed 2. Plan for repeat colonoscopy in 2027 3. PCP to continue to manage GERD 4. Patient to give [place] a call if he decides he would like to be evaluated by gen surg for hx of cholelithiasis  Follow up 6 months  Case discussed with Dr. Levon Hedger who is in agreement with plan of care, as outlined above.   Bennie Chirico L. Jeanmarie Hubert, MSN, APRN, AGNP-C Adult-Gerontology Nurse Practitioner [facility]

## 2020-09-04 NOTE — Patient Instructions (Signed)
-  You can continue to eat a high fiber diet to help with constipation, as well as drinking plenty of water. -Please let us know if your abdominal pain becomes worse and you would like [place] to refer you to a surgeon for further evaluation. -If you start to have severe Right upper abdominal pain, fever, or vomiting that does not subside, you need to go to the ER as this could indicate infection/inflammation in your gallbladder that would require surgery. -we will plan to repeat your colonoscopy in 2027  Follow up as needed.

## 2020-09-05 ENCOUNTER — Ambulatory Visit (INDEPENDENT_AMBULATORY_CARE_PROVIDER_SITE_OTHER): Payer: Medicaid Other | Admitting: Podiatry

## 2020-09-05 ENCOUNTER — Ambulatory Visit (INDEPENDENT_AMBULATORY_CARE_PROVIDER_SITE_OTHER): Payer: Medicaid Other

## 2020-09-05 ENCOUNTER — Other Ambulatory Visit: Payer: Self-pay

## 2020-09-05 DIAGNOSIS — M7751 Other enthesopathy of right foot: Secondary | ICD-10-CM

## 2020-09-05 DIAGNOSIS — M7752 Other enthesopathy of left foot: Secondary | ICD-10-CM | POA: Diagnosis not present

## 2020-09-05 MED ORDER — OXYCODONE-ACETAMINOPHEN 5-325 MG PO TABS: 1.0000 | ORAL_TABLET | ORAL | 0 refills | Status: DC | PRN

## 2020-09-05 NOTE — Progress Notes (Signed)
  Subjective:  Patient ID: Oscar Cox, male    DOB: 1975/07/03,  MRN: [id number]  Chief Complaint  Patient presents with   Routine Post Op    PT stated that he is doing well he has no concerns at this time    DOS: 08/30/20 Procedure: 5th met head excision right  45 y.o. male presents with the above complaint. History confirmed with patient.  Objective:  Physical Exam: tenderness at the surgical site, local edema noted, and calf supple, nontender. Incision: healing well, no significant drainage, no dehiscence, no significant erythema  No images are attached to the encounter.  Radiographs: X-ray of the right foot: consistent with post-op state, interval resection of 5th metatarsal head  Assessment:   1. Capsulitis of metatarsophalangeal (MTP) joints of both feet    Plan:  Patient was evaluated and treated and all questions answered.  Post-operative State -XR reviewed with patient -Ok to start showering at this time. Advised they cannot soak. -Dressing applied consisting of sterile gauze, kerlix, and ACE bandage -WBAT in CAM boot -Pain medication refilled -XRs needed at follow-up: none  No follow-ups on file.

## 2020-09-12 ENCOUNTER — Encounter: Payer: Medicaid Other | Admitting: Podiatry

## 2020-09-19 ENCOUNTER — Other Ambulatory Visit: Payer: Self-pay

## 2020-09-19 ENCOUNTER — Ambulatory Visit (INDEPENDENT_AMBULATORY_CARE_PROVIDER_SITE_OTHER): Payer: Medicaid Other | Admitting: Podiatry

## 2020-09-19 DIAGNOSIS — Z9889 Other specified postprocedural states: Secondary | ICD-10-CM

## 2020-09-19 DIAGNOSIS — M7752 Other enthesopathy of left foot: Secondary | ICD-10-CM

## 2020-09-19 DIAGNOSIS — M7751 Other enthesopathy of right foot: Secondary | ICD-10-CM

## 2020-09-20 ENCOUNTER — Other Ambulatory Visit: Payer: Self-pay | Admitting: Family Medicine

## 2020-09-20 DIAGNOSIS — J302 Other seasonal allergic rhinitis: Secondary | ICD-10-CM

## 2020-09-20 DIAGNOSIS — E1140 Type 2 diabetes mellitus with diabetic neuropathy, unspecified: Secondary | ICD-10-CM

## 2020-09-25 NOTE — Progress Notes (Signed)
  Subjective:  Patient ID: Oscar Cox, male    DOB: 1975-08-08,  MRN: [id number]  Chief Complaint  Patient presents with   Routine Post Op    POV #2 DOS 08/30/2020 RT Haivana Nakya. Pt states he has been having some pain but overall has been doing well. Ready to get out of the [employer].    DOS: 08/30/20 Procedure: 5th met head excision right  45 y.o. male presents with the above complaint. History confirmed with patient.  Objective:  Physical Exam: tenderness at the surgical site, local edema noted, and calf supple, nontender. Incision: Appears healed  No images are attached to the encounter.  Radiographs: X-ray of the right foot: consistent with post-op state, interval resection of 5th metatarsal head  Assessment:   1. Capsulitis of metatarsophalangeal (MTP) joints of both feet   2. Post-operative state     Plan:  Patient was evaluated and treated and all questions answered.  Post-operative State -Sutures removed -Steri-strips applied to the incision -Ok to start showering at this time. Advised they cannot soak. -WBAT in CAM boot.  Offered surgical shoe advised noncoverage.  Declined -F/u in 1 month for recheck -XRs needed at follow-up: none  No follow-ups on file.

## 2020-10-13 ENCOUNTER — Ambulatory Visit: Payer: Medicaid Other | Admitting: Podiatry

## 2020-10-13 ENCOUNTER — Other Ambulatory Visit: Payer: Self-pay

## 2020-10-13 DIAGNOSIS — M7751 Other enthesopathy of right foot: Secondary | ICD-10-CM

## 2020-10-13 DIAGNOSIS — Z9889 Other specified postprocedural states: Secondary | ICD-10-CM

## 2020-10-13 DIAGNOSIS — M7752 Other enthesopathy of left foot: Secondary | ICD-10-CM

## 2020-10-13 NOTE — Progress Notes (Signed)
  Subjective:  Patient ID: Oscar Cox, male    DOB: 1975-10-06,  MRN: [id number]  Chief Complaint  Patient presents with   Routine Post Op    POV Pt states healing well without any concerns, occasional blood drainage.   DOS: 08/30/20 Procedure: 5th met head excision right  45 y.o. male presents with the above complaint. History confirmed with patient. He is doing very well, ambulating in normal shoegear without pain or discomfort. Does have some diabetic nerve pain which is unchanged from baseline.  Objective:  Physical Exam: tenderness at the surgical site, local edema noted, and calf supple, nontender. Incision: small area of fibrotic wound with incomplete healing. Superficial, approx 0.5 cm diameter without active warmth, erythema, SOI.  Assessment:   1. Capsulitis of metatarsophalangeal (MTP) joints of both feet   2. Post-operative state    Plan:  Patient was evaluated and treated and all questions answered.  Post-operative State -Wound with small residual area of incomplete healing. The mild fibrosis was sharply debrided. It remains without signs of infection -Continue abx ointment and band-aid until healed. -Gently debrided callus submet 5. -F/u in 1 month for recheck -XRs needed at follow-up: none  Return in about 1 month (around 11/12/2020).

## 2020-10-16 ENCOUNTER — Other Ambulatory Visit: Payer: Self-pay | Admitting: Family Medicine

## 2020-10-16 DIAGNOSIS — E782 Mixed hyperlipidemia: Secondary | ICD-10-CM

## 2020-10-16 DIAGNOSIS — E1140 Type 2 diabetes mellitus with diabetic neuropathy, unspecified: Secondary | ICD-10-CM

## 2020-10-20 ENCOUNTER — Ambulatory Visit: Payer: Medicaid Other | Admitting: Podiatry

## 2020-11-07 ENCOUNTER — Other Ambulatory Visit: Payer: Self-pay | Admitting: Family Medicine

## 2020-11-07 DIAGNOSIS — E1140 Type 2 diabetes mellitus with diabetic neuropathy, unspecified: Secondary | ICD-10-CM

## 2020-11-14 ENCOUNTER — Ambulatory Visit: Payer: Medicaid Other | Admitting: Podiatry

## 2020-11-14 ENCOUNTER — Other Ambulatory Visit: Payer: Self-pay

## 2020-11-14 DIAGNOSIS — M7751 Other enthesopathy of right foot: Secondary | ICD-10-CM

## 2020-11-14 DIAGNOSIS — Z9889 Other specified postprocedural states: Secondary | ICD-10-CM

## 2020-11-14 DIAGNOSIS — M7752 Other enthesopathy of left foot: Secondary | ICD-10-CM

## 2020-11-14 NOTE — Progress Notes (Signed)
  Subjective:  Patient ID: Oscar Cox, male    DOB: 1975/06/09,  MRN: [id number]  Chief Complaint  Patient presents with   Routine Post Op    1 month follow up right 5th head excision. Pt states he is healing great. No questions or concerns.    DOS: 08/30/20 Procedure: 5th met head excision right  44 y.o. male presents with the above complaint. History confirmed with patient. Pleased with results of surgery denies post-op issues or concerns. Would like to discuss the same procedure for his left foot when able to do so.  Objective:  Physical Exam: no tenderness at the surgical site and no edema noted. Callus resolving Incision: wound healed  Assessment:   1. Capsulitis of metatarsophalangeal (MTP) joints of both feet   2. Post-operative state     Plan:  Patient was evaluated and treated and all questions answered.  Post-operative State -Wound now healed. Callus resolving plantar foot. Pleased with surgical outcome. -Will have patient f/u in 1 month for discussion of surgical intervention of the left foot.  Return in about 1 month (around 12/14/2020) for Surgical planning visit left foot.

## 2020-11-16 DIAGNOSIS — K3189 Other diseases of stomach and duodenum: Secondary | ICD-10-CM | POA: Diagnosis not present

## 2020-11-16 DIAGNOSIS — K8020 Calculus of gallbladder without cholecystitis without obstruction: Secondary | ICD-10-CM | POA: Diagnosis not present

## 2020-11-16 DIAGNOSIS — S0990XA Unspecified injury of head, initial encounter: Secondary | ICD-10-CM | POA: Diagnosis not present

## 2020-11-16 DIAGNOSIS — M4602 Spinal enthesopathy, cervical region: Secondary | ICD-10-CM | POA: Diagnosis not present

## 2020-11-16 DIAGNOSIS — I6529 Occlusion and stenosis of unspecified carotid artery: Secondary | ICD-10-CM | POA: Diagnosis not present

## 2020-11-16 DIAGNOSIS — I878 Other specified disorders of veins: Secondary | ICD-10-CM | POA: Diagnosis not present

## 2020-11-16 DIAGNOSIS — I2510 Atherosclerotic heart disease of native coronary artery without angina pectoris: Secondary | ICD-10-CM | POA: Diagnosis not present

## 2020-11-16 DIAGNOSIS — Z041 Encounter for examination and observation following transport accident: Secondary | ICD-10-CM | POA: Diagnosis not present

## 2020-11-16 DIAGNOSIS — M419 Scoliosis, unspecified: Secondary | ICD-10-CM | POA: Diagnosis not present

## 2020-11-16 DIAGNOSIS — J9811 Atelectasis: Secondary | ICD-10-CM | POA: Diagnosis not present

## 2020-11-20 ENCOUNTER — Other Ambulatory Visit: Payer: Self-pay | Admitting: Family Medicine

## 2020-11-20 DIAGNOSIS — K219 Gastro-esophageal reflux disease without esophagitis: Secondary | ICD-10-CM

## 2020-12-19 ENCOUNTER — Other Ambulatory Visit: Payer: Self-pay

## 2020-12-19 ENCOUNTER — Ambulatory Visit: Payer: Medicaid Other | Admitting: Podiatry

## 2020-12-19 DIAGNOSIS — M7751 Other enthesopathy of right foot: Secondary | ICD-10-CM | POA: Diagnosis not present

## 2020-12-19 DIAGNOSIS — M21962 Unspecified acquired deformity of left lower leg: Secondary | ICD-10-CM

## 2020-12-19 DIAGNOSIS — M7752 Other enthesopathy of left foot: Secondary | ICD-10-CM | POA: Diagnosis not present

## 2020-12-19 NOTE — Progress Notes (Signed)
  Subjective:  Patient ID: Oscar Cox, male    DOB: [birth year],  MRN: [id number]  Chief Complaint  Patient presents with   surgery consult    F/U Lt foot surgery consultation   DOS: 08/30/20 Procedure: 5th met head excision right  45 y.o. male presents with the above complaint. History confirmed with patient. No issues or pain with right foot would like to proceed with left foot as soon as possible.  Objective:  Physical Exam: no tenderness at the surgical site and no edema noted. Callus resolved Incision: wound healed  POP left 5th MPJ with large callus submet 5  Assessment:   1. Capsulitis of metatarsophalangeal (MTP) joints of both feet   2. Metatarsal deformity, left    Plan:  Patient was evaluated and treated and all questions answered.  Post-operative State -Remains healed pleased with results  Capsulitis 5th MPJ left foot -Discussed proceeding as previously discussed. Discussed procedure. Given opportunity to ask questions. -Patient has failed all conservative therapy and wishes to proceed with surgical intervention. All risks, benefits, and alternatives discussed with patient. No guarantees given. Consent reviewed and signed by patient. -Planned procedures: left 5th metatarsal head excision  Return for Post-op Care.

## 2021-01-02 ENCOUNTER — Encounter: Payer: Medicaid Other | Admitting: Podiatry

## 2021-01-11 ENCOUNTER — Other Ambulatory Visit: Payer: Self-pay | Admitting: Family Medicine

## 2021-01-11 DIAGNOSIS — E782 Mixed hyperlipidemia: Secondary | ICD-10-CM

## 2021-01-11 DIAGNOSIS — J302 Other seasonal allergic rhinitis: Secondary | ICD-10-CM

## 2021-01-11 DIAGNOSIS — E1140 Type 2 diabetes mellitus with diabetic neuropathy, unspecified: Secondary | ICD-10-CM

## 2021-01-16 ENCOUNTER — Encounter: Payer: Medicaid Other | Admitting: Podiatry

## 2021-01-16 ENCOUNTER — Telehealth: Payer: Self-pay | Admitting: Urology

## 2021-01-16 NOTE — Telephone Encounter (Signed)
DOS - 01/24/21   MET HEAD RESEC 5TH LEFT --- 28113  HEALTHY BLUE EFFECTIVE DATE - 07/15/19   PER [employer] WEBSITE FOR CPT CODE 58446 HAS BEEN APPROVED, AUTH # [id number], GOOD FROM 01/24/21 - 02/23/21.  REF # [id number]

## 2021-01-23 ENCOUNTER — Other Ambulatory Visit (INDEPENDENT_AMBULATORY_CARE_PROVIDER_SITE_OTHER): Payer: Medicaid Other | Admitting: Podiatry

## 2021-01-23 MED ORDER — CEPHALEXIN 500 MG PO CAPS: 500.0000 mg | ORAL_CAPSULE | Freq: Two times a day (BID) | ORAL | 0 refills | Status: DC

## 2021-01-23 MED ORDER — OXYCODONE-ACETAMINOPHEN 5-325 MG PO TABS: 1.0000 | ORAL_TABLET | ORAL | 0 refills | Status: DC | PRN

## 2021-01-23 NOTE — Progress Notes (Signed)
Called for pre-op call. Not at home. Advised his family that rx was sent to [facility] in [place].

## 2021-01-24 DIAGNOSIS — M7742 Metatarsalgia, left foot: Secondary | ICD-10-CM | POA: Diagnosis not present

## 2021-01-24 DIAGNOSIS — M21962 Unspecified acquired deformity of left lower leg: Secondary | ICD-10-CM | POA: Diagnosis not present

## 2021-01-30 ENCOUNTER — Ambulatory Visit (INDEPENDENT_AMBULATORY_CARE_PROVIDER_SITE_OTHER): Payer: Medicaid Other | Admitting: Podiatry

## 2021-01-30 ENCOUNTER — Ambulatory Visit (INDEPENDENT_AMBULATORY_CARE_PROVIDER_SITE_OTHER): Payer: Medicaid Other

## 2021-01-30 ENCOUNTER — Other Ambulatory Visit: Payer: Self-pay

## 2021-01-30 DIAGNOSIS — Z9889 Other specified postprocedural states: Secondary | ICD-10-CM | POA: Diagnosis not present

## 2021-01-30 DIAGNOSIS — M7751 Other enthesopathy of right foot: Secondary | ICD-10-CM

## 2021-01-30 DIAGNOSIS — M21962 Unspecified acquired deformity of left lower leg: Secondary | ICD-10-CM

## 2021-01-30 DIAGNOSIS — M7752 Other enthesopathy of left foot: Secondary | ICD-10-CM

## 2021-01-30 MED ORDER — OXYCODONE-ACETAMINOPHEN 5-325 MG PO TABS: 1.0000 | ORAL_TABLET | ORAL | 0 refills | Status: DC | PRN

## 2021-01-30 NOTE — Progress Notes (Signed)
Subjective:  Patient ID: Oscar Cox, male    DOB: 09/08/1975,  MRN: [id number]  Chief Complaint  Patient presents with   Routine Post Op    Patient is here for a follow up. Patient had surgery last Wed. 6/10 pain level.    DOS: 01/24/21 Procedure: Excision left 5th Metatarsal head   46 y.o. male presents with the above complaint. History confirmed with patient. Doing well. Requests care of his pain medication today. Denies N/V/F/CH/SOB.   Objective:  Physical Exam: tenderness at the surgical site, local edema noted, and calf supple, nontender. Incision: healing well, no significant drainage, no dehiscence, no significant erythema  No images are attached to the encounter.  Radiographs: X-ray of the left foot: interval 5th met head excision c/w post-op state  Assessment:   1. Status post left foot surgery   2. Capsulitis of metatarsophalangeal (MTP) joints of both feet   3. Metatarsal deformity, left     Plan:  Patient was evaluated and treated and all questions answered.  Post-operative State -XR reviewed with patient -Dressing applied consisting of sterile gauze, kerlix, and ACE bandage -WBAT in CAM boot -Pain medication refilled -XRs needed at follow-up: none   Return in about 2 weeks (around 02/13/2021) for Post-Op (No XRs).

## 2021-02-07 ENCOUNTER — Other Ambulatory Visit: Payer: Self-pay | Admitting: Family Medicine

## 2021-02-07 DIAGNOSIS — J302 Other seasonal allergic rhinitis: Secondary | ICD-10-CM

## 2021-02-08 ENCOUNTER — Other Ambulatory Visit: Payer: Self-pay | Admitting: Family Medicine

## 2021-02-08 DIAGNOSIS — I10 Essential (primary) hypertension: Secondary | ICD-10-CM

## 2021-02-13 ENCOUNTER — Other Ambulatory Visit: Payer: Self-pay

## 2021-02-13 ENCOUNTER — Ambulatory Visit (INDEPENDENT_AMBULATORY_CARE_PROVIDER_SITE_OTHER): Payer: Medicaid Other | Admitting: Podiatry

## 2021-02-13 DIAGNOSIS — M7742 Metatarsalgia, left foot: Secondary | ICD-10-CM | POA: Diagnosis not present

## 2021-02-13 DIAGNOSIS — Z9889 Other specified postprocedural states: Secondary | ICD-10-CM

## 2021-02-13 NOTE — Progress Notes (Signed)
°  Subjective:  Patient ID: JERICK KHACHATRYAN, male    DOB: May 07, 1975,  MRN: [id number]  No chief complaint on file.  DOS: 01/24/21 Procedure: Excision left 5th Metatarsal head   46 y.o. male presents with the above complaint. History confirmed with patient. Doing well, denies pain and N/V/F/Ch.  Objective:  Physical Exam: tenderness at the surgical site, local edema noted, and calf supple, nontender. Incision: healing well, no significant drainage, no dehiscence, no significant erythema   Assessment:   1. Status post left foot surgery     Plan:  Patient was evaluated and treated and all questions answered.  Post-operative State -Sutures removed -Steri-strips applied to the incision -Ok to start showering at this time. Advised they cannot soak. -WBAT in CAM boot -Transition out of CAM boot in 1 week -XRs needed at follow-up: none   Return in about 3 weeks (around 03/06/2021) for pov# 3 with XRs.

## 2021-02-15 ENCOUNTER — Other Ambulatory Visit: Payer: Self-pay | Admitting: Family Medicine

## 2021-02-15 DIAGNOSIS — K219 Gastro-esophageal reflux disease without esophagitis: Secondary | ICD-10-CM

## 2021-02-22 ENCOUNTER — Ambulatory Visit: Payer: Medicaid Other | Admitting: Family Medicine

## 2021-02-22 ENCOUNTER — Encounter: Payer: Self-pay | Admitting: Family Medicine

## 2021-02-22 VITALS — BP 111/76 | HR 99 | Temp 97.9°F | Ht 73.0 in | Wt 191.2 lb

## 2021-02-22 DIAGNOSIS — M5412 Radiculopathy, cervical region: Secondary | ICD-10-CM

## 2021-02-22 DIAGNOSIS — E1149 Type 2 diabetes mellitus with other diabetic neurological complication: Secondary | ICD-10-CM | POA: Diagnosis not present

## 2021-02-22 DIAGNOSIS — E782 Mixed hyperlipidemia: Secondary | ICD-10-CM | POA: Diagnosis not present

## 2021-02-22 DIAGNOSIS — M4802 Spinal stenosis, cervical region: Secondary | ICD-10-CM

## 2021-02-22 DIAGNOSIS — E1140 Type 2 diabetes mellitus with diabetic neuropathy, unspecified: Secondary | ICD-10-CM

## 2021-02-22 DIAGNOSIS — Z0001 Encounter for general adult medical examination with abnormal findings: Secondary | ICD-10-CM | POA: Diagnosis not present

## 2021-02-22 DIAGNOSIS — J302 Other seasonal allergic rhinitis: Secondary | ICD-10-CM

## 2021-02-22 DIAGNOSIS — I10 Essential (primary) hypertension: Secondary | ICD-10-CM | POA: Diagnosis not present

## 2021-02-22 DIAGNOSIS — Z0000 Encounter for general adult medical examination without abnormal findings: Secondary | ICD-10-CM

## 2021-02-22 LAB — BAYER DCA HB A1C WAIVED: HB A1C (BAYER DCA - WAIVED): 5.6 % (ref 4.8–5.6)

## 2021-02-22 LAB — LIPID PANEL

## 2021-02-22 LAB — HM DIABETES EYE EXAM

## 2021-02-22 MED ORDER — PREDNISONE 10 MG (21) PO TBPK: ORAL_TABLET | ORAL | 0 refills | Status: DC

## 2021-02-22 MED ORDER — PRAVASTATIN SODIUM 40 MG PO TABS: 40.0000 mg | ORAL_TABLET | Freq: Every day | ORAL | 1 refills | Status: DC

## 2021-02-22 MED ORDER — MONTELUKAST SODIUM 10 MG PO TABS: 10.0000 mg | ORAL_TABLET | Freq: Every day | ORAL | 1 refills | Status: DC

## 2021-02-22 MED ORDER — LISINOPRIL 20 MG PO TABS: 20.0000 mg | ORAL_TABLET | Freq: Every day | ORAL | 1 refills | Status: DC

## 2021-02-22 MED ORDER — GABAPENTIN 400 MG PO CAPS: 400.0000 mg | ORAL_CAPSULE | Freq: Three times a day (TID) | ORAL | 5 refills | Status: DC
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
## 2021-02-22 NOTE — Patient Instructions (Addendum)
You may stop your glipizide.

## 2021-02-23 LAB — LIPID PANEL
Chol/HDL Ratio: 2.7 ratio (ref 0.0–5.0)
Cholesterol, Total: 120 mg/dL (ref 100–199)
HDL: 44 mg/dL (ref >39)
LDL Chol Calc (NIH): 59 mg/dL (ref 0–99)
Triglycerides: 88 mg/dL (ref 0–149)
VLDL Cholesterol Cal: 17 mg/dL (ref 5–40)

## 2021-02-23 LAB — CMP14+EGFR
ALT: 21 IU/L (ref 0–44)
AST: 20 IU/L (ref 0–40)
Albumin/Globulin Ratio: 2.2 (ref 1.2–2.2)
Albumin: 4.8 g/dL (ref 4.0–5.0)
Alkaline Phosphatase: 62 IU/L (ref 44–121)
BUN/Creatinine Ratio: 18 (ref 9–20)
BUN: 15 mg/dL (ref 6–24)
Bilirubin Total: 0.3 mg/dL (ref 0.0–1.2)
CO2: 24 mmol/L (ref 20–29)
Calcium: 9.7 mg/dL (ref 8.7–10.2)
Chloride: 101 mmol/L (ref 96–106)
Creatinine, Ser: 0.83 mg/dL (ref 0.76–1.27)
Globulin, Total: 2.2 g/dL (ref 1.5–4.5)
Glucose: 86 mg/dL (ref 70–99)
Potassium: 4.3 mmol/L (ref 3.5–5.2)
Sodium: 138 mmol/L (ref 134–144)
Total Protein: 7 g/dL (ref 6.0–8.5)
eGFR: 110 mL/min/{1.73_m2} (ref >59)

## 2021-02-23 LAB — CBC WITH DIFFERENTIAL/PLATELET
Basophils Absolute: 0 10*3/uL (ref 0.0–0.2)
Basos: 0 %
EOS (ABSOLUTE): 0.4 10*3/uL (ref 0.0–0.4)
Eos: 4 %
Hematocrit: 46.3 % (ref 37.5–51.0)
Hemoglobin: 15.4 g/dL (ref 13.0–17.7)
Immature Grans (Abs): 0 10*3/uL (ref 0.0–0.1)
Immature Granulocytes: 0 %
Lymphocytes Absolute: 2.8 10*3/uL (ref 0.7–3.1)
Lymphs: 25 %
MCH: 30.3 pg (ref 26.6–33.0)
MCHC: 33.3 g/dL (ref 31.5–35.7)
MCV: 91 fL (ref 79–97)
Monocytes Absolute: 0.7 10*3/uL (ref 0.1–0.9)
Monocytes: 6 %
Neutrophils Absolute: 7.4 10*3/uL — ABNORMAL HIGH (ref 1.4–7.0)
Neutrophils: 65 %
Platelets: 247 10*3/uL (ref 150–450)
RBC: 5.08 x10E6/uL (ref 4.14–5.80)
RDW: 12.6 % (ref 11.6–15.4)
WBC: 11.4 10*3/uL — ABNORMAL HIGH (ref 3.4–10.8)

## 2021-02-23 LAB — MICROALBUMIN / CREATININE URINE RATIO
Creatinine, Urine: 116.6 mg/dL
Microalb/Creat Ratio: 5 mg/g creat (ref 0–29)
Microalbumin, Urine: 6.3 ug/mL

## 2021-02-25 ENCOUNTER — Encounter: Payer: Self-pay | Admitting: Family Medicine

## 2021-03-06 ENCOUNTER — Encounter: Payer: Medicaid Other | Admitting: Podiatry

## 2021-03-07 ENCOUNTER — Ambulatory Visit (INDEPENDENT_AMBULATORY_CARE_PROVIDER_SITE_OTHER): Payer: Medicaid Other

## 2021-03-07 ENCOUNTER — Other Ambulatory Visit: Payer: Self-pay

## 2021-03-07 ENCOUNTER — Ambulatory Visit (INDEPENDENT_AMBULATORY_CARE_PROVIDER_SITE_OTHER): Payer: Medicaid Other | Admitting: Podiatry

## 2021-03-07 DIAGNOSIS — Z9889 Other specified postprocedural states: Secondary | ICD-10-CM

## 2021-03-07 DIAGNOSIS — M21961 Unspecified acquired deformity of right lower leg: Secondary | ICD-10-CM

## 2021-03-08 ENCOUNTER — Other Ambulatory Visit: Payer: Self-pay | Admitting: Family Medicine

## 2021-03-08 DIAGNOSIS — J302 Other seasonal allergic rhinitis: Secondary | ICD-10-CM

## 2021-03-09 NOTE — Progress Notes (Signed)
°  Subjective:  Patient ID: Oscar Cox, male    DOB: 1975/03/18,  MRN: [id number]  No chief complaint on file.  DOS: 01/24/21 Procedure: Excision left 5th Metatarsal head   46 y.o. male presents with the above complaint. History confirmed with patient. Doing well, denies pain and N/V/F/Ch.  Objective:  Physical Exam: tenderness at the surgical site, local edema noted, and calf supple, nontender. Incision: healing well, no significant drainage, no dehiscence, no significant erythema   Assessment:   1. Status post left foot surgery   2. Metatarsal deformity, right     Plan:  Patient was evaluated and treated and all questions answered.  Post-operative State -Doing well.  Patient is a limited breakdown of the skin ulceration to the lateral aspect of the fifth.  I encouraged him to do Betadine wet-to-dry dressing for now. -Transition out of CAM boot in 1 week -XRs needed at follow-up: none   No follow-ups on file.

## 2021-03-14 DIAGNOSIS — Z6825 Body mass index (BMI) 25.0-25.9, adult: Secondary | ICD-10-CM | POA: Diagnosis not present

## 2021-03-14 DIAGNOSIS — M5412 Radiculopathy, cervical region: Secondary | ICD-10-CM | POA: Diagnosis not present

## 2021-03-20 DIAGNOSIS — M4802 Spinal stenosis, cervical region: Secondary | ICD-10-CM | POA: Diagnosis not present

## 2021-03-20 DIAGNOSIS — M5412 Radiculopathy, cervical region: Secondary | ICD-10-CM | POA: Diagnosis not present

## 2021-03-20 DIAGNOSIS — M542 Cervicalgia: Secondary | ICD-10-CM | POA: Diagnosis not present

## 2021-03-20 DIAGNOSIS — M2578 Osteophyte, vertebrae: Secondary | ICD-10-CM | POA: Diagnosis not present

## 2021-03-28 ENCOUNTER — Encounter: Payer: Medicaid Other | Admitting: Podiatry

## 2021-03-29 DIAGNOSIS — M5412 Radiculopathy, cervical region: Secondary | ICD-10-CM | POA: Diagnosis not present

## 2021-04-07 ENCOUNTER — Other Ambulatory Visit: Payer: Self-pay | Admitting: Family Medicine

## 2021-04-07 DIAGNOSIS — J302 Other seasonal allergic rhinitis: Secondary | ICD-10-CM

## 2021-05-01 ENCOUNTER — Other Ambulatory Visit: Payer: Self-pay | Admitting: Family Medicine

## 2021-05-01 DIAGNOSIS — E1140 Type 2 diabetes mellitus with diabetic neuropathy, unspecified: Secondary | ICD-10-CM

## 2021-05-11 ENCOUNTER — Other Ambulatory Visit: Payer: Self-pay | Admitting: Family Medicine

## 2021-05-11 DIAGNOSIS — K219 Gastro-esophageal reflux disease without esophagitis: Secondary | ICD-10-CM

## 2021-05-23 ENCOUNTER — Ambulatory Visit: Payer: Medicaid Other | Admitting: Family Medicine

## 2021-05-23 ENCOUNTER — Encounter: Payer: Self-pay | Admitting: Family Medicine

## 2021-05-23 VITALS — BP 120/81 | HR 90 | Temp 97.4°F | Ht 73.0 in | Wt 190.4 lb

## 2021-05-23 DIAGNOSIS — E1140 Type 2 diabetes mellitus with diabetic neuropathy, unspecified: Secondary | ICD-10-CM | POA: Diagnosis not present

## 2021-05-23 DIAGNOSIS — I10 Essential (primary) hypertension: Secondary | ICD-10-CM | POA: Diagnosis not present

## 2021-05-23 DIAGNOSIS — E782 Mixed hyperlipidemia: Secondary | ICD-10-CM

## 2021-05-23 DIAGNOSIS — M4802 Spinal stenosis, cervical region: Secondary | ICD-10-CM | POA: Diagnosis not present

## 2021-05-23 DIAGNOSIS — M5412 Radiculopathy, cervical region: Secondary | ICD-10-CM

## 2021-05-23 DIAGNOSIS — M542 Cervicalgia: Secondary | ICD-10-CM

## 2021-05-23 LAB — BAYER DCA HB A1C WAIVED: HB A1C (BAYER DCA - WAIVED): 6.2 % — ABNORMAL HIGH (ref 4.8–5.6)
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
## 2021-05-24 LAB — CBC WITH DIFFERENTIAL/PLATELET
Basophils Absolute: 0.1 10*3/uL (ref 0.0–0.2)
Basos: 1 %
EOS (ABSOLUTE): 0.4 10*3/uL (ref 0.0–0.4)
Eos: 4 %
Hematocrit: 47.5 % (ref 37.5–51.0)
Hemoglobin: 16.5 g/dL (ref 13.0–17.7)
Immature Grans (Abs): 0 10*3/uL (ref 0.0–0.1)
Immature Granulocytes: 0 %
Lymphocytes Absolute: 2.3 10*3/uL (ref 0.7–3.1)
Lymphs: 23 %
MCH: 31.5 pg (ref 26.6–33.0)
MCHC: 34.7 g/dL (ref 31.5–35.7)
MCV: 91 fL (ref 79–97)
Monocytes Absolute: 0.7 10*3/uL (ref 0.1–0.9)
Monocytes: 7 %
Neutrophils Absolute: 6.4 10*3/uL (ref 1.4–7.0)
Neutrophils: 65 %
Platelets: 224 10*3/uL (ref 150–450)
RBC: 5.24 x10E6/uL (ref 4.14–5.80)
RDW: 12.3 % (ref 11.6–15.4)
WBC: 9.8 10*3/uL (ref 3.4–10.8)

## 2021-05-24 LAB — CMP14+EGFR
ALT: 16 IU/L (ref 0–44)
AST: 15 IU/L (ref 0–40)
Albumin/Globulin Ratio: 2 (ref 1.2–2.2)
Albumin: 4.6 g/dL (ref 4.0–5.0)
Alkaline Phosphatase: 70 IU/L (ref 44–121)
BUN/Creatinine Ratio: 13 (ref 9–20)
BUN: 14 mg/dL (ref 6–24)
Bilirubin Total: 0.4 mg/dL (ref 0.0–1.2)
CO2: 25 mmol/L (ref 20–29)
Calcium: 9.7 mg/dL (ref 8.7–10.2)
Chloride: 97 mmol/L (ref 96–106)
Creatinine, Ser: 1.04 mg/dL (ref 0.76–1.27)
Globulin, Total: 2.3 g/dL (ref 1.5–4.5)
Glucose: 176 mg/dL — ABNORMAL HIGH (ref 70–99)
Potassium: 4 mmol/L (ref 3.5–5.2)
Sodium: 136 mmol/L (ref 134–144)
Total Protein: 6.9 g/dL (ref 6.0–8.5)
eGFR: 90 mL/min/{1.73_m2} (ref >59)

## 2021-05-24 LAB — LIPID PANEL
Chol/HDL Ratio: 2.9 ratio (ref 0.0–5.0)
Cholesterol, Total: 132 mg/dL (ref 100–199)
HDL: 46 mg/dL (ref >39)
LDL Chol Calc (NIH): 66 mg/dL (ref 0–99)
Triglycerides: 108 mg/dL (ref 0–149)
VLDL Cholesterol Cal: 20 mg/dL (ref 5–40)

## 2021-05-24 LAB — PSA, TOTAL AND FREE
PSA, Free Pct: 25 %
PSA, Free: 0.1 ng/mL
Prostate Specific Ag, Serum: 0.4 ng/mL (ref 0.0–4.0)

## 2021-07-23 ENCOUNTER — Other Ambulatory Visit: Payer: Self-pay | Admitting: Family Medicine

## 2021-07-23 DIAGNOSIS — E1140 Type 2 diabetes mellitus with diabetic neuropathy, unspecified: Secondary | ICD-10-CM

## 2021-08-01 ENCOUNTER — Other Ambulatory Visit: Payer: Self-pay | Admitting: Family Medicine

## 2021-08-01 DIAGNOSIS — K219 Gastro-esophageal reflux disease without esophagitis: Secondary | ICD-10-CM

## 2021-08-22 ENCOUNTER — Encounter: Payer: Self-pay | Admitting: *Deleted

## 2021-08-25 ENCOUNTER — Other Ambulatory Visit: Payer: Self-pay | Admitting: Family Medicine

## 2021-08-25 DIAGNOSIS — J302 Other seasonal allergic rhinitis: Secondary | ICD-10-CM

## 2021-09-12 ENCOUNTER — Other Ambulatory Visit: Payer: Self-pay | Admitting: Family Medicine

## 2021-09-12 DIAGNOSIS — J302 Other seasonal allergic rhinitis: Secondary | ICD-10-CM

## 2021-09-25 ENCOUNTER — Other Ambulatory Visit: Payer: Self-pay | Admitting: Family Medicine

## 2021-09-25 DIAGNOSIS — E782 Mixed hyperlipidemia: Secondary | ICD-10-CM

## 2021-09-25 DIAGNOSIS — E1140 Type 2 diabetes mellitus with diabetic neuropathy, unspecified: Secondary | ICD-10-CM

## 2021-10-17 ENCOUNTER — Other Ambulatory Visit: Payer: Self-pay | Admitting: Family Medicine

## 2021-10-17 DIAGNOSIS — I10 Essential (primary) hypertension: Secondary | ICD-10-CM

## 2021-10-22 ENCOUNTER — Other Ambulatory Visit: Payer: Self-pay | Admitting: Family Medicine

## 2021-10-22 DIAGNOSIS — K219 Gastro-esophageal reflux disease without esophagitis: Secondary | ICD-10-CM

## 2021-11-14 ENCOUNTER — Other Ambulatory Visit: Payer: Self-pay | Admitting: Family Medicine

## 2021-11-14 DIAGNOSIS — J302 Other seasonal allergic rhinitis: Secondary | ICD-10-CM

## 2021-11-15 ENCOUNTER — Other Ambulatory Visit: Payer: Self-pay | Admitting: Family Medicine

## 2021-11-15 DIAGNOSIS — J302 Other seasonal allergic rhinitis: Secondary | ICD-10-CM

## 2021-11-23 ENCOUNTER — Ambulatory Visit: Payer: Medicaid Other | Admitting: Family Medicine

## 2021-11-23 ENCOUNTER — Ambulatory Visit: Payer: Medicaid Other | Admitting: Nurse Practitioner

## 2021-11-23 ENCOUNTER — Encounter: Payer: Self-pay | Admitting: Nurse Practitioner

## 2021-11-23 VITALS — BP 120/79 | HR 104 | Temp 98.6°F | Ht 73.0 in | Wt 191.0 lb

## 2021-11-23 DIAGNOSIS — E1149 Type 2 diabetes mellitus with other diabetic neurological complication: Secondary | ICD-10-CM

## 2021-11-23 DIAGNOSIS — E1140 Type 2 diabetes mellitus with diabetic neuropathy, unspecified: Secondary | ICD-10-CM

## 2021-11-23 DIAGNOSIS — I10 Essential (primary) hypertension: Secondary | ICD-10-CM | POA: Diagnosis not present

## 2021-11-23 DIAGNOSIS — E782 Mixed hyperlipidemia: Secondary | ICD-10-CM | POA: Diagnosis not present

## 2021-11-23 LAB — BAYER DCA HB A1C WAIVED: HB A1C (BAYER DCA - WAIVED): 6.4 % — ABNORMAL HIGH (ref 4.8–5.6)

## 2021-11-23 NOTE — Assessment & Plan Note (Signed)
No changes to current regimen, symptom=ms are well controlled. Follow up in 6 months.

## 2021-11-23 NOTE — Assessment & Plan Note (Signed)
Hypertension well controlled on current regimen. No changes necessary, follow up in 6 months.

## 2021-11-23 NOTE — Assessment & Plan Note (Signed)
Diabetes well controlled. Completed labs, results pending. Continue diabetic diet, weight loss and exercise. Follow up in 6 months.

## 2021-11-23 NOTE — Patient Instructions (Signed)
Dyslipidemia Dyslipidemia is an imbalance of waxy, fat-like substances (lipids) in the blood. The body needs lipids in small amounts. Dyslipidemia often involves a high level of cholesterol or triglycerides, which are types of lipids. Common forms of dyslipidemia include: High levels of LDL cholesterol. LDL is the type of cholesterol that causes fatty deposits (plaques) to build up in the blood vessels that carry blood away from the heart (arteries). Low levels of HDL cholesterol. HDL cholesterol is the type of cholesterol that protects against heart disease. High levels of HDL remove the LDL buildup from arteries. High levels of triglycerides. Triglycerides are a fatty substance in the blood that is linked to a buildup of plaques in the arteries. What are the causes? There are two main types of dyslipidemia: primary and secondary. Primary dyslipidemia is caused by changes (mutations) in genes that are passed down through families (inherited). These mutations cause several types of dyslipidemia. Secondary dyslipidemia may be caused by various risk factors that can lead to the disease, such as lifestyle choices and certain medical conditions. What increases the risk? You are more likely to develop this condition if you are an older man or if you are a woman who has gone through menopause. Other risk factors include: Having a family history of dyslipidemia. Taking certain medicines, including birth control pills, steroids, some diuretics, and beta-blockers. Eating a diet high in saturated fat. Smoking cigarettes or excessive alcohol intake. Having certain medical conditions such as diabetes, polycystic ovary syndrome (PCOS), kidney disease, liver disease, or hypothyroidism. Not exercising regularly. Being overweight or obese with too much belly fat. What are the signs or symptoms? In most cases, dyslipidemia does not usually cause any symptoms. In severe cases, very high lipid levels can  cause: Fatty bumps under the skin (xanthomas). A white or gray ring around the black center (pupil) of the eye. Very high triglyceride levels can cause inflammation of the pancreas (pancreatitis). How is this diagnosed? Your health care provider may diagnose dyslipidemia based on a routine blood test (fasting blood test). Because most people do not have symptoms of the condition, this blood testing (lipid profile) is done on adults age 20 and older and is repeated every 4-6 years. This test checks: Total cholesterol. This measures the total amount of cholesterol in your blood, including LDL cholesterol, HDL cholesterol, and triglycerides. A healthy number is below 200 mg/dL (5.17 mmol/L). LDL cholesterol. The target number for LDL cholesterol is different for each person, depending on individual risk factors. A healthy number is usually below 100 mg/dL (2.59 mmol/L). Ask your health care provider what your LDL cholesterol should be. HDL cholesterol. An HDL level of 60 mg/dL (1.55 mmol/L) or higher is best because it helps to protect against heart disease. A number below 40 mg/dL (1.03 mmol/L) for men or below 50 mg/dL (1.29 mmol/L) for women increases the risk for heart disease. Triglycerides. A healthy triglyceride number is below 150 mg/dL (1.69 mmol/L). If your lipid profile is abnormal, your health care provider may do other blood tests. How is this treated? Treatment depends on the type of dyslipidemia that you have and your other risk factors for heart disease and stroke. Your health care provider will have a target range for your lipid levels based on this information. Treatment for dyslipidemia starts with lifestyle changes, such as diet and exercise. Your health care provider may recommend that you: Get regular exercise. Make changes to your diet. Quit smoking if you smoke. Limit your alcohol intake. If diet   changes and exercise do not help you reach your goals, your health care provider  may also prescribe medicine to lower lipids. The most commonly prescribed type of medicine lowers your LDL cholesterol (statin drug). If you have a high triglyceride level, your provider may prescribe another type of drug (fibrate) or an omega-3 fish oil supplement, or both. Follow these instructions at home: Eating and drinking  Follow instructions from your health care provider or dietitian about eating or drinking restrictions. Eat a healthy diet as told by your health care provider. This can help you reach and maintain a healthy weight, lower your LDL cholesterol, and raise your HDL cholesterol. This may include: Limiting your calories, if you are overweight. Eating more fruits, vegetables, whole grains, fish, and lean meats. Limiting saturated fat, trans fat, and cholesterol. Do not drink alcohol if: Your health care provider tells you not to drink. You are pregnant, may be pregnant, or are planning to become pregnant. If you drink alcohol: Limit how much you have to: 0-1 drink a day for women. 0-2 drinks a day for men. Know how much alcohol is in your drink. In the [place] one drink equals one 12 oz bottle of beer (355 mL), one 5 oz glass of wine (148 mL), or one 1 oz glass of hard liquor (44 mL). Activity Get regular exercise. Start an exercise and strength training program as told by your health care provider. Ask your health care provider what activities are safe for you. Your health care provider may recommend: 30 minutes of aerobic activity 4-6 days a week. Brisk walking is an example of aerobic activity. Strength training 2 days a week. General instructions Do not use any products that contain nicotine or tobacco. These products include cigarettes, chewing tobacco, and vaping devices, such as e-cigarettes. If you need help quitting, ask your health care provider. Take over-the-counter and prescription medicines only as told by your health care provider. This includes  supplements. Keep all follow-up visits. This is important. Contact a health care provider if: You are having trouble sticking to your exercise or diet plan. You are struggling to quit smoking or to control your use of alcohol. Summary Dyslipidemia often involves a high level of cholesterol or triglycerides, which are types of lipids. Treatment depends on the type of dyslipidemia that you have and your other risk factors for heart disease and stroke. Treatment for dyslipidemia starts with lifestyle changes, such as diet and exercise. Your health care provider may prescribe medicine to lower lipids. This information is not intended to replace advice given to you by your health care provider. Make sure you discuss any questions you have with your health care provider. Document Revised: 08/03/2021 Document Reviewed: 03/06/2020 Elsevier Patient Education  2023 [employer] Hypertension, Adult High blood pressure (hypertension) is when the force of blood pumping through the arteries is too strong. The arteries are the blood vessels that carry blood from the heart throughout the body. Hypertension forces the heart to work harder to pump blood and may cause arteries to become narrow or stiff. Untreated or uncontrolled hypertension can lead to a heart attack, heart failure, a stroke, kidney disease, and other problems. A blood pressure reading consists of a higher number over a lower number. Ideally, your blood pressure should be below 120/80. The first ("top") number is called the systolic pressure. It is a measure of the pressure in your arteries as your heart beats. The second ("bottom") number is called the diastolic pressure. It is  a measure of the pressure in your arteries as the heart relaxes. What are the causes? The exact cause of this condition is not known. There are some conditions that result in high blood pressure. What increases the risk? Certain factors may make you more likely to develop  high blood pressure. Some of these risk factors are under your control, including: Smoking. Not getting enough exercise or physical activity. Being overweight. Having too much fat, sugar, calories, or salt (sodium) in your diet. Drinking too much alcohol. Other risk factors include: Having a personal history of heart disease, diabetes, high cholesterol, or kidney disease. Stress. Having a family history of high blood pressure and high cholesterol. Having obstructive sleep apnea. Age. The risk increases with age. What are the signs or symptoms? High blood pressure may not cause symptoms. Very high blood pressure (hypertensive crisis) may cause: Headache. Fast or irregular heartbeats (palpitations). Shortness of breath. Nosebleed. Nausea and vomiting. Vision changes. Severe chest pain, dizziness, and seizures. How is this diagnosed? This condition is diagnosed by measuring your blood pressure while you are seated, with your arm resting on a flat surface, your legs uncrossed, and your feet flat on the floor. The cuff of the blood pressure monitor will be placed directly against the skin of your upper arm at the level of your heart. Blood pressure should be measured at least twice using the same arm. Certain conditions can cause a difference in blood pressure between your right and left arms. If you have a high blood pressure reading during one visit or you have normal blood pressure with other risk factors, you may be asked to: Return on a different day to have your blood pressure checked again. Monitor your blood pressure at home for 1 week or longer. If you are diagnosed with hypertension, you may have other blood or imaging tests to help your health care provider understand your overall risk for other conditions. How is this treated? This condition is treated by making healthy lifestyle changes, such as eating healthy foods, exercising more, and reducing your alcohol intake. You may be  referred for counseling on a healthy diet and physical activity. Your health care provider may prescribe medicine if lifestyle changes are not enough to get your blood pressure under control and if: Your systolic blood pressure is above 130. Your diastolic blood pressure is above 80. Your personal target blood pressure may vary depending on your medical conditions, your age, and other factors. Follow these instructions at home: Eating and drinking  Eat a diet that is high in fiber and potassium, and low in sodium, added sugar, and fat. An example of this eating plan is called the DASH diet. DASH stands for Dietary Approaches to Stop Hypertension. To eat this way: Eat plenty of fresh fruits and vegetables. Try to fill one half of your plate at each meal with fruits and vegetables. Eat whole grains, such as whole-wheat pasta, brown rice, or whole-grain bread. Fill about one fourth of your plate with whole grains. Eat or drink low-fat dairy products, such as skim milk or low-fat yogurt. Avoid fatty cuts of meat, processed or cured meats, and poultry with skin. Fill about one fourth of your plate with lean proteins, such as fish, chicken without skin, beans, eggs, or tofu. Avoid pre-made and processed foods. These tend to be higher in sodium, added sugar, and fat. Reduce your daily sodium intake. Many people with hypertension should eat less than 1,500 mg of sodium a day. Do not  drink alcohol if: Your health care provider tells you not to drink. You are pregnant, may be pregnant, or are planning to become pregnant. If you drink alcohol: Limit how much you have to: 0-1 drink a day for women. 0-2 drinks a day for men. Know how much alcohol is in your drink. In the [place] one drink equals one 12 oz bottle of beer (355 mL), one 5 oz glass of wine (148 mL), or one 1 oz glass of hard liquor (44 mL). Lifestyle  Work with your health care provider to maintain a healthy body weight or to lose weight. Ask  what an ideal weight is for you. Get at least 30 minutes of exercise that causes your heart to beat faster (aerobic exercise) most days of the week. Activities may include walking, swimming, or biking. Include exercise to strengthen your muscles (resistance exercise), such as Pilates or lifting weights, as part of your weekly exercise routine. Try to do these types of exercises for 30 minutes at least 3 days a week. Do not use any products that contain nicotine or tobacco. These products include cigarettes, chewing tobacco, and vaping devices, such as e-cigarettes. If you need help quitting, ask your health care provider. Monitor your blood pressure at home as told by your health care provider. Keep all follow-up visits. This is important. Medicines Take over-the-counter and prescription medicines only as told by your health care provider. Follow directions carefully. Blood pressure medicines must be taken as prescribed. Do not skip doses of blood pressure medicine. Doing this puts you at risk for problems and can make the medicine less effective. Ask your health care provider about side effects or reactions to medicines that you should watch for. Contact a health care provider if you: Think you are having a reaction to a medicine you are taking. Have headaches that keep coming back (recurring). Feel dizzy. Have swelling in your ankles. Have trouble with your vision. Get help right away if you: Develop a severe headache or confusion. Have unusual weakness or numbness. Feel faint. Have severe pain in your chest or abdomen. Vomit repeatedly. Have trouble breathing. These symptoms may be an emergency. Get help right away. Call 911. Do not wait to see if the symptoms will go away. Do not drive yourself to the hospital. Summary Hypertension is when the force of blood pumping through your arteries is too strong. If this condition is not controlled, it may put you at risk for serious  complications. Your personal target blood pressure may vary depending on your medical conditions, your age, and other factors. For most people, a normal blood pressure is less than 120/80. Hypertension is treated with lifestyle changes, medicines, or a combination of both. Lifestyle changes include losing weight, eating a healthy, low-sodium diet, exercising more, and limiting alcohol. This information is not intended to replace advice given to you by your health care provider. Make sure you discuss any questions you have with your health care provider. Document Revised: 11/07/2020 Document Reviewed: 11/07/2020 Elsevier Patient Education  2023 [employer].

## 2021-11-23 NOTE — Progress Notes (Signed)
Established Patient Office Visit  Subjective   Patient ID: Oscar Cox, male    DOB: 06-19-1975  Age: 46 y.o. MRN: [id number]  Chief Complaint  Patient presents with   Medical Management of Chronic Issues    6 month    Hypertension This is a chronic problem. The current episode started more than 1 year ago. The problem has been gradually improving since onset. The problem is controlled. Pertinent negatives include no chest pain. Risk factors for coronary artery disease include diabetes mellitus. The current treatment provides significant improvement. There are no compliance problems.   Hyperlipidemia This is a chronic problem. The current episode started more than 1 year ago. The problem is controlled. Recent lipid tests were reviewed and are variable. Exacerbating diseases include diabetes. Pertinent negatives include no chest pain, focal sensory loss or focal weakness. Current antihyperlipidemic treatment includes statins.  Diabetes He presents for his follow-up diabetic visit. He has type 2 diabetes mellitus. The initial diagnosis of diabetes was made 5 years ago. His disease course has been stable. There are no hypoglycemic associated symptoms. Pertinent negatives for diabetes include no chest pain. There are no hypoglycemic complications. Diabetic complications include nephropathy. Risk factors for coronary artery disease include hypertension and dyslipidemia. He is compliant with treatment most of the time.    Patient Active Problem List   Diagnosis Date Noted   Foraminal stenosis of cervical region 02/22/2021   Epigastric pain 09/04/2020   Cervical radiculopathy 08/22/2020   Callus of foot 05/29/2020   Seasonal allergies 05/08/2020   Neck pain 05/08/2020   Constipation 03/06/2020   Family history of colon cancer 03/06/2020   Mixed hyperlipidemia 01/29/2020   Essential hypertension, benign 11/27/2016   Diabetic neuropathy with neurologic complication ([place]) 24/82/5003    Type 2 diabetes mellitus with diabetic neuropathy, without long-term current use of insulin ([place]) 10/29/2016   Gastroesophageal reflux disease without esophagitis 10/29/2016   Past Medical History:  Diagnosis Date   Acid reflux    Diabetes mellitus without complication ([place])    Essential hypertension, benign 11/27/2016   Hepatitis C virus infection cured after antiviral drug therapy 09/24/2018   High cholesterol 11/27/2016   Past Surgical History:  Procedure Laterality Date   COLONOSCOPY WITH PROPOFOL N/A 03/21/2020   Castaneda:normal ileum, diverticulosis in sigmoid colon, descending colon and ascending colon. Non bleeding internal hemorrhoids, no specimens collected.   FOOT SURGERY Left    NO PAST SURGERIES     Social History   Tobacco Use   Smoking status: Every Day    Packs/day: 1.00    Types: Cigarettes   Smokeless tobacco: Never  Vaping Use   Vaping Use: Never used  Substance Use Topics   Alcohol use: Yes    Comment: rarely   Drug use: Yes    Frequency: 3.0 times per week    Types: Marijuana    Comment: 03/19/20 last used   Social History   Socioeconomic History   Marital status: Single    Spouse name: Not on file   Number of children: Not on file   Years of education: Not on file   Highest education level: Not on file  Occupational History   Occupation: Unemployed  Tobacco Use   Smoking status: Every Day    Packs/day: 1.00    Types: Cigarettes   Smokeless tobacco: Never  Vaping Use   Vaping Use: Never used  Substance and Sexual Activity   Alcohol use: Yes    Comment: rarely  Drug use: Yes    Frequency: 3.0 times per week    Types: Marijuana    Comment: 03/19/20 last used   Sexual activity: Not on file  Other Topics Concern   Not on file  Social History Narrative   Not on file   Social Determinants of Health   Financial Resource Strain: Not on file  Food Insecurity: Not on file  Transportation Needs: Not on file  Physical Activity: Not on file   Stress: Not on file  Social Connections: Not on file  Intimate Partner Violence: Not on file   Family Status  Relation Name Status   Other single Alive   Mother  Alive   Father  Deceased   Brother  Alive   MGM  Deceased   MGF  Deceased   PGM  Deceased   PGF  Deceased   Daughter  Alive   Daughter  Alive   Family History  Problem Relation Age of Onset   Hypertension Mother    Breast cancer Mother    Diabetes Father    Heart disease Father    Hypertension Father    Colon cancer Father    Diabetes Brother    Hypertension Brother    Thyroid cancer Brother        Anaplastic   Allergies  Allergen Reactions   Tamiflu [Oseltamivir Phosphate] Hives   Metformin And Related Diarrhea   Morphine Nausea Only   Potassium Chloride Nausea And Vomiting      Review of Systems  Constitutional: Negative.   HENT: Negative.    Eyes: Negative.   Respiratory: Negative.    Cardiovascular:  Negative for chest pain.  Genitourinary: Negative.  Negative for flank pain.  Musculoskeletal: Negative.   Skin: Negative.   Neurological: Negative.  Negative for focal weakness.  Psychiatric/Behavioral: Negative.    All other systems reviewed and are negative.     Objective:     BP 120/79   Pulse (!) 104   Temp 98.6 F (37 C)   Ht _0  (1.854 m)   Wt 191 lb (86.6 kg)   SpO2 90%   BMI 25.20 kg/m  BP Readings from Last 3 Encounters:  11/23/21 120/79  05/23/21 120/81  02/22/21 111/76   Wt Readings from Last 3 Encounters:  11/23/21 191 lb (86.6 kg)  05/23/21 190 lb 6.4 oz (86.4 kg)  02/22/21 191 lb 3.2 oz (86.7 kg)      Physical Exam Vitals and nursing note reviewed.  Constitutional:      Appearance: Normal appearance.  HENT:     Right Ear: External ear normal.     Left Ear: External ear normal.     Nose: Nose normal.     Mouth/Throat:     Mouth: Mucous membranes are moist.     Pharynx: Oropharynx is clear.  Eyes:     Conjunctiva/sclera: Conjunctivae normal.   Cardiovascular:     Pulses: Normal pulses.     Heart sounds: Normal heart sounds.  Pulmonary:     Effort: Pulmonary effort is normal.     Breath sounds: Normal breath sounds.  Abdominal:     General: Bowel sounds are normal.  Musculoskeletal:        General: Normal range of motion.  Skin:    General: Skin is warm.     Findings: No erythema or rash.  Neurological:     General: No focal deficit present.     Mental Status: He is alert and oriented to  person, place, and time.  Psychiatric:        Mood and Affect: Mood normal.        Behavior: Behavior normal.      No results found for any visits on 11/23/21.  Last metabolic panel Lab Results  Component Value Date   GLUCOSE 176 (H) 05/23/2021   NA 136 05/23/2021   K 4.0 05/23/2021   CL 97 05/23/2021   CO2 25 05/23/2021   BUN 14 05/23/2021   CREATININE 1.04 05/23/2021   EGFR 90 05/23/2021   CALCIUM 9.7 05/23/2021   PROT 6.9 05/23/2021   ALBUMIN 4.6 05/23/2021   LABGLOB 2.3 05/23/2021   AGRATIO 2.0 05/23/2021   BILITOT 0.4 05/23/2021   ALKPHOS 70 05/23/2021   AST 15 05/23/2021   ALT 16 05/23/2021   ANIONGAP 10 09/30/2014   Last thyroid functions Lab Results  Component Value Date   TSH 0.76 03/06/2020   T4TOTAL 7.8 07/09/2019      The 10-year ASCVD risk score (Arnett DK, et al., 2019) is: 6.5%    Assessment & Plan:   Problem List Items Addressed This Visit       Cardiovascular and Mediastinum   Essential hypertension, benign - Primary    Hypertension well controlled on current regimen. No changes necessary, follow up in 6 months.        Endocrine   Diabetic neuropathy with neurologic complication (HCC)   Relevant Orders   CMP14+EGFR   HCV Ab w Reflex to Quant PCR   TSH   PSA, total and free   Lipid panel   Bayer DCA Hb A1c Waived   Vitamin B12   Type 2 diabetes mellitus with diabetic neuropathy, without long-term current use of insulin ([place])    Diabetes well controlled. Completed labs, results  pending. Continue diabetic diet, weight loss and exercise. Follow up in 6 months.       Relevant Orders   CBC with Differential     Other   Mixed hyperlipidemia    No changes to current regimen, symptom=ms are well controlled. Follow up in 6 months.        Return in about 6 months (around 05/24/2022) for chronic disease management.    Ivy Lynn, NP

## 2021-11-24 LAB — LIPID PANEL
Chol/HDL Ratio: 2.9 ratio (ref 0.0–5.0)
Cholesterol, Total: 160 mg/dL (ref 100–199)
HDL: 56 mg/dL (ref >39)
LDL Chol Calc (NIH): 91 mg/dL (ref 0–99)
Triglycerides: 65 mg/dL (ref 0–149)
VLDL Cholesterol Cal: 13 mg/dL (ref 5–40)

## 2021-11-24 LAB — CMP14+EGFR
ALT: 19 IU/L (ref 0–44)
AST: 14 IU/L (ref 0–40)
Albumin/Globulin Ratio: 2 (ref 1.2–2.2)
Albumin: 4.9 g/dL (ref 4.1–5.1)
Alkaline Phosphatase: 65 IU/L (ref 44–121)
BUN/Creatinine Ratio: 15 (ref 9–20)
BUN: 15 mg/dL (ref 6–24)
Bilirubin Total: 0.4 mg/dL (ref 0.0–1.2)
CO2: 23 mmol/L (ref 20–29)
Calcium: 10.1 mg/dL (ref 8.7–10.2)
Chloride: 101 mmol/L (ref 96–106)
Creatinine, Ser: 1.03 mg/dL (ref 0.76–1.27)
Globulin, Total: 2.5 g/dL (ref 1.5–4.5)
Glucose: 134 mg/dL — ABNORMAL HIGH (ref 70–99)
Potassium: 4 mmol/L (ref 3.5–5.2)
Sodium: 144 mmol/L (ref 134–144)
Total Protein: 7.4 g/dL (ref 6.0–8.5)
eGFR: 91 mL/min/{1.73_m2} (ref >59)

## 2021-11-24 LAB — CBC WITH DIFFERENTIAL/PLATELET
Basophils Absolute: 0.1 10*3/uL (ref 0.0–0.2)
Basos: 1 %
EOS (ABSOLUTE): 0.2 10*3/uL (ref 0.0–0.4)
Eos: 2 %
Hematocrit: 51.8 % — ABNORMAL HIGH (ref 37.5–51.0)
Hemoglobin: 17.7 g/dL (ref 13.0–17.7)
Immature Grans (Abs): 0 10*3/uL (ref 0.0–0.1)
Immature Granulocytes: 0 %
Lymphocytes Absolute: 2.5 10*3/uL (ref 0.7–3.1)
Lymphs: 19 %
MCH: 31.1 pg (ref 26.6–33.0)
MCHC: 34.2 g/dL (ref 31.5–35.7)
MCV: 91 fL (ref 79–97)
Monocytes Absolute: 0.8 10*3/uL (ref 0.1–0.9)
Monocytes: 6 %
Neutrophils Absolute: 9.3 10*3/uL — ABNORMAL HIGH (ref 1.4–7.0)
Neutrophils: 72 %
Platelets: 236 10*3/uL (ref 150–450)
RBC: 5.69 x10E6/uL (ref 4.14–5.80)
RDW: 12.2 % (ref 11.6–15.4)
WBC: 12.9 10*3/uL — ABNORMAL HIGH (ref 3.4–10.8)

## 2021-11-24 LAB — PSA, TOTAL AND FREE
PSA, Free Pct: 26 %
PSA, Free: 0.13 ng/mL
Prostate Specific Ag, Serum: 0.5 ng/mL (ref 0.0–4.0)

## 2021-11-24 LAB — HCV RT-PCR, QUANT (NON-GRAPH): Hepatitis C Quantitation: NOT DETECTED IU/mL

## 2021-11-24 LAB — HCV AB W REFLEX TO QUANT PCR: HCV Ab: REACTIVE — AB

## 2021-11-24 LAB — TSH: TSH: 1.61 u[IU]/mL (ref 0.450–4.500)

## 2021-11-24 LAB — VITAMIN B12: Vitamin B-12: 331 pg/mL (ref 232–1245)

## 2021-11-26 ENCOUNTER — Encounter (INDEPENDENT_AMBULATORY_CARE_PROVIDER_SITE_OTHER): Payer: Self-pay | Admitting: Gastroenterology

## 2021-11-29 ENCOUNTER — Other Ambulatory Visit: Payer: Self-pay | Admitting: Nurse Practitioner

## 2021-11-29 DIAGNOSIS — R768 Other specified abnormal immunological findings in serum: Secondary | ICD-10-CM

## 2021-12-15 ENCOUNTER — Other Ambulatory Visit: Payer: Self-pay | Admitting: Family Medicine

## 2021-12-15 DIAGNOSIS — E1140 Type 2 diabetes mellitus with diabetic neuropathy, unspecified: Secondary | ICD-10-CM

## 2021-12-17 ENCOUNTER — Other Ambulatory Visit: Payer: Self-pay | Admitting: Family Medicine

## 2021-12-17 DIAGNOSIS — E1140 Type 2 diabetes mellitus with diabetic neuropathy, unspecified: Secondary | ICD-10-CM

## 2021-12-17 DIAGNOSIS — E782 Mixed hyperlipidemia: Secondary | ICD-10-CM

## 2022-01-20 ENCOUNTER — Other Ambulatory Visit: Payer: Self-pay | Admitting: Family Medicine

## 2022-01-20 DIAGNOSIS — E1140 Type 2 diabetes mellitus with diabetic neuropathy, unspecified: Secondary | ICD-10-CM

## 2022-01-20 DIAGNOSIS — K219 Gastro-esophageal reflux disease without esophagitis: Secondary | ICD-10-CM

## 2022-01-20 DIAGNOSIS — I10 Essential (primary) hypertension: Secondary | ICD-10-CM

## 2022-01-29 ENCOUNTER — Encounter: Payer: Self-pay | Admitting: *Deleted

## 2022-02-21 ENCOUNTER — Other Ambulatory Visit: Payer: Self-pay | Admitting: *Deleted

## 2022-02-21 DIAGNOSIS — E1140 Type 2 diabetes mellitus with diabetic neuropathy, unspecified: Secondary | ICD-10-CM

## 2022-02-21 MED ORDER — GABAPENTIN 400 MG PO CAPS: 400.0000 mg | ORAL_CAPSULE | Freq: Three times a day (TID) | ORAL | 0 refills | Status: DC

## 2022-04-29 ENCOUNTER — Other Ambulatory Visit: Payer: Self-pay | Admitting: *Deleted

## 2022-04-29 DIAGNOSIS — J302 Other seasonal allergic rhinitis: Secondary | ICD-10-CM

## 2022-04-29 MED ORDER — FLUTICASONE PROPIONATE 50 MCG/ACT NA SUSP: NASAL | 1 refills | Status: DC

## 2022-05-24 ENCOUNTER — Encounter: Payer: Self-pay | Admitting: Family Medicine

## 2022-05-24 ENCOUNTER — Ambulatory Visit: Payer: Medicaid Other | Admitting: Nurse Practitioner

## 2022-05-24 ENCOUNTER — Ambulatory Visit: Payer: Medicaid Other | Admitting: Family Medicine

## 2022-05-24 VITALS — BP 94/66 | HR 98 | Temp 98.0°F | Ht 73.0 in | Wt 196.0 lb

## 2022-05-24 DIAGNOSIS — E1169 Type 2 diabetes mellitus with other specified complication: Secondary | ICD-10-CM

## 2022-05-24 DIAGNOSIS — Z7984 Long term (current) use of oral hypoglycemic drugs: Secondary | ICD-10-CM

## 2022-05-24 DIAGNOSIS — J302 Other seasonal allergic rhinitis: Secondary | ICD-10-CM | POA: Diagnosis not present

## 2022-05-24 DIAGNOSIS — E1140 Type 2 diabetes mellitus with diabetic neuropathy, unspecified: Secondary | ICD-10-CM

## 2022-05-24 DIAGNOSIS — I152 Hypertension secondary to endocrine disorders: Secondary | ICD-10-CM | POA: Diagnosis not present

## 2022-05-24 DIAGNOSIS — E785 Hyperlipidemia, unspecified: Secondary | ICD-10-CM

## 2022-05-24 DIAGNOSIS — E1149 Type 2 diabetes mellitus with other diabetic neurological complication: Secondary | ICD-10-CM | POA: Diagnosis not present

## 2022-05-24 DIAGNOSIS — L84 Corns and callosities: Secondary | ICD-10-CM | POA: Diagnosis not present

## 2022-05-24 DIAGNOSIS — E1159 Type 2 diabetes mellitus with other circulatory complications: Secondary | ICD-10-CM | POA: Diagnosis not present

## 2022-05-24 DIAGNOSIS — Z8639 Personal history of other endocrine, nutritional and metabolic disease: Secondary | ICD-10-CM | POA: Diagnosis not present

## 2022-05-24 DIAGNOSIS — K219 Gastro-esophageal reflux disease without esophagitis: Secondary | ICD-10-CM

## 2022-05-24 DIAGNOSIS — F1990 Other psychoactive substance use, unspecified, uncomplicated: Secondary | ICD-10-CM

## 2022-05-24 LAB — BAYER DCA HB A1C WAIVED: HB A1C (BAYER DCA - WAIVED): 6.7 % — ABNORMAL HIGH (ref 4.8–5.6)

## 2022-05-24 LAB — LIPID PANEL

## 2022-05-24 LAB — CMP14+EGFR

## 2022-05-24 LAB — CBC WITH DIFFERENTIAL/PLATELET
EOS (ABSOLUTE): 0.2 10*3/uL (ref 0.0–0.4)
Eos: 2 %
Immature Grans (Abs): 0 10*3/uL (ref 0.0–0.1)
Immature Granulocytes: 0 %
Lymphocytes Absolute: 2.5 10*3/uL (ref 0.7–3.1)
MCV: 93 fL (ref 79–97)
Neutrophils Absolute: 7.8 10*3/uL — ABNORMAL HIGH (ref 1.4–7.0)
RBC: 5.34 x10E6/uL (ref 4.14–5.80)

## 2022-05-24 LAB — VITAMIN B12

## 2022-05-24 MED ORDER — LISINOPRIL 20 MG PO TABS
20.0000 mg | ORAL_TABLET | Freq: Every day | ORAL | 1 refills | Status: DC
Start: 2022-05-24 — End: 2023-01-13

## 2022-05-24 MED ORDER — OMEPRAZOLE 20 MG PO CPDR
20.0000 mg | DELAYED_RELEASE_CAPSULE | Freq: Every day | ORAL | 1 refills | Status: DC
Start: 2022-05-24 — End: 2023-01-13

## 2022-05-24 MED ORDER — MONTELUKAST SODIUM 10 MG PO TABS: 10.0000 mg | ORAL_TABLET | Freq: Every day | ORAL | 1 refills | Status: DC

## 2022-05-24 MED ORDER — GLYXAMBI 25-5 MG PO TABS
1.0000 | ORAL_TABLET | Freq: Every day | ORAL | 1 refills | Status: DC
Start: 2022-05-24 — End: 2023-01-13

## 2022-05-24 MED ORDER — CETIRIZINE HCL 10 MG PO TABS
10.0000 mg | ORAL_TABLET | Freq: Every day | ORAL | 2 refills | Status: DC
Start: 2022-05-24 — End: 2023-03-12

## 2022-05-24 MED ORDER — PRAVASTATIN SODIUM 40 MG PO TABS
40.0000 mg | ORAL_TABLET | Freq: Every day | ORAL | 1 refills | Status: DC
Start: 2022-05-24 — End: 2022-12-09

## 2022-05-24 NOTE — Progress Notes (Signed)
Acute Office Visit  Subjective:  Patient ID: Oscar Cox, male    DOB: January 24, 1975, 47 y.o.   MRN: [id number]  Chief Complaint  Patient presents with   chronic conditions   HPI Patient is in today for follow up of chronic   Type 2 diabetes mellitus with diabetic neuropathy, without long-term current use of insulin (HCC)  Type 2 Diabetes with neuropathy  Glucometer:Accucheck.  Does not check it regularly  High at home: 168; Low at home: 90, Taking medication(s): glyxambi,.  Last eye exam: due - [facility]  Maurice Small foot exam: due  Last A1c:  Lab Results  Component Value Date   HGBA1C 6.4 (H) 11/23/2021   Nephropathy screen indicated?: collected today  Last flu, zoster and/or pneumovax:  Immunization History  Administered Date(s) Administered   Tdap 06/02/2020   ROS: Denies dizziness, LOC, polyuria, polydipsia, unintended weight loss/gain, foot ulcerations, , shortness of breath or chest pain. numbness or tingling in extremities improving since he has been treated for neck pain  Hyperlipidemia associated with type 2 diabetes mellitus (HCC)  Denies myalgias  Tolerating statin   Hypertension associated with type 2 diabetes mellitus (HCC) Has monitor at home, does not check it regularly.  Reports that when he does check it, it is controlled 110/80  Denies cough with lisinopril.  Denies SOB, Chest pain, palpitations  Gastroesophageal reflux disease without esophagitis Reports that it is doing okay. States that it was "horrible when locked up" he was not given his medications.   Substance Use  Smokes marijuana daily. Also smokes meth daily- most days of the week. States that he has been doing it for about a year and that he did not have withdrawals when he was in jail (about 2 weeks ago).   ROS As per HPI   Objective:  BP 94/66   Pulse 98   Temp 98 F (36.7 C)   Ht 6\' 1"  (1.854 m)   Wt 196 lb (88.9 kg)   SpO2 96%   BMI 25.86 kg/m    Physical  Exam Constitutional:      General: He is awake. He is not in acute distress.    Appearance: Normal appearance. He is not ill-appearing, toxic-appearing or diaphoretic.     Comments: Not well groomed. Pungent smell of marijuana on patient   Cardiovascular:     Rate and Rhythm: Normal rate and regular rhythm.     Pulses:          Radial pulses are 2+ on the right side and 2+ on the left side.       Posterior tibial pulses are 2+ on the right side and 2+ on the left side.     Heart sounds: Normal heart sounds.  Pulmonary:     Effort: Pulmonary effort is normal.     Breath sounds: Normal breath sounds. No stridor, decreased air movement or transmitted upper airway sounds. No decreased breath sounds, wheezing, rhonchi or rales.  Feet:     Right foot:     Protective Sensation: 10 sites tested.  10 sites sensed.     Left foot:     Protective Sensation: 10 sites tested.  10 sites sensed.     Comments: Decreased sensation of right great toe Callus of medial aspect of great toe Skin:    General: Skin is warm.     Capillary Refill: Capillary refill takes less than 2 seconds.  Neurological:     Mental Status: He is alert  and oriented to person, place, and time.  Psychiatric:        Attention and Perception: Attention and perception normal.        Behavior: Behavior is cooperative.    No results found for any visits on 05/24/22.     05/24/2022    9:13 AM 11/23/2021    9:50 AM 05/23/2021    9:53 AM  Depression screen PHQ 2/9  Decreased Interest 1 2 1   Down, Depressed, Hopeless 1 1 1   PHQ - 2 Score 2 3 2   Altered sleeping 1 1 2   Tired, decreased energy 1 1 1   Change in appetite 0 0 1  Feeling bad or failure about yourself  0 0 0  Trouble concentrating 0 1 1  Moving slowly or fidgety/restless 0 1 0  Suicidal thoughts 0 0 0  PHQ-9 Score 4 7 7   Difficult doing work/chores  Somewhat difficult Somewhat difficult      05/24/2022    9:13 AM 11/23/2021    9:51 AM 05/23/2021    9:53 AM  02/22/2021    9:31 AM  GAD 7 : Generalized Anxiety Score  Nervous, Anxious, on Edge 1 2 2 1   Control/stop worrying 0 1 1 1   Worry too much - different things 1 2 2 1   Trouble relaxing 0 1 2 1   Restless 0 1 1 0  Easily annoyed or irritable 1 2 2 1   Afraid - awful might happen 0 1 0 0  Total GAD 7 Score 3 10 10 5   Anxiety Difficulty Somewhat difficult Somewhat difficult Somewhat difficult Somewhat difficult   Assessment & Plan:  1. Type 2 diabetes mellitus with diabetic neuropathy, without long-term current use of insulin (HCC) Labs as below. Will communicate results to patient once available.  Unsure if patient is fasting today for labs. Patient not able to come back in for labs later.  - Bayer DCA Hb A1c Waived - Microalbumin / creatinine urine ratio - CBC with Differential/Platelet - CMP14+EGFR - Empagliflozin-linaGLIPtin (GLYXAMBI) 25-5 MG TABS; Take 1 tablet by mouth daily.  Dispense: 90 tablet; Refill: 1 - pravastatin (PRAVACHOL) 40 MG tablet; Take 1 tablet (40 mg total) by mouth daily.  Dispense: 90 tablet; Refill: 1  2. Diabetic neuropathy with neurologic complication [facility]) Referral to podiatry placed below. Gabapentin did not improve symptoms. Patient had symptom improvement while being treated for cervical neck pain. Will continue to assess and monitor progression. Foot exam completed today.   3. Hyperlipidemia associated with type 2 diabetes mellitus (HCC) Labs as below. Will communicate results to patient once available. Fasting status unknown.  - Lipid panel - pravastatin (PRAVACHOL) 40 MG tablet; Take 1 tablet (40 mg total) by mouth daily.  Dispense: 90 tablet; Refill: 1  4. Hypertension associated with type 2 diabetes mellitus (HCC) Refill as below. Patient well within goal. Instructed patient to monitor at home.  - lisinopril (ZESTRIL) 20 MG tablet; Take 1 tablet (20 mg total) by mouth daily.  Dispense: 90 tablet; Refill: 1  5. Gastroesophageal reflux disease without  esophagitis Refill as below. Well controlled. Continue current regimen.  - omeprazole (PRILOSEC) 20 MG capsule; Take 1 capsule (20 mg total) by mouth daily.  Dispense: 90 capsule; Refill: 1  6. Personal history of other endocrine, nutritional and metabolic disease Labs as below. Will communicate results to patient once available.  - Vitamin B12 - TSH  7. Seasonal allergies Refills as below. Well controlled on current regimen.  - cetirizine (ZYRTEC) 10 MG  tablet; Take 1 tablet (10 mg total) by mouth daily.  Dispense: 90 tablet; Refill: 2 - montelukast (SINGULAIR) 10 MG tablet; Take 1 tablet (10 mg total) by mouth at bedtime.  Dispense: 90 tablet; Refill: 1  8. Substance use disorder Had very open and frank conversation with patient about his use of marijuana and methamphetamines. Patient states that he is not addicted and that he does not want resources for recovery at this time. Patient was offered resources for discontinuation of substances, he declined. Educated patient on the risk he is putting himself at for Cardiovascular events. Discussed with patient that he is using both a depressant and a stimulant, so they are working against each other. He has a history of substance use with Opana and went through withdrawal/recovery to discontinue taking Opana. Will continue to educate patient on the risks and assess readiness to quit.   9. Callus of foot Referral placed as below.  - Ambulatory referral to Podiatry  The above assessment and management plan was discussed with the patient. The patient verbalized understanding of and has agreed to the management plan using shared-decision making. Patient is aware to call the clinic if they develop any new symptoms or if symptoms fail to improve or worsen. Patient is aware when to return to the clinic for a follow-up visit. Patient educated on when it is appropriate to go to the emergency department.   Return in about 6 months (around  11/24/2022).  Neale Burly, DNP-FNP [facility] [address] [place], [place] [address] [phone]

## 2022-05-25 LAB — CBC WITH DIFFERENTIAL/PLATELET
Basophils Absolute: 0.1 10*3/uL (ref 0.0–0.2)
Basos: 0 %
Hematocrit: 49.6 % (ref 37.5–51.0)
Hemoglobin: 16.6 g/dL (ref 13.0–17.7)
Lymphs: 22 %
MCH: 31.1 pg (ref 26.6–33.0)
MCHC: 33.5 g/dL (ref 31.5–35.7)
Monocytes Absolute: 0.6 10*3/uL (ref 0.1–0.9)
Monocytes: 6 %
Neutrophils: 70 %
Platelets: 271 10*3/uL (ref 150–450)
RDW: 11.9 % (ref 11.6–15.4)
WBC: 11.2 10*3/uL — ABNORMAL HIGH (ref 3.4–10.8)

## 2022-05-25 LAB — MICROALBUMIN / CREATININE URINE RATIO
Creatinine, Urine: 141.5 mg/dL
Microalb/Creat Ratio: 5 mg/g creat (ref 0–29)
Microalbumin, Urine: 7.7 ug/mL

## 2022-05-25 LAB — CMP14+EGFR
Albumin: 4.5 g/dL (ref 4.1–5.1)
Alkaline Phosphatase: 61 IU/L (ref 44–121)
CO2: 26 mmol/L (ref 20–29)
Calcium: 9.6 mg/dL (ref 8.7–10.2)
Chloride: 102 mmol/L (ref 96–106)
Creatinine, Ser: 0.9 mg/dL (ref 0.76–1.27)
Globulin, Total: 2.2 g/dL (ref 1.5–4.5)
Glucose: 130 mg/dL — ABNORMAL HIGH (ref 70–99)
Potassium: 4.3 mmol/L (ref 3.5–5.2)
Total Protein: 6.7 g/dL (ref 6.0–8.5)
eGFR: 107 mL/min/{1.73_m2} (ref >59)

## 2022-05-25 LAB — LIPID PANEL
HDL: 40 mg/dL (ref >39)
LDL Chol Calc (NIH): 77 mg/dL (ref 0–99)
Triglycerides: 217 mg/dL — ABNORMAL HIGH (ref 0–149)
VLDL Cholesterol Cal: 36 mg/dL (ref 5–40)

## 2022-05-25 LAB — TSH: TSH: 1.12 u[IU]/mL (ref 0.450–4.500)

## 2022-06-03 ENCOUNTER — Ambulatory Visit: Payer: Medicaid Other | Admitting: Podiatry

## 2022-06-03 ENCOUNTER — Ambulatory Visit (INDEPENDENT_AMBULATORY_CARE_PROVIDER_SITE_OTHER): Payer: Medicaid Other

## 2022-06-03 DIAGNOSIS — M7752 Other enthesopathy of left foot: Secondary | ICD-10-CM

## 2022-06-03 DIAGNOSIS — M7751 Other enthesopathy of right foot: Secondary | ICD-10-CM | POA: Diagnosis not present

## 2022-06-03 NOTE — Progress Notes (Signed)
       Chief Complaint  Patient presents with   Callouses    Rm 24 Right great toe callus. Pt was sent in by PCP with concerns of an wound of infections. Pt is diabetic. Took X-rays to check bone health.     HPI: 47 y.o. male presenting today with concern of discolored skin lesion on the right hallux.  Notes that he was seen by his primary care physician and was referred here for evaluation to see if there is an ulcer present.  Denies pain at this time.  Denies any recent drainage  Past Medical History:  Diagnosis Date   Acid reflux    Diabetes mellitus without complication (HCC)    Essential hypertension, benign 11/27/2016   Hepatitis C virus infection cured after antiviral drug therapy 09/24/2018   High cholesterol 11/27/2016    Past Surgical History:  Procedure Laterality Date   COLONOSCOPY WITH PROPOFOL N/A 03/21/2020   Castaneda:normal ileum, diverticulosis in sigmoid colon, descending colon and ascending colon. Non bleeding internal hemorrhoids, no specimens collected.   FOOT SURGERY Left    NO PAST SURGERIES      Allergies  Allergen Reactions   Tamiflu [Oseltamivir Phosphate] Hives   Metformin And Related Diarrhea   Morphine Nausea Only   Potassium Chloride Nausea And Vomiting    Physical Exam:  General: The patient is alert and oriented x3 in no acute distress.  Dermatology: Skin is warm, dry and supple bilateral lower extremities. Interspaces are clear of maceration and debris.  There is thick hyperkeratotic buildup on the plantar medial aspect of the right hallux IPJ.  The callus is hemorrhagic.  Upon shaving the hyperkeratosis, no ulceration was noted.  No drainage was noted.  There is no surrounding erythema.  This is stable  Vascular: Palpable pedal pulses bilaterally. Capillary refill within normal limits.  No appreciable edema.  No erythema or calor.  Neurological: Light touch sensation grossly intact bilateral feet.   Musculoskeletal Exam: No pedal  deformities noted  Radiographic Exam:  Normal osseous mineralization. Joint spaces preserved.  No fractures or osseous irregularities noted.  No evidence of foreign body.  Assessment/Plan of Care: 1. Capsulitis of metatarsophalangeal (MTP) joints of both feet     Discussed clinical findings with patient today. With the patient's consent the hyperkeratotic lesion was shaved with a sterile #312 blade.  This revealed intact healthy skin underneath.  No further treatment needed with regard to daily care at home.  A felt offloading pad was applied to the underside of her shoe insole to decrease pressure to the area and reduce chance of recurrence.    Follow-up as needed   Clerance Lav, DPM, FACFAS [facility]     [address] [place], [place] [address]                Office [phone]  Fax [phone]

## 2022-10-24 ENCOUNTER — Encounter: Payer: Self-pay | Admitting: Family Medicine

## 2022-10-24 ENCOUNTER — Ambulatory Visit: Payer: Medicaid Other | Admitting: Family Medicine

## 2022-10-24 VITALS — BP 109/72 | HR 100 | Temp 98.3°F | Ht 73.0 in | Wt 190.0 lb

## 2022-10-24 DIAGNOSIS — M5412 Radiculopathy, cervical region: Secondary | ICD-10-CM | POA: Diagnosis not present

## 2022-10-24 DIAGNOSIS — E1159 Type 2 diabetes mellitus with other circulatory complications: Secondary | ICD-10-CM | POA: Diagnosis not present

## 2022-10-24 DIAGNOSIS — E1140 Type 2 diabetes mellitus with diabetic neuropathy, unspecified: Secondary | ICD-10-CM | POA: Diagnosis not present

## 2022-10-24 DIAGNOSIS — F1990 Other psychoactive substance use, unspecified, uncomplicated: Secondary | ICD-10-CM

## 2022-10-24 DIAGNOSIS — E1149 Type 2 diabetes mellitus with other diabetic neurological complication: Secondary | ICD-10-CM | POA: Diagnosis not present

## 2022-10-24 DIAGNOSIS — Z1331 Encounter for screening for depression: Secondary | ICD-10-CM

## 2022-10-24 DIAGNOSIS — E1169 Type 2 diabetes mellitus with other specified complication: Secondary | ICD-10-CM | POA: Diagnosis not present

## 2022-10-24 DIAGNOSIS — L989 Disorder of the skin and subcutaneous tissue, unspecified: Secondary | ICD-10-CM

## 2022-10-24 DIAGNOSIS — I152 Hypertension secondary to endocrine disorders: Secondary | ICD-10-CM | POA: Diagnosis not present

## 2022-10-24 DIAGNOSIS — E785 Hyperlipidemia, unspecified: Secondary | ICD-10-CM | POA: Diagnosis not present

## 2022-10-24 DIAGNOSIS — K219 Gastro-esophageal reflux disease without esophagitis: Secondary | ICD-10-CM

## 2022-10-24 DIAGNOSIS — E782 Mixed hyperlipidemia: Secondary | ICD-10-CM | POA: Diagnosis not present

## 2022-10-24 LAB — BAYER DCA HB A1C WAIVED: HB A1C (BAYER DCA - WAIVED): 6.2 % — ABNORMAL HIGH (ref 4.8–5.6)

## 2022-10-24 NOTE — Progress Notes (Signed)
Subjective:  Patient ID: Oscar Cox, male    DOB: 11/10/1975, 47 y.o.   MRN: [id number]  Patient Care Team: Arrie Senate, FNP as PCP - General (Family Medicine) Delora Fuel, OD (Optometry)   Chief Complaint:  Medical Management of Chronic Issues (Skin growth near left ear/Referral for [facility] neurosurgery)  HPI: Oscar Cox is a 47 y.o. male presenting on 10/24/2022 for Medical Management of Chronic Issues (Skin growth near left ear/Referral for [facility] neurosurgery)  HPI 1. Type 2 diabetes mellitus with diabetic neuropathy, without long-term current use of insulin (HCC) 2. Diabetic neuropathy with neurologic complication (HCC) Glucometer:has one at home. States that he checks it every now and then.   High at home: 120; Low at home: 90s, Taking medication(s): glyxambi,.  Last eye exam: due  Last foot exam: May 2024  Last A1c:  Lab Results  Component Value Date   HGBA1C 6.7 (H) 05/24/2022   Nephropathy screen indicated?: due in May 2025  Last flu, zoster and/or pneumovax:  Immunization History  Administered Date(s) Administered   Tdap 06/02/2020    ROS: Denies dizziness, LOC, polyuria, polydipsia, unintended weight loss/gain, foot ulcerations, numbness or tingling in extremities, shortness of breath or chest pain.   3. Hypertension associated with type 2 diabetes mellitus (HCC) Has BP monitor at home Yes BP at home average - has not checked it recently  ROS Denies anxiety, fatigue, peripheral edema, changes to vision, chest pain, headaches, palpitations, sweats, SOB, PND, orthopnea Meds Lisinopril  CAD risks Diabetes Mellitus, hypertension, hypercholesterolemia/hyperlipidemia   4. Gastroesophageal reflux disease without esophagitis The patient was last seen for GERD 5 months ago. Changes made since that visit include none.  He reports excellent compliance with treatment. He is not having side effects.Marland Kitchen  He IS experiencing bilious  reflux.and heartburng He is NOT experiencing abdominal bloating, belching, belching and eructation, chest pain, choking on food, cough, deep pressure at base of neck, difficulty swallowing, dysphagia, early satiety, fullness after meals, hematemesis, hoarseness, laryngitis, melena, midespigastric pain, nausea, need to clear throat frequently, nocturnal burning, or odynophagia  -----------------------------------------------------------------------------------------   5. Hyperlipidemia associated with type 2 diabetes mellitus (HCC) negative. There is a family history of hyperlipidemia. There is not a family history of early ischemia heart disease. Brother passed younger than 36 due to Cancer - states that it was thyroid cancer.  Reports 100% compliance with pravastatin.   6. Substance use disorder Continues to use marijuana daily. States that he is not using meth anymore. Stopped months ago. States that he cannot afford it.    7. Neck Pain  Continues to have neck. Endorses numbness and tingling in his feet, hands, legs. Was previously established with neurosurgery at [facility] and would like to go back. Rates pain as a 5/10. Describes it as burning.   8. Skin Lesion  States that he has a skin lesion on his left face that has been there for several years. States that it is growing. States that it will break off and grow again. Denies pain, itching at the site.   9. Depression  States that he is managing well. Does not wish to start medication. Does not wish to start counseling. Denies SI.   Relevant past medical, surgical, family, and social history reviewed and updated as indicated.  Allergies and medications reviewed and updated. Data reviewed: Chart in Epic.   Past Medical History:  Diagnosis Date   Acid reflux    Diabetes mellitus without complication (  HCC)    Essential hypertension, benign 11/27/2016   Hepatitis C virus infection cured after antiviral drug  therapy 09/24/2018   High cholesterol 11/27/2016    Past Surgical History:  Procedure Laterality Date   COLONOSCOPY WITH PROPOFOL N/A 03/21/2020   Castaneda:normal ileum, diverticulosis in sigmoid colon, descending colon and ascending colon. Non bleeding internal hemorrhoids, no specimens collected.   FOOT SURGERY Left    NO PAST SURGERIES      Social History   Socioeconomic History   Marital status: Single    Spouse name: Not on file   Number of children: Not on file   Years of education: Not on file   Highest education level: Not on file  Occupational History   Occupation: Unemployed  Tobacco Use   Smoking status: Every Day    Current packs/day: 1.00    Types: Cigarettes   Smokeless tobacco: Never  Vaping Use   Vaping status: Never Used  Substance and Sexual Activity   Alcohol use: Yes    Comment: rarely   Drug use: Yes    Frequency: 3.0 times per week    Types: Marijuana    Comment: 03/19/20 last used   Sexual activity: Not on file  Other Topics Concern   Not on file  Social History Narrative   Not on file   Social Determinants of Health   Financial Resource Strain: Not on file  Food Insecurity: Not on file  Transportation Needs: Not on file  Physical Activity: Not on file  Stress: Not on file  Social Connections: Unknown (05/21/2021)   Received from [facility], [facility]   Social Network    Social Network: Not on file  Intimate Partner Violence: Unknown (04/19/2021)   Received from [facility], [facility]   HITS    Physically Hurt: Not on file    Insult or Talk Down To: Not on file    Threaten Physical Harm: Not on file    Scream or Curse: Not on file    Outpatient Encounter Medications as of 10/24/2022  Medication Sig   cetirizine (ZYRTEC) 10 MG tablet Take 1 tablet (10 mg total) by mouth daily.   Empagliflozin-linaGLIPtin (GLYXAMBI) 25-5 MG TABS Take 1 tablet by mouth daily.   fluticasone (FLONASE) 50 MCG/ACT nasal spray Use 2 spray(s) in  each nostril once daily   lisinopril (ZESTRIL) 20 MG tablet Take 1 tablet (20 mg total) by mouth daily.   montelukast (SINGULAIR) 10 MG tablet Take 1 tablet (10 mg total) by mouth at bedtime.   omeprazole (PRILOSEC) 20 MG capsule Take 1 capsule (20 mg total) by mouth daily.   pravastatin (PRAVACHOL) 40 MG tablet Take 1 tablet (40 mg total) by mouth daily.   No facility-administered encounter medications on file as of 10/24/2022.    Allergies  Allergen Reactions   Tamiflu [Oseltamivir Phosphate] Hives   Metformin And Related Diarrhea   Morphine Nausea Only   Potassium Chloride Nausea And Vomiting    Review of Systems As per HPI  Objective:  BP 109/72   Pulse 100   Temp 98.3 F (36.8 C)   Ht 6\' 1"  (1.854 m)   Wt 190 lb (86.2 kg)   SpO2 98%   BMI 25.07 kg/m    Wt Readings from Last 3 Encounters:  05/24/22 196 lb (88.9 kg)  11/23/21 191 lb (86.6 kg)  05/23/21 190 lb 6.4 oz (86.4 kg)    Physical Exam Constitutional:      General: He is  awake. He is not in acute distress.    Appearance: Normal appearance. He is well-developed and well-groomed. He is not ill-appearing, toxic-appearing or diaphoretic.  HENT:     Head:      Comments: ~1 cm skin growth, dark pigmentation Cardiovascular:     Rate and Rhythm: Normal rate and regular rhythm.     Pulses: Normal pulses.          Radial pulses are 2+ on the right side and 2+ on the left side.       Posterior tibial pulses are 2+ on the right side and 2+ on the left side.     Heart sounds: Normal heart sounds. No murmur heard.    No gallop.  Pulmonary:     Effort: Pulmonary effort is normal. No respiratory distress.     Breath sounds: Normal breath sounds. No stridor. No wheezing, rhonchi or rales.  Musculoskeletal:     Cervical back: Full passive range of motion without pain and neck supple.     Right lower leg: No edema.     Left lower leg: No edema.  Skin:    General: Skin is warm.     Capillary Refill: Capillary refill  takes less than 2 seconds.  Neurological:     General: No focal deficit present.     Mental Status: He is alert, oriented to person, place, and time and easily aroused. Mental status is at baseline.     GCS: GCS eye subscore is 4. GCS verbal subscore is 5. GCS motor subscore is 6.     Motor: No weakness.  Psychiatric:        Attention and Perception: Attention and perception normal.        Mood and Affect: Mood and affect normal.        Speech: Speech normal.        Behavior: Behavior normal. Behavior is cooperative.        Thought Content: Thought content normal. Thought content does not include homicidal or suicidal ideation. Thought content does not include homicidal or suicidal plan.        Cognition and Memory: Cognition and memory normal.        Judgment: Judgment normal.       Results for orders placed or performed in visit on 05/24/22  HM DIABETES EYE EXAM  Result Value Ref Range   HM Diabetic Eye Exam No Retinopathy No Retinopathy       10/24/2022    8:58 AM 05/24/2022    9:13 AM 11/23/2021    9:50 AM 05/23/2021    9:53 AM 02/22/2021    9:31 AM  Depression screen PHQ 2/9  Decreased Interest 1 1 2 1 1   Down, Depressed, Hopeless 1 1 1 1  0  PHQ - 2 Score 2 2 3 2 1   Altered sleeping 1 1 1 2 2   Tired, decreased energy 1 1 1 1 2   Change in appetite 0 0 0 1 0  Feeling bad or failure about yourself  0 0 0 0 0  Trouble concentrating 0 0 1 1 0  Moving slowly or fidgety/restless 0 0 1 0 0  Suicidal thoughts 0 0 0 0 0  PHQ-9 Score 4 4 7 7 5   Difficult doing work/chores Somewhat difficult  Somewhat difficult Somewhat difficult Very difficult       10/24/2022    8:59 AM 05/24/2022    9:13 AM 11/23/2021    9:51 AM 05/23/2021  9:53 AM  GAD 7 : Generalized Anxiety Score  Nervous, Anxious, on Edge 1 1 2 2   Control/stop worrying 0 0 1 1  Worry too much - different things 1 1 2 2   Trouble relaxing 1 0 1 2  Restless 1 0 1 1  Easily annoyed or irritable 1 1 2 2   Afraid -  awful might happen 0 0 1 0  Total GAD 7 Score 5 3 10 10   Anxiety Difficulty Somewhat difficult Somewhat difficult Somewhat difficult Somewhat difficult   Pertinent labs & imaging results that were available during my care of the patient were reviewed by me and considered in my medical decision making.  Assessment & Plan:  Jag was seen today for medical management of chronic issues.  Diagnoses and all orders for this visit:  Type 2 diabetes mellitus with diabetic neuropathy, without long-term current use of insulin (HCC) Well controlled A1C. Reviewed with patient in office. Patient to continue current regimen.  -     Bayer DCA Hb A1c Waived -     CMP14+EGFR  Diabetic neuropathy with neurologic complication (HCC) As above.   Hypertension associated with type 2 diabetes mellitus (HCC) Well controlled. Continue current regimen.  -     CBC with Differential/Platelet  Gastroesophageal reflux disease without esophagitis Well controlled. Continue current regimen.   Hyperlipidemia associated with type 2 diabetes mellitus (HCC) Labs as below. Will communicate results to patient once available. Will await results to determine next steps.  Fasting unknown. If triglycerides remain elevated, can switch to crestor.  -     Lipid panel  Substance use disorder Continues to use marijuana. Praised patient on stopping methamphetamines. Will not enter into CSA with patient.  Cervical radiculopathy Referral placed as below. Patient to contact provider as he was established within the last 3 years.  -     Ambulatory referral to Neurosurgery  Skin lesion Referral as below for suspected filiform wart.  -     Ambulatory referral to Dermatology  Encounter for screening for depression Pt screened positive for depression today. Pt offered nonpharmacologic and pharmacologic therapy. Pt declined initiating treatment at this time. Safety contract established today with patient in clinic. Denies  intent to harm herself or others. Pt to notify provider if they would like to initiate treatment.    Continue all other maintenance medications.  Follow up plan: Return in about 6 months (around 04/24/2023) for Chronic Condition Follow up.  Continue healthy lifestyle choices, including diet (rich in fruits, vegetables, and lean proteins, and low in salt and simple carbohydrates) and exercise (at least 30 minutes of moderate physical activity daily).  Written and verbal instructions provided   The above assessment and management plan was discussed with the patient. The patient verbalized understanding of and has agreed to the management plan. Patient is aware to call the clinic if they develop any new symptoms or if symptoms persist or worsen. Patient is aware when to return to the clinic for a follow-up visit. Patient educated on when it is appropriate to go to the emergency department.   Neale Burly, DNP-FNP [facility] [address] [place], [place] [address] [phone]

## 2022-10-25 LAB — CBC WITH DIFFERENTIAL/PLATELET
Basophils Absolute: 0 10*3/uL (ref 0.0–0.2)
Basos: 0 %
EOS (ABSOLUTE): 0.3 10*3/uL (ref 0.0–0.4)
Eos: 3 %
Hematocrit: 53.3 % — ABNORMAL HIGH (ref 37.5–51.0)
Hemoglobin: 17.3 g/dL (ref 13.0–17.7)
Immature Grans (Abs): 0 10*3/uL (ref 0.0–0.1)
Immature Granulocytes: 0 %
Lymphocytes Absolute: 3.1 10*3/uL (ref 0.7–3.1)
Lymphs: 27 %
MCH: 30.4 pg (ref 26.6–33.0)
MCHC: 32.5 g/dL (ref 31.5–35.7)
MCV: 94 fL (ref 79–97)
Monocytes Absolute: 0.7 10*3/uL (ref 0.1–0.9)
Monocytes: 6 %
Neutrophils Absolute: 7.1 10*3/uL — ABNORMAL HIGH (ref 1.4–7.0)
Neutrophils: 64 %
Platelets: 248 10*3/uL (ref 150–450)
RBC: 5.7 x10E6/uL (ref 4.14–5.80)
RDW: 12.5 % (ref 11.6–15.4)
WBC: 11.3 10*3/uL — ABNORMAL HIGH (ref 3.4–10.8)

## 2022-10-25 LAB — CMP14+EGFR
ALT: 19 [IU]/L (ref 0–44)
AST: 15 [IU]/L (ref 0–40)
Albumin: 4.7 g/dL (ref 4.1–5.1)
Alkaline Phosphatase: 68 [IU]/L (ref 44–121)
BUN/Creatinine Ratio: 17 (ref 9–20)
BUN: 19 mg/dL (ref 6–24)
Bilirubin Total: 0.4 mg/dL (ref 0.0–1.2)
CO2: 20 mmol/L (ref 20–29)
Calcium: 9.8 mg/dL (ref 8.7–10.2)
Chloride: 97 mmol/L (ref 96–106)
Creatinine, Ser: 1.09 mg/dL (ref 0.76–1.27)
Globulin, Total: 2.8 g/dL (ref 1.5–4.5)
Glucose: 165 mg/dL — ABNORMAL HIGH (ref 70–99)
Potassium: 4.2 mmol/L (ref 3.5–5.2)
Sodium: 137 mmol/L (ref 134–144)
Total Protein: 7.5 g/dL (ref 6.0–8.5)
eGFR: 84 mL/min/{1.73_m2} (ref >59)

## 2022-10-25 LAB — LIPID PANEL
Chol/HDL Ratio: 3.4 {ratio} (ref 0.0–5.0)
Cholesterol, Total: 147 mg/dL (ref 100–199)
HDL: 43 mg/dL (ref >39)
LDL Chol Calc (NIH): 77 mg/dL (ref 0–99)
Triglycerides: 157 mg/dL — ABNORMAL HIGH (ref 0–149)
VLDL Cholesterol Cal: 27 mg/dL (ref 5–40)

## 2022-10-25 NOTE — Progress Notes (Signed)
Elevated BG. Consistent with A1C, remains controlled. Slight variation in CBC, most likely related to smoking. Triglycerides slightly elevated. Diet encouraged - increase intake of fresh fruits and vegetables, increase intake of lean proteins. Bake, broil, or grill foods. Avoid fried, greasy, and fatty foods. Avoid fast foods. Increase intake of fiber-rich whole grains. Exercise encouraged - at least 150 minutes per week and advance as tolerated. Can also try fish oil and we will recheck in 3-6 months.

## 2022-10-29 ENCOUNTER — Ambulatory Visit: Payer: Medicaid Other

## 2022-10-29 DIAGNOSIS — E1140 Type 2 diabetes mellitus with diabetic neuropathy, unspecified: Secondary | ICD-10-CM

## 2022-10-29 LAB — HM DIABETES EYE EXAM

## 2022-10-29 NOTE — Progress Notes (Signed)
Oscar Cox arrived 10/29/2022 and has given verbal consent to obtain images and complete their overdue diabetic retinal screening.  The images have been sent to an ophthalmologist or optometrist for review and interpretation.  Results will be sent back to Oscar Senate, FNP for review.  Patient has been informed they will be contacted when we receive the results via telephone or MyChart

## 2022-11-07 DIAGNOSIS — Z6825 Body mass index (BMI) 25.0-25.9, adult: Secondary | ICD-10-CM | POA: Diagnosis not present

## 2022-11-07 DIAGNOSIS — M5412 Radiculopathy, cervical region: Secondary | ICD-10-CM | POA: Diagnosis not present

## 2022-11-20 ENCOUNTER — Other Ambulatory Visit: Payer: Self-pay | Admitting: Family Medicine

## 2022-11-20 DIAGNOSIS — J302 Other seasonal allergic rhinitis: Secondary | ICD-10-CM

## 2022-11-27 NOTE — Progress Notes (Signed)
No retinopathy. Recommend repeat screening in one year

## 2022-11-28 ENCOUNTER — Telehealth: Payer: Self-pay | Admitting: Family Medicine

## 2022-11-28 DIAGNOSIS — M5412 Radiculopathy, cervical region: Secondary | ICD-10-CM | POA: Diagnosis not present

## 2022-11-28 NOTE — Telephone Encounter (Signed)
Father called about daughters results.  He is aware of negative results.

## 2022-11-28 NOTE — Telephone Encounter (Signed)
Copied from CRM [id number]. Topic: Clinical - Lab/Test Results >> Nov 28, 2022 12:43 PM Almira Coaster wrote: Reason for CRM: Patient received a call to discuss his results from a Covid test done on 11/26/2022; however, there are no results on file.

## 2022-12-09 ENCOUNTER — Other Ambulatory Visit: Payer: Self-pay | Admitting: Family Medicine

## 2022-12-09 DIAGNOSIS — E1140 Type 2 diabetes mellitus with diabetic neuropathy, unspecified: Secondary | ICD-10-CM

## 2022-12-09 DIAGNOSIS — E1169 Type 2 diabetes mellitus with other specified complication: Secondary | ICD-10-CM

## 2022-12-19 DIAGNOSIS — M5412 Radiculopathy, cervical region: Secondary | ICD-10-CM | POA: Diagnosis not present

## 2022-12-30 ENCOUNTER — Other Ambulatory Visit: Payer: Self-pay | Admitting: Neurosurgery

## 2023-01-07 NOTE — Progress Notes (Signed)
Surgical Instructions   Your procedure is scheduled on Tuesday January 21, 2023. Report to [facility] Main Entrance "A" at 10:00 A.M., then check in with the Admitting office. Any questions or running late day of surgery: call [phone]  Questions prior to your surgery date: call [phone], Monday-Friday, 8am-4pm. If you experience any cold or flu symptoms such as cough, fever, chills, shortness of breath, etc. between now and your scheduled surgery, please notify us at the above number.     Remember:  Do not eat after midnight the night before your surgery  You may drink clear liquids until 9:00 the morning of your surgery.   Clear liquids allowed are: Water, Non-Citrus Juices (without pulp), Carbonated Beverages, Clear Tea (no milk, honey, etc.), Black Coffee Only (NO MILK, CREAM OR POWDERED CREAMER of any kind), and Gatorade.    Take these medicines the morning of surgery with A SIP OF WATER  cetirizine (ZYRTEC)  fluticasone (FLONASE)  omeprazole (PRILOSEC)  pravastatin (PRAVACHOL)   DO NOT TAKE YOUR Empagliflozin-linaGLIPtin (GLYXAMBI) 72 HOURS PRIOR TO SURGERY, WITH THE LAST DOSE BEING 01/17/2023.    One week prior to surgery, STOP taking any Aspirin (unless otherwise instructed by your surgeon) Aleve, Naproxen, Ibuprofen, Motrin, Advil, Goody's, BC's, all herbal medications, fish oil, and non-prescription vitamins.                     Do NOT Smoke (Tobacco/Vaping) for 24 hours prior to your procedure.  If you use a CPAP at night, you may bring your mask/headgear for your overnight stay.   You will be asked to remove any contacts, glasses, piercing's, hearing aid's, dentures/partials prior to surgery. Please bring cases for these items if needed.    Patients discharged the day of surgery will not be allowed to drive home, and someone needs to stay with them for 24 hours.  SURGICAL WAITING ROOM VISITATION Patients may have no more than 2 support people in the waiting  area - these visitors may rotate.   Pre-op nurse will coordinate an appropriate time for 1 ADULT support person, who may not rotate, to accompany patient in pre-op.  Children under the age of 41 must have an adult with them who is not the patient and must remain in the main waiting area with an adult.  If the patient needs to stay at the hospital during part of their recovery, the visitor guidelines for inpatient rooms apply.  Please refer to the [facility] website for the visitor guidelines for any additional information.   If you received a COVID test during your pre-op visit  it is requested that you wear a mask when out in public, stay away from anyone that may not be feeling well and notify your surgeon if you develop symptoms. If you have been in contact with anyone that has tested positive in the last 10 days please notify you surgeon.      Pre-operative 5 CHG Bathing Instructions   You can play a key role in reducing the risk of infection after surgery. Your skin needs to be as free of germs as possible. You can reduce the number of germs on your skin by washing with CHG (chlorhexidine gluconate) soap before surgery. CHG is an antiseptic soap that kills germs and continues to kill germs even after washing.   DO NOT use if you have an allergy to chlorhexidine/CHG or antibacterial soaps. If your skin becomes reddened or irritated, stop using the CHG and notify  one of our RNs at [phone].   Please shower with the CHG soap starting 4 days before surgery using the following schedule:     Please keep in mind the following:  DO NOT shave, including legs and underarms, starting the day of your first shower.   You may shave your face at any point before/day of surgery.  Place clean sheets on your bed the day you start using CHG soap. Use a clean washcloth (not used since being washed) for each shower. DO NOT sleep with pets once you start using the CHG.   CHG Shower Instructions:   Wash your face and private area with normal soap. If you choose to wash your hair, wash first with your normal shampoo.  After you use shampoo/soap, rinse your hair and body thoroughly to remove shampoo/soap residue.  Turn the water OFF and apply about 3 tablespoons (45 ml) of CHG soap to a CLEAN washcloth.  Apply CHG soap ONLY FROM YOUR NECK DOWN TO YOUR TOES (washing for 3-5 minutes)  DO NOT use CHG soap on face, private areas, open wounds, or sores.  Pay special attention to the area where your surgery is being performed.  If you are having back surgery, having someone wash your back for you may be helpful. Wait 2 minutes after CHG soap is applied, then you may rinse off the CHG soap.  Pat dry with a clean towel  Put on clean clothes/pajamas   If you choose to wear lotion, please use ONLY the CHG-compatible lotions listed below.    Additional instructions for the day of surgery: DO NOT APPLY any lotions, deodorants or cologne.    Do not bring valuables to the hospital. [facility] is not responsible for any belongings/valuables. Do not wear jewelry  Put on clean/comfortable clothes.  Please brush your teeth.  Ask your nurse before applying any prescription medications to the skin.     CHG Compatible Lotions   Aveeno Moisturizing lotion  Cetaphil Moisturizing Cream  Cetaphil Moisturizing Lotion  Clairol Herbal Essence Moisturizing Lotion, Dry Skin  Clairol Herbal Essence Moisturizing Lotion, Extra Dry Skin  Clairol Herbal Essence Moisturizing Lotion, Normal Skin  Curel Age Defying Therapeutic Moisturizing Lotion with Alpha Hydroxy  Curel Extreme Care Body Lotion  Curel Soothing Hands Moisturizing Hand Lotion  Curel Therapeutic Moisturizing Cream, Fragrance-Free  Curel Therapeutic Moisturizing Lotion, Fragrance-Free  Curel Therapeutic Moisturizing Lotion, Original Formula  Eucerin Daily Replenishing Lotion  Eucerin Dry Skin Therapy Plus Alpha Hydroxy Crme  Eucerin Dry Skin  Therapy Plus Alpha Hydroxy Lotion  Eucerin Original Crme  Eucerin Original Lotion  Eucerin Plus Crme Eucerin Plus Lotion  Eucerin TriLipid Replenishing Lotion  Keri Anti-Bacterial Hand Lotion  Keri Deep Conditioning Original Lotion Dry Skin Formula Softly Scented  Keri Deep Conditioning Original Lotion, Fragrance Free Sensitive Skin Formula  Keri Lotion Fast Absorbing Fragrance Free Sensitive Skin Formula  Keri Lotion Fast Absorbing Softly Scented Dry Skin Formula  Keri Original Lotion  Keri Skin Renewal Lotion Keri Silky Smooth Lotion  Keri Silky Smooth Sensitive Skin Lotion  Nivea Body Creamy Conditioning Oil  Nivea Body Extra Enriched Lotion  Nivea Body Original Lotion  Nivea Body Sheer Moisturizing Lotion Nivea Crme  Nivea Skin Firming Lotion  NutraDerm 30 Skin Lotion  NutraDerm Skin Lotion  NutraDerm Therapeutic Skin Cream  NutraDerm Therapeutic Skin Lotion  ProShield Protective Hand Cream  Provon moisturizing lotion  Please read over the following fact sheets that you were given.

## 2023-01-10 ENCOUNTER — Other Ambulatory Visit: Payer: Self-pay

## 2023-01-10 ENCOUNTER — Encounter (HOSPITAL_COMMUNITY): Payer: Self-pay

## 2023-01-10 ENCOUNTER — Encounter (HOSPITAL_COMMUNITY)
Admission: RE | Admit: 2023-01-10 | Discharge: 2023-01-10 | Disposition: A | Discharge status: Home / Self Care | Payer: Medicaid Other | Source: Ambulatory Visit | Attending: Neurosurgery

## 2023-01-10 VITALS — BP 130/89 | HR 107 | Temp 98.4°F | Resp 18 | Ht 74.0 in | Wt 193.3 lb

## 2023-01-10 DIAGNOSIS — M4802 Spinal stenosis, cervical region: Secondary | ICD-10-CM | POA: Diagnosis not present

## 2023-01-10 DIAGNOSIS — Z8619 Personal history of other infectious and parasitic diseases: Secondary | ICD-10-CM | POA: Diagnosis not present

## 2023-01-10 DIAGNOSIS — E1140 Type 2 diabetes mellitus with diabetic neuropathy, unspecified: Secondary | ICD-10-CM | POA: Diagnosis not present

## 2023-01-10 DIAGNOSIS — Z01818 Encounter for other preprocedural examination: Secondary | ICD-10-CM | POA: Diagnosis not present

## 2023-01-10 DIAGNOSIS — I10 Essential (primary) hypertension: Secondary | ICD-10-CM | POA: Diagnosis not present

## 2023-01-10 DIAGNOSIS — E7800 Pure hypercholesterolemia, unspecified: Secondary | ICD-10-CM | POA: Diagnosis not present

## 2023-01-10 HISTORY — DX: Inflammatory liver disease, unspecified: K75.9

## 2023-01-10 LAB — COMPREHENSIVE METABOLIC PANEL
ALT: 29 U/L (ref 0–44)
AST: 20 U/L (ref 15–41)
Albumin: 4.6 g/dL (ref 3.5–5.0)
Alkaline Phosphatase: 48 U/L (ref 38–126)
Anion gap: 14 (ref 5–15)
BUN: 18 mg/dL (ref 6–20)
CO2: 23 mmol/L (ref 22–32)
Calcium: 10.3 mg/dL (ref 8.9–10.3)
Chloride: 99 mmol/L (ref 98–111)
Creatinine, Ser: 1.05 mg/dL (ref 0.61–1.24)
GFR, Estimated: >60 mL/min (ref >60)
Glucose, Bld: 142 mg/dL — ABNORMAL HIGH (ref 70–99)
Potassium: 4.6 mmol/L (ref 3.5–5.1)
Sodium: 136 mmol/L (ref 135–145)
Total Bilirubin: 0.7 mg/dL (ref <1.2)
Total Protein: 7.8 g/dL (ref 6.5–8.1)

## 2023-01-10 LAB — CBC
HCT: 52.4 % — ABNORMAL HIGH (ref 39.0–52.0)
Hemoglobin: 17.6 g/dL — ABNORMAL HIGH (ref 13.0–17.0)
MCH: 30.4 pg (ref 26.0–34.0)
MCHC: 33.6 g/dL (ref 30.0–36.0)
MCV: 90.5 fL (ref 80.0–100.0)
Platelets: 312 10*3/uL (ref 150–400)
RBC: 5.79 MIL/uL (ref 4.22–5.81)
RDW: 12 % (ref 11.5–15.5)
WBC: 12.6 10*3/uL — ABNORMAL HIGH (ref 4.0–10.5)
nRBC: 0 % (ref 0.0–0.2)

## 2023-01-10 LAB — SURGICAL PCR SCREEN
MRSA, PCR: NEGATIVE
Staphylococcus aureus: NEGATIVE

## 2023-01-10 LAB — GLUCOSE, CAPILLARY: Glucose-Capillary: 130 mg/dL — ABNORMAL HIGH (ref 70–99)
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
## 2023-01-10 NOTE — Progress Notes (Signed)
PCP - Neale Burly Cardiologist - denies  PPM/ICD - denies Device Orders - n/a Rep Notified - n/a  Chest x-ray - denies EKG - 01/10/23 Stress Test - denies ECHO - denies Cardiac Cath - denies  Sleep Study - denies CPAP - n/a  Fasting Blood Sugar - 100-130 Checks Blood Sugar once every few weeks  Last dose of Glyxambi is 01/17/23.    Blood Thinner Instructions: n/a Aspirin Instructions: n/a  ERAS Protcol - clears until 0900 PRE-SURGERY Ensure or G2- n/a  COVID TEST- no   Anesthesia review: yes - abnormal EKG  Patient denies shortness of breath, fever, cough and chest pain at PAT appointment   All instructions explained to the patient, with a verbal understanding of the material. Patient agrees to go over the instructions while at home for a better understanding. Patient also instructed to self quarantine after being tested for COVID-19. The opportunity to ask questions was provided.

## 2023-01-10 NOTE — Progress Notes (Signed)
Surgical Instructions   Your procedure is scheduled on Tuesday, January 7th, 2025. Report to [facility] Main Entrance "A" at 10:00 A.M., then check in with the Admitting office. Any questions or running late day of surgery: call [phone]  Questions prior to your surgery date: call [phone], Monday-Friday, 8am-4pm. If you experience any cold or flu symptoms such as cough, fever, chills, shortness of breath, etc. between now and your scheduled surgery, please notify us at the above number.     Remember:  Do not eat after midnight the night before your surgery  You may drink clear liquids until 9:00 the morning of your surgery.    Clear liquids allowed are: Water, Non-Citrus Juices (without pulp), Carbonated Beverages, Clear Tea (no milk, honey, etc.), Black Coffee Only (NO MILK, CREAM OR POWDERED CREAMER of any kind), and Gatorade.    Take these medicines the morning of surgery with A SIP OF WATER: Cetirizine (Zyrtec) Fluticasone (Flonase) Omeprazole (Prilosec) Pravastatin (Pravachol)  DO NOT TAKE YOUR Empagliflozin-linaGLIPtin (GLYXAMBI) 72 HOURS PRIOR TO SURGERY, WITH THE LAST DOSE BEING 01/17/2023.     One week prior to surgery, STOP taking any Aspirin (unless otherwise instructed by your surgeon) Aleve, Naproxen, Ibuprofen, Motrin, Advil, Goody's, BC's, all herbal medications, fish oil, and non-prescription vitamins.    HOW TO MANAGE YOUR DIABETES BEFORE AND AFTER SURGERY  Why is it important to control my blood sugar before and after surgery? Improving blood sugar levels before and after surgery helps healing and can limit problems. A way of improving blood sugar control is eating a healthy diet by:  Eating less sugar and carbohydrates  Increasing activity/exercise  Talking with your doctor about reaching your blood sugar goals High blood sugars (greater than 180 mg/dL) can raise your risk of infections and slow your recovery, so you will need to focus on  controlling your diabetes during the weeks before surgery. Make sure that the doctor who takes care of your diabetes knows about your planned surgery including the date and location.  How do I manage my blood sugar before surgery? Check your blood sugar at least 4 times a day, starting 2 days before surgery, to make sure that the level is not too high or low.  Check your blood sugar the morning of your surgery when you wake up and every 2 hours until you get to the Short Stay unit.  If your blood sugar is less than 70 mg/dL, you will need to treat for low blood sugar: Do not take insulin. Treat a low blood sugar (less than 70 mg/dL) with  cup of clear juice (cranberry or apple), 4 glucose tablets, OR glucose gel. Recheck blood sugar in 15 minutes after treatment (to make sure it is greater than 70 mg/dL). If your blood sugar is not greater than 70 mg/dL on recheck, call [phone] for further instructions. Report your blood sugar to the short stay nurse when you get to Short Stay.  If you are admitted to the hospital after surgery: Your blood sugar will be checked by the staff and you will probably be given insulin after surgery (instead of oral diabetes medicines) to make sure you have good blood sugar levels. The goal for blood sugar control after surgery is 80-180 mg/dL.                      Do NOT Smoke (Tobacco/Vaping) for 24 hours prior to your procedure.  If you use a CPAP at night, you may  bring your mask/headgear for your overnight stay.   You will be asked to remove any contacts, glasses, piercing's, hearing aid's, dentures/partials prior to surgery. Please bring cases for these items if needed.    Patients discharged the day of surgery will not be allowed to drive home, and someone needs to stay with them for 24 hours.  SURGICAL WAITING ROOM VISITATION Patients may have no more than 2 support people in the waiting area - these visitors may rotate.   Pre-op nurse will  coordinate an appropriate time for 1 ADULT support person, who may not rotate, to accompany patient in pre-op.  Children under the age of 6 must have an adult with them who is not the patient and must remain in the main waiting area with an adult.  If the patient needs to stay at the hospital during part of their recovery, the visitor guidelines for inpatient rooms apply.  Please refer to the [facility] website for the visitor guidelines for any additional information.   If you received a COVID test during your pre-op visit  it is requested that you wear a mask when out in public, stay away from anyone that may not be feeling well and notify your surgeon if you develop symptoms. If you have been in contact with anyone that has tested positive in the last 10 days please notify you surgeon.      Pre-operative 5 CHG Bathing Instructions   You can play a key role in reducing the risk of infection after surgery. Your skin needs to be as free of germs as possible. You can reduce the number of germs on your skin by washing with CHG (chlorhexidine gluconate) soap before surgery. CHG is an antiseptic soap that kills germs and continues to kill germs even after washing.   DO NOT use if you have an allergy to chlorhexidine/CHG or antibacterial soaps. If your skin becomes reddened or irritated, stop using the CHG and notify one of our RNs at [phone].   Please shower with the CHG soap starting 4 days before surgery using the following schedule:     Please keep in mind the following:  DO NOT shave, including legs and underarms, starting the day of your first shower.   You may shave your face at any point before/day of surgery.  Place clean sheets on your bed the day you start using CHG soap. Use a clean washcloth (not used since being washed) for each shower. DO NOT sleep with pets once you start using the CHG.   CHG Shower Instructions:  Wash your face and private area with normal soap. If  you choose to wash your hair, wash first with your normal shampoo.  After you use shampoo/soap, rinse your hair and body thoroughly to remove shampoo/soap residue.  Turn the water OFF and apply about 3 tablespoons (45 ml) of CHG soap to a CLEAN washcloth.  Apply CHG soap ONLY FROM YOUR NECK DOWN TO YOUR TOES (washing for 3-5 minutes)  DO NOT use CHG soap on face, private areas, open wounds, or sores.  Pay special attention to the area where your surgery is being performed.  If you are having back surgery, having someone wash your back for you may be helpful. Wait 2 minutes after CHG soap is applied, then you may rinse off the CHG soap.  Pat dry with a clean towel  Put on clean clothes/pajamas   If you choose to wear lotion, please use ONLY the CHG-compatible lotions  on the back of this paper.   Additional instructions for the day of surgery: DO NOT APPLY any lotions, deodorants, cologne, or perfumes.   Do not bring valuables to the hospital. [facility] is not responsible for any belongings/valuables. Do not wear nail polish, gel polish, artificial nails, or any other type of covering on natural nails (fingers and toes) Do not wear jewelry or makeup Put on clean/comfortable clothes.  Please brush your teeth.  Ask your nurse before applying any prescription medications to the skin.     CHG Compatible Lotions   Aveeno Moisturizing lotion  Cetaphil Moisturizing Cream  Cetaphil Moisturizing Lotion  Clairol Herbal Essence Moisturizing Lotion, Dry Skin  Clairol Herbal Essence Moisturizing Lotion, Extra Dry Skin  Clairol Herbal Essence Moisturizing Lotion, Normal Skin  Curel Age Defying Therapeutic Moisturizing Lotion with Alpha Hydroxy  Curel Extreme Care Body Lotion  Curel Soothing Hands Moisturizing Hand Lotion  Curel Therapeutic Moisturizing Cream, Fragrance-Free  Curel Therapeutic Moisturizing Lotion, Fragrance-Free  Curel Therapeutic Moisturizing Lotion, Original Formula  Eucerin  Daily Replenishing Lotion  Eucerin Dry Skin Therapy Plus Alpha Hydroxy Crme  Eucerin Dry Skin Therapy Plus Alpha Hydroxy Lotion  Eucerin Original Crme  Eucerin Original Lotion  Eucerin Plus Crme Eucerin Plus Lotion  Eucerin TriLipid Replenishing Lotion  Keri Anti-Bacterial Hand Lotion  Keri Deep Conditioning Original Lotion Dry Skin Formula Softly Scented  Keri Deep Conditioning Original Lotion, Fragrance Free Sensitive Skin Formula  Keri Lotion Fast Absorbing Fragrance Free Sensitive Skin Formula  Keri Lotion Fast Absorbing Softly Scented Dry Skin Formula  Keri Original Lotion  Keri Skin Renewal Lotion Keri Silky Smooth Lotion  Keri Silky Smooth Sensitive Skin Lotion  Nivea Body Creamy Conditioning Oil  Nivea Body Extra Enriched Lotion  Nivea Body Original Lotion  Nivea Body Sheer Moisturizing Lotion Nivea Crme  Nivea Skin Firming Lotion  NutraDerm 30 Skin Lotion  NutraDerm Skin Lotion  NutraDerm Therapeutic Skin Cream  NutraDerm Therapeutic Skin Lotion  ProShield Protective Hand Cream  Provon moisturizing lotion  Please read over the following fact sheets that you were given.

## 2023-01-10 NOTE — Anesthesia Preprocedure Evaluation (Signed)
Anesthesia Evaluation    Airway        Dental   Pulmonary Current Smoker and Patient abstained from smoking.          Cardiovascular hypertension,      Neuro/Psych    GI/Hepatic   Endo/Other  diabetes    Renal/GU      Musculoskeletal   Abdominal   Peds  Hematology   Anesthesia Other Findings   Reproductive/Obstetrics                             Anesthesia Physical Anesthesia Plan  ASA:   Anesthesia Plan:    Post-op Pain Management:    Induction:   PONV Risk Score and Plan:   Airway Management Planned:   Additional Equipment:   Intra-op Plan:   Post-operative Plan:   Informed Consent:   Plan Discussed with:   Anesthesia Plan Comments: (PAT note written 01/10/2023 by Shonna Chock, PA-C.  )       Anesthesia Quick Evaluation

## 2023-01-11 LAB — HEMOGLOBIN A1C
Hgb A1c MFr Bld: 6.9 % — ABNORMAL HIGH (ref 4.8–5.6)
Mean Plasma Glucose: 151 mg/dL

## 2023-01-12 ENCOUNTER — Other Ambulatory Visit: Payer: Self-pay | Admitting: Family Medicine

## 2023-01-12 ENCOUNTER — Other Ambulatory Visit: Payer: Self-pay | Admitting: Family

## 2023-01-12 DIAGNOSIS — E1159 Type 2 diabetes mellitus with other circulatory complications: Secondary | ICD-10-CM

## 2023-01-12 DIAGNOSIS — E1140 Type 2 diabetes mellitus with diabetic neuropathy, unspecified: Secondary | ICD-10-CM

## 2023-01-12 DIAGNOSIS — J302 Other seasonal allergic rhinitis: Secondary | ICD-10-CM

## 2023-01-12 DIAGNOSIS — K219 Gastro-esophageal reflux disease without esophagitis: Secondary | ICD-10-CM

## 2023-01-21 ENCOUNTER — Other Ambulatory Visit: Payer: Self-pay

## 2023-01-21 ENCOUNTER — Ambulatory Visit (HOSPITAL_COMMUNITY): Payer: Medicaid Other | Admitting: Vascular Surgery

## 2023-01-21 ENCOUNTER — Encounter (HOSPITAL_COMMUNITY): Payer: Self-pay | Admitting: Neurosurgery

## 2023-01-21 ENCOUNTER — Ambulatory Visit (HOSPITAL_COMMUNITY)
Admission: RE | Disposition: A | Discharge status: Home / Self Care | Payer: Self-pay | Source: Home / Self Care | Attending: Neurosurgery

## 2023-01-21 ENCOUNTER — Ambulatory Visit (HOSPITAL_COMMUNITY): Payer: Medicaid Other

## 2023-01-21 ENCOUNTER — Ambulatory Visit (HOSPITAL_BASED_OUTPATIENT_CLINIC_OR_DEPARTMENT_OTHER): Payer: Self-pay | Admitting: Anesthesiology

## 2023-01-21 ENCOUNTER — Observation Stay (HOSPITAL_COMMUNITY)
Admission: RE | Admit: 2023-01-21 | Discharge: 2023-01-22 | Disposition: A | Discharge status: Home / Self Care | Payer: Medicaid Other | Attending: Neurosurgery | Admitting: Neurosurgery

## 2023-01-21 DIAGNOSIS — E119 Type 2 diabetes mellitus without complications: Secondary | ICD-10-CM

## 2023-01-21 DIAGNOSIS — Z79899 Other long term (current) drug therapy: Secondary | ICD-10-CM | POA: Diagnosis not present

## 2023-01-21 DIAGNOSIS — I10 Essential (primary) hypertension: Secondary | ICD-10-CM | POA: Insufficient documentation

## 2023-01-21 DIAGNOSIS — Z981 Arthrodesis status: Secondary | ICD-10-CM | POA: Diagnosis not present

## 2023-01-21 DIAGNOSIS — M4712 Other spondylosis with myelopathy, cervical region: Principal | ICD-10-CM | POA: Insufficient documentation

## 2023-01-21 DIAGNOSIS — M4802 Spinal stenosis, cervical region: Secondary | ICD-10-CM

## 2023-01-21 DIAGNOSIS — E1140 Type 2 diabetes mellitus with diabetic neuropathy, unspecified: Secondary | ICD-10-CM

## 2023-01-21 DIAGNOSIS — M4722 Other spondylosis with radiculopathy, cervical region: Secondary | ICD-10-CM

## 2023-01-21 DIAGNOSIS — M5412 Radiculopathy, cervical region: Secondary | ICD-10-CM | POA: Diagnosis not present

## 2023-01-21 DIAGNOSIS — F17210 Nicotine dependence, cigarettes, uncomplicated: Secondary | ICD-10-CM | POA: Insufficient documentation

## 2023-01-21 DIAGNOSIS — F17200 Nicotine dependence, unspecified, uncomplicated: Secondary | ICD-10-CM | POA: Diagnosis not present

## 2023-01-21 DIAGNOSIS — M50022 Cervical disc disorder at C5-C6 level with myelopathy: Secondary | ICD-10-CM | POA: Diagnosis not present

## 2023-01-21 HISTORY — PX: ANTERIOR CERVICAL DECOMP/DISCECTOMY FUSION: SHX1161

## 2023-01-21 LAB — GLUCOSE, CAPILLARY
Glucose-Capillary: 174 mg/dL — ABNORMAL HIGH (ref 70–99)
Glucose-Capillary: 138 mg/dL — ABNORMAL HIGH (ref 70–99)
Glucose-Capillary: 126 mg/dL — ABNORMAL HIGH (ref 70–99)
Glucose-Capillary: 172 mg/dL — ABNORMAL HIGH (ref 70–99)
Glucose-Capillary: 182 mg/dL — ABNORMAL HIGH (ref 70–99)

## 2023-01-21 SURGERY — ANTERIOR CERVICAL DECOMPRESSION/DISCECTOMY FUSION 2 LEVELS
Anesthesia: General | Site: Neck

## 2023-01-21 MED ORDER — DROPERIDOL 2.5 MG/ML IJ SOLN: 0.6250 mg | Freq: Once | INTRAMUSCULAR | Status: DC | PRN

## 2023-01-21 MED ORDER — HYDROCODONE-ACETAMINOPHEN 10-325 MG PO TABS: 1.0000 | ORAL_TABLET | ORAL | Status: DC | PRN

## 2023-01-21 MED ORDER — SODIUM CHLORIDE 0.9% FLUSH: 3.0000 mL | INTRAVENOUS | Status: DC | PRN

## 2023-01-21 MED ORDER — CEFAZOLIN SODIUM-DEXTROSE 1-4 GM/50ML-% IV SOLN
1.0000 g | Freq: Three times a day (TID) | INTRAVENOUS | Status: AC
  Administered 2023-01-21 – 2023-01-22 (×2): 1 g via INTRAVENOUS
  Filled 2023-01-21 (×2): qty 50

## 2023-01-21 MED ORDER — PHENOL 1.4 % MT LIQD: 1.0000 | OROMUCOSAL | Status: DC | PRN

## 2023-01-21 MED ORDER — SODIUM CHLORIDE 0.9% FLUSH
3.0000 mL | Freq: Two times a day (BID) | INTRAVENOUS | Status: DC
  Administered 2023-01-21 (×2): 3 mL via INTRAVENOUS

## 2023-01-21 MED ORDER — HYDROCODONE-ACETAMINOPHEN 5-325 MG PO TABS: 1.0000 | ORAL_TABLET | ORAL | Status: DC | PRN

## 2023-01-21 MED ORDER — ORAL CARE MOUTH RINSE: 15.0000 mL | Freq: Once | OROMUCOSAL | Status: AC

## 2023-01-21 MED ORDER — LACTATED RINGERS IV SOLN: INTRAVENOUS | Status: DC | PRN

## 2023-01-21 MED ORDER — LIDOCAINE 2% (20 MG/ML) 5 ML SYRINGE
INTRAMUSCULAR | Status: DC | PRN
  Administered 2023-01-21: 80 mg via INTRAVENOUS

## 2023-01-21 MED ORDER — FLUTICASONE PROPIONATE 50 MCG/ACT NA SUSP
2.0000 | Freq: Every day | NASAL | Status: DC
  Filled 2023-01-21: qty 16

## 2023-01-21 MED ORDER — THROMBIN (RECOMBINANT) 5000 UNITS EX SOLR: CUTANEOUS | Status: DC | PRN

## 2023-01-21 MED ORDER — HYDROCODONE-ACETAMINOPHEN 10-325 MG PO TABS
1.0000 | ORAL_TABLET | ORAL | Status: DC | PRN
  Administered 2023-01-21: 1 via ORAL
  Filled 2023-01-21: qty 1

## 2023-01-21 MED ORDER — ALBUMIN HUMAN 5 % IV SOLN: INTRAVENOUS | Status: DC | PRN

## 2023-01-21 MED ORDER — THROMBIN 5000 UNITS EX SOLR
CUTANEOUS | Status: AC
  Filled 2023-01-21: qty 10000

## 2023-01-21 MED ORDER — ACETAMINOPHEN 325 MG PO TABS: 650.0000 mg | ORAL_TABLET | ORAL | Status: DC | PRN

## 2023-01-21 MED ORDER — PHENYLEPHRINE 80 MCG/ML (10ML) SYRINGE FOR IV PUSH (FOR BLOOD PRESSURE SUPPORT)
PREFILLED_SYRINGE | INTRAVENOUS | Status: DC | PRN
  Administered 2023-01-21: 80 ug via INTRAVENOUS
  Administered 2023-01-21: 160 ug via INTRAVENOUS
  Administered 2023-01-21: 80 ug via INTRAVENOUS

## 2023-01-21 MED ORDER — MONTELUKAST SODIUM 10 MG PO TABS
10.0000 mg | ORAL_TABLET | Freq: Every morning | ORAL | Status: DC
  Administered 2023-01-22: 10 mg via ORAL
  Filled 2023-01-21: qty 1

## 2023-01-21 MED ORDER — ROCURONIUM BROMIDE 10 MG/ML (PF) SYRINGE
PREFILLED_SYRINGE | INTRAVENOUS | Status: DC | PRN
  Administered 2023-01-21: 60 mg via INTRAVENOUS
  Administered 2023-01-21: 30 mg via INTRAVENOUS

## 2023-01-21 MED ORDER — ONDANSETRON HCL 4 MG/2ML IJ SOLN
INTRAMUSCULAR | Status: DC | PRN
  Administered 2023-01-21: 4 mg via INTRAVENOUS

## 2023-01-21 MED ORDER — INSULIN ASPART 100 UNIT/ML IJ SOLN: 0.0000 [IU] | INTRAMUSCULAR | Status: DC | PRN

## 2023-01-21 MED ORDER — SUGAMMADEX SODIUM 200 MG/2ML IV SOLN
INTRAVENOUS | Status: DC | PRN
  Administered 2023-01-21: 177 mg via INTRAVENOUS

## 2023-01-21 MED ORDER — PANTOPRAZOLE SODIUM 40 MG PO TBEC
40.0000 mg | DELAYED_RELEASE_TABLET | Freq: Every day | ORAL | Status: DC
  Administered 2023-01-22: 40 mg via ORAL
  Filled 2023-01-21: qty 1

## 2023-01-21 MED ORDER — SENNA 8.6 MG PO TABS
1.0000 | ORAL_TABLET | Freq: Two times a day (BID) | ORAL | Status: DC | PRN
  Administered 2023-01-21: 8.6 mg via ORAL
  Filled 2023-01-21: qty 1

## 2023-01-21 MED ORDER — DEXAMETHASONE SODIUM PHOSPHATE 10 MG/ML IJ SOLN
INTRAMUSCULAR | Status: DC | PRN
  Administered 2023-01-21: 10 mg via INTRAVENOUS

## 2023-01-21 MED ORDER — MIDAZOLAM HCL 2 MG/2ML IJ SOLN
INTRAMUSCULAR | Status: AC
Start: 2023-01-21 — End: ?
  Filled 2023-01-21: qty 2

## 2023-01-21 MED ORDER — ONDANSETRON HCL 4 MG PO TABS: 4.0000 mg | ORAL_TABLET | Freq: Four times a day (QID) | ORAL | Status: DC | PRN

## 2023-01-21 MED ORDER — CHLORHEXIDINE GLUCONATE 0.12 % MT SOLN
15.0000 mL | Freq: Once | OROMUCOSAL | Status: AC
  Administered 2023-01-21: 15 mL via OROMUCOSAL
  Filled 2023-01-21: qty 15

## 2023-01-21 MED ORDER — CYCLOBENZAPRINE HCL 10 MG PO TABS
10.0000 mg | ORAL_TABLET | Freq: Three times a day (TID) | ORAL | Status: DC | PRN
  Administered 2023-01-21: 10 mg via ORAL
  Filled 2023-01-21 (×2): qty 1

## 2023-01-21 MED ORDER — FENTANYL CITRATE (PF) 250 MCG/5ML IJ SOLN
INTRAMUSCULAR | Status: AC
  Filled 2023-01-21: qty 5

## 2023-01-21 MED ORDER — ACETAMINOPHEN 650 MG RE SUPP
650.0000 mg | RECTAL | Status: DC | PRN
Start: 2023-01-21 — End: 2023-01-22

## 2023-01-21 MED ORDER — 0.9 % SODIUM CHLORIDE (POUR BTL) OPTIME
TOPICAL | Status: DC | PRN
  Administered 2023-01-21: 1000 mL

## 2023-01-21 MED ORDER — ONDANSETRON HCL 4 MG/2ML IJ SOLN: 4.0000 mg | Freq: Four times a day (QID) | INTRAMUSCULAR | Status: DC | PRN

## 2023-01-21 MED ORDER — CEFAZOLIN SODIUM-DEXTROSE 2-4 GM/100ML-% IV SOLN
2.0000 g | INTRAVENOUS | Status: AC
  Administered 2023-01-21: 2 g via INTRAVENOUS
  Filled 2023-01-21: qty 100

## 2023-01-21 MED ORDER — MENTHOL 3 MG MT LOZG: 1.0000 | LOZENGE | OROMUCOSAL | Status: DC | PRN

## 2023-01-21 MED ORDER — FENTANYL CITRATE (PF) 250 MCG/5ML IJ SOLN
INTRAMUSCULAR | Status: DC | PRN
  Administered 2023-01-21: 100 ug via INTRAVENOUS
  Administered 2023-01-21: 50 ug via INTRAVENOUS

## 2023-01-21 MED ORDER — ACETAMINOPHEN 500 MG PO TABS
1000.0000 mg | ORAL_TABLET | Freq: Once | ORAL | Status: AC
  Administered 2023-01-21: 1000 mg via ORAL
  Filled 2023-01-21: qty 2

## 2023-01-21 MED ORDER — PHENYLEPHRINE HCL-NACL 20-0.9 MG/250ML-% IV SOLN
INTRAVENOUS | Status: DC | PRN
  Administered 2023-01-21: 30 ug/min via INTRAVENOUS

## 2023-01-21 MED ORDER — HYDROCODONE-ACETAMINOPHEN 5-325 MG PO TABS
1.0000 | ORAL_TABLET | ORAL | Status: DC | PRN
  Administered 2023-01-21 – 2023-01-22 (×2): 1 via ORAL
  Filled 2023-01-21 (×3): qty 1

## 2023-01-21 MED ORDER — HYDROMORPHONE HCL 1 MG/ML IJ SOLN
0.2500 mg | INTRAMUSCULAR | Status: DC | PRN
  Administered 2023-01-21: 0.25 mg via INTRAVENOUS

## 2023-01-21 MED ORDER — CHLORHEXIDINE GLUCONATE CLOTH 2 % EX PADS: 6.0000 | MEDICATED_PAD | Freq: Once | CUTANEOUS | Status: DC

## 2023-01-21 MED ORDER — LISINOPRIL 20 MG PO TABS
20.0000 mg | ORAL_TABLET | Freq: Every day | ORAL | Status: DC
  Administered 2023-01-21 – 2023-01-22 (×2): 20 mg via ORAL
  Filled 2023-01-21 (×2): qty 1

## 2023-01-21 MED ORDER — LORATADINE 10 MG PO TABS
10.0000 mg | ORAL_TABLET | Freq: Every day | ORAL | Status: DC
  Administered 2023-01-22: 10 mg via ORAL
  Filled 2023-01-21: qty 1

## 2023-01-21 MED ORDER — HYDROMORPHONE HCL 1 MG/ML IJ SOLN
INTRAMUSCULAR | Status: AC
  Filled 2023-01-21: qty 1

## 2023-01-21 MED ORDER — SODIUM CHLORIDE 0.9 % IV SOLN
250.0000 mL | INTRAVENOUS | Status: DC
  Administered 2023-01-21: 250 mL via INTRAVENOUS

## 2023-01-21 MED ORDER — MIDAZOLAM HCL 2 MG/2ML IJ SOLN
INTRAMUSCULAR | Status: DC | PRN
  Administered 2023-01-21: 2 mg via INTRAVENOUS

## 2023-01-21 MED ORDER — PRAVASTATIN SODIUM 40 MG PO TABS
40.0000 mg | ORAL_TABLET | Freq: Every day | ORAL | Status: DC
  Administered 2023-01-22: 40 mg via ORAL
  Filled 2023-01-21: qty 1

## 2023-01-21 MED ORDER — DOCUSATE SODIUM 100 MG PO CAPS
100.0000 mg | ORAL_CAPSULE | Freq: Two times a day (BID) | ORAL | Status: DC
  Administered 2023-01-21 – 2023-01-22 (×2): 100 mg via ORAL
  Filled 2023-01-21 (×2): qty 1

## 2023-01-21 MED ORDER — MEPERIDINE HCL 25 MG/ML IJ SOLN: 6.2500 mg | INTRAMUSCULAR | Status: DC | PRN

## 2023-01-21 MED ORDER — HYDROMORPHONE HCL 1 MG/ML IJ SOLN: 1.0000 mg | INTRAMUSCULAR | Status: DC | PRN

## 2023-01-21 MED ORDER — PROPOFOL 10 MG/ML IV BOLUS
INTRAVENOUS | Status: AC
Start: 2023-01-21 — End: ?
  Filled 2023-01-21: qty 20

## 2023-01-21 MED ORDER — PROPOFOL 10 MG/ML IV BOLUS
INTRAVENOUS | Status: DC | PRN
  Administered 2023-01-21: 200 mg via INTRAVENOUS

## 2023-01-21 MED ORDER — GABAPENTIN 300 MG PO CAPS
300.0000 mg | ORAL_CAPSULE | Freq: Once | ORAL | Status: AC
  Administered 2023-01-21: 300 mg via ORAL
  Filled 2023-01-21: qty 1

## 2023-01-21 SURGICAL SUPPLY — 45 items
BAG COUNTER SPONGE SURGICOUNT (BAG) ×1 IMPLANT
BENZOIN TINCTURE PRP APPL 2/3 (GAUZE/BANDAGES/DRESSINGS) ×1 IMPLANT
BIT DRILL 13 (BIT) IMPLANT
BUR MATCHSTICK NEURO 3.0 LAGG (BURR) ×1 IMPLANT
CAGE PEEK 8X14X11 (Cage) IMPLANT
CANISTER SUCT 3000ML PPV (MISCELLANEOUS) ×1 IMPLANT
DRAPE C-ARM 42X72 X-RAY (DRAPES) ×2 IMPLANT
DRAPE LAPAROTOMY 100X72 PEDS (DRAPES) ×1 IMPLANT
DRAPE MICROSCOPE SLANT 54X150 (MISCELLANEOUS) ×1 IMPLANT
DURAPREP 6ML APPLICATOR 50/CS (WOUND CARE) ×1 IMPLANT
ELECT COATED BLADE 2.86 ST (ELECTRODE) ×1 IMPLANT
ELECT REM PT RETURN 9FT ADLT (ELECTROSURGICAL) ×1
ELECTRODE REM PT RTRN 9FT ADLT (ELECTROSURGICAL) ×1 IMPLANT
GAUZE 4X4 16PLY ~~LOC~~+RFID DBL (SPONGE) IMPLANT
GAUZE SPONGE 4X4 12PLY STRL (GAUZE/BANDAGES/DRESSINGS) ×1 IMPLANT
GLOVE ECLIPSE 9.0 STRL (GLOVE) ×1 IMPLANT
GLOVE EXAM NITRILE XL STR (GLOVE) IMPLANT
GOWN STRL REUS W/ TWL LRG LVL3 (GOWN DISPOSABLE) IMPLANT
GOWN STRL REUS W/ TWL XL LVL3 (GOWN DISPOSABLE) ×1 IMPLANT
GOWN STRL REUS W/TWL 2XL LVL3 (GOWN DISPOSABLE) IMPLANT
HALTER HD/CHIN CERV TRACTION D (MISCELLANEOUS) ×1 IMPLANT
HEMOSTAT POWDER KIT SURGIFOAM (HEMOSTASIS) IMPLANT
HEMOSTAT SURGICEL 2X14 (HEMOSTASIS) IMPLANT
KIT BASIN OR (CUSTOM PROCEDURE TRAY) ×1 IMPLANT
KIT TURNOVER KIT B (KITS) ×1 IMPLANT
NDL SPNL 20GX3.5 QUINCKE YW (NEEDLE) ×1 IMPLANT
NEEDLE SPNL 20GX3.5 QUINCKE YW (NEEDLE) ×1
NS IRRIG 1000ML POUR BTL (IV SOLUTION) ×1 IMPLANT
PACK LAMINECTOMY NEURO (CUSTOM PROCEDURE TRAY) ×1 IMPLANT
PAD ARMBOARD 7.5X6 YLW CONV (MISCELLANEOUS) ×3 IMPLANT
PLATE 45XATL VS ELT (Plate) IMPLANT
SCREW ST 13X4XST VA NS SPNE (Screw) IMPLANT
SPACER SPNL 11X14X7XPEEK CVD (Cage) IMPLANT
SPCR SPNL 11X14X7XPEEK CVD (Cage) ×1 IMPLANT
SPONGE INTESTINAL PEANUT (DISPOSABLE) ×1 IMPLANT
SPONGE SURGIFOAM ABS GEL SZ50 (HEMOSTASIS) ×1 IMPLANT
STRIP CLOSURE SKIN 1/2X4 (GAUZE/BANDAGES/DRESSINGS) ×1 IMPLANT
SUT VIC AB 3-0 SH 8-18 (SUTURE) ×1 IMPLANT
SUT VIC AB 4-0 RB1 18 (SUTURE) ×1 IMPLANT
TAPE CLOTH 4X10 WHT NS (GAUZE/BANDAGES/DRESSINGS) ×1 IMPLANT
TAPE CLOTH SURG 4X10 WHT LF (GAUZE/BANDAGES/DRESSINGS) IMPLANT
TOWEL GREEN STERILE (TOWEL DISPOSABLE) ×1 IMPLANT
TOWEL GREEN STERILE FF (TOWEL DISPOSABLE) ×1 IMPLANT
TRAP SPECIMEN MUCUS 40CC (MISCELLANEOUS) ×1 IMPLANT
WATER STERILE IRR 1000ML POUR (IV SOLUTION) ×1 IMPLANT

## 2023-01-21 NOTE — Anesthesia Postprocedure Evaluation (Signed)
 Anesthesia Post Note  Patient: Oscar Cox  Procedure(s) Performed: Anterior Cervical Discectomy and Fusion Cervical five-six, Cervical six-seven (Neck)     Patient location during evaluation: PACU Anesthesia Type: General Level of consciousness: sedated and patient cooperative Pain management: pain level controlled Vital Signs Assessment: post-procedure vital signs reviewed and stable Respiratory status: spontaneous breathing Cardiovascular status: stable Anesthetic complications: no   No notable events documented.  Last Vitals:  Vitals:   01/21/23 1515 01/21/23 1542  BP: (!) 147/93 (!) 151/94  Pulse: 86 97  Resp: 12 20  Temp: 36.6 C 36.7 C  SpO2: 95% 97%    Last Pain:  Vitals:   01/21/23 1622  PainSc: 7                  Willowdean L[employer]

## 2023-01-21 NOTE — Transfer of Care (Signed)
 Immediate Anesthesia Transfer of Care Note  Patient: Oscar Cox  Procedure(s) Performed: Anterior Cervical Discectomy and Fusion Cervical five-six, Cervical six-seven (Neck)  Patient Location: PACU  Anesthesia Type:General  Level of Consciousness: sedated  Airway & Oxygen Therapy: Patient connected to face mask oxygen  Post-op Assessment: Post -op Vital signs reviewed and stable  Post vital signs: stable  Last Vitals:  Vitals Value Taken Time  BP 128/98 01/21/23 1430  Temp    Pulse 84 01/21/23 1431  Resp 19 01/21/23 1431  SpO2 100 % 01/21/23 1431  Vitals shown include unfiled device data.  Last Pain:  Vitals:   01/21/23 1100  PainSc: 5       Patients Stated Pain Goal: 3 (01/21/23 1100)  Complications: No notable events documented.

## 2023-01-21 NOTE — H&P (Signed)
 Oscar Cox is an 48 y.o. male.   Chief Complaint: Neck pain HPI: 48 year old male with chronic and progressively worsening neck pain with bilateral radicular symptoms and symptoms of early cervical myelopathy.  Workup demonstrates evidence of significant disc degeneration with broad-based disc herniation and stenosis at C5-6 and C6-7.  Patient presents now for two-level anterior cervical decompression and fusion in hopes of improving his symptoms. Past Medical History:  Diagnosis Date   Acid reflux    Diabetes mellitus without complication (HCC)    Essential hypertension, benign 11/27/2016   Hepatitis    Hepatitis C virus infection cured after antiviral drug therapy 09/24/2018   High cholesterol 11/27/2016    Past Surgical History:  Procedure Laterality Date   COLONOSCOPY WITH PROPOFOL  N/A 03/21/2020   Castaneda:normal ileum, diverticulosis in sigmoid colon, descending colon and ascending colon. Non bleeding internal hemorrhoids, no specimens collected.   FOOT SURGERY Left    FOOT SURGERY Right     Family History  Problem Relation Age of Onset   Hypertension Mother    Breast cancer Mother    Diabetes Father    Heart disease Father    Hypertension Father    Colon cancer Father    Diabetes Brother    Hypertension Brother    Thyroid  cancer Brother        Anaplastic   Social History:  reports that he has been smoking cigarettes. He has never used smokeless tobacco. He reports current alcohol use. He reports current drug use. Frequency: 2.00 times per week. Drug: Marijuana.  Allergies:  Allergies  Allergen Reactions   Tamiflu [Oseltamivir Phosphate] Hives   Metformin  And Related Diarrhea   Morphine  Nausea Only   Potassium Chloride Nausea And Vomiting    Medications Prior to Admission  Medication Sig Dispense Refill   cetirizine  (ZYRTEC ) 10 MG tablet Take 1 tablet (10 mg total) by mouth daily. 90 tablet 2   fluticasone  (FLONASE ) 50 MCG/ACT nasal spray Use 2  spray(s) in each nostril once daily 48 g 1   GLYXAMBI  25-5 MG TABS Take 1 tablet by mouth once daily 90 tablet 1   lisinopril  (ZESTRIL ) 20 MG tablet Take 1 tablet by mouth once daily 90 tablet 1   montelukast  (SINGULAIR ) 10 MG tablet TAKE 1 TABLET BY MOUTH AT BEDTIME (Patient taking differently: Take 10 mg by mouth in the morning.) 90 tablet 1   omeprazole  (PRILOSEC ) 20 MG capsule Take 1 capsule by mouth once daily 90 capsule 1   pravastatin  (PRAVACHOL ) 40 MG tablet Take 1 tablet by mouth once daily 90 tablet 1    Results for orders placed or performed during the hospital encounter of 01/21/23 (from the past 48 hours)  Glucose, capillary     Status: Abnormal   Collection Time: 01/21/23 10:15 AM  Result Value Ref Range   Glucose-Capillary 174 (H) 70 - 99 mg/dL    Comment: Glucose reference range applies only to samples taken after fasting for at least 8 hours.   No results found.  Pertinent items noted in HPI and remainder of comprehensive ROS otherwise negative.  Blood pressure 101/78, pulse 98, temperature 97.8 F (36.6 C), resp. rate 17, height 6' 2 (1.88 m), weight 88.5 kg, SpO2 95%.  Patient is awake and alert.  He is oriented and appropriate.  Speech is fluent.  Judgment insight are intact.  Cranial nerve function normal by.  Motor examination reveals intact motor strength bilaterally sensory examination some patchy distal sensory loss in both upper extremities.  Reflexes are normal active.  Gait is normal peer examination head ears eyes nose and throat is unremarked.  Chest and abdomen are benign.  Extremities are free from injury deformity. 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  Plan C5-6, C6-7 anterior cervical discectomy and fusion.  Risks and benefits been explained.  Patient wishes proceed.  Victory A Elizandro Laura 01/21/2023, 11:26 AM

## 2023-01-21 NOTE — Op Note (Signed)
 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  Asst. Surgeon: Dawley,DO  Anesthesia: General  Indication: 48 year old male with chronic and progressively worsening neck and bilateral upper extremity symptoms failing conservative management workup demonstrates evidence of marked disc generation with broad-based disc protrusions and associated spondylosis with significant spinal stenosis with ongoing cord compression.  On the right side at C6-7 the patient also has a caudally migrated disc herniation further leading to his symptoms.  Patient has failed conservative management presents now for two-level anterior cervical decompression and fusion in hopes improving his symptoms.  Operative note: After induction of anesthesia, patient positioned supine with Extended and held placeholder traction.  Patient's anterior cervical region prepped draped sterilely.  Incision made overlying C6.  Dissection performed on the right.  Retractor placed.  Fluoroscopy used.  Levels confirmed.  Disc bases at C5-6 and C6-7 were incised.  Discectomy was then performed using various instruments down to level the posterior annulus.  Microscope was then brought to the field used throughout the remainder of the disc edge.  Remaining aspects of annulus and osteophytes were removed using high-speed drill down to level the posterior logical limb.  Posterior logical ligament was elevated and resected.  Underlying thecal sac was identified.  Wide central decompression then performed undercutting the bodies of C5 and C6.  Decompression then proceeded each neural foramina.   Wide anterior foraminotomies performed on the course exiting C6 nerve roots bilaterally.  Procedures then repeated at C6-7 again without complications.  Findings at C6-7 were that of significant spondylosis as well as a free disc herniation on the right side which was resected.  Wound was irrigated.  [employer] anatomic peek cages were then packed with locally harvested autograft.  Cage was then packed into place at both levels and recessed slightly from the anterior cortical margin.  Atlantis anterior cervical plate was then placed over the C5, C6 and C7 levels.  This then attached under fluoroscopic guidance using 13 mm variable angle screws to each at all 3 levels.  All screws given final tightening found to be solidly within the bone.  Locking screws engaged all levels.  Final images reveal good position of the cages and the hardware at the proper level with normal alignment of spine.  Wound was irrigated 1 final time.  Hemostasis was achieved.  Wound was then closed in layers with Vicryl sutures.  Steri-Strips and sterile dressing were applied.  No apparent complications.  Patient tolerated the procedure well and he returns to the recovery room postop.

## 2023-01-21 NOTE — Brief Op Note (Signed)
 01/21/2023  2:10 PM  PATIENT:  Oscar Cox  48 y.o. male  PRE-OPERATIVE DIAGNOSIS:  Stenosis  POST-OPERATIVE DIAGNOSIS:  Stenosis  PROCEDURE:  Procedure(s): Anterior Cervical Discectomy and Fusion Cervical five-six, Cervical six-seven (N/A)  SURGEON:  Surgeons and Role:    * Louis Shove, MD - Primary    * Dawley, Lani BROCKS, DO - Assisting  PHYSICIAN ASSISTANT:   ASSISTANTS:    ANESTHESIA:   General  EBL:  75 mL   BLOOD ADMINISTERED:none  DRAINS: none   LOCAL MEDICATIONS USED:  NONE  SPECIMEN:  No Specimen  DISPOSITION OF SPECIMEN:  N/A  COUNTS:  YES  TOURNIQUET:  * No tourniquets in log *  DICTATION: .Dragon Dictation  PLAN OF CARE: Admit for overnight observation  PATIENT DISPOSITION:  PACU - hemodynamically stable.   Delay start of Pharmacological VTE agent (>24hrs) due to surgical blood loss or risk of bleeding: yes

## 2023-01-21 NOTE — Progress Notes (Signed)
 Orthopedic Tech Progress Note Patient Details:  Oscar Cox May 28, 1975 [id number]  Ortho Devices Type of Ortho Device: Soft collar Ortho Device/Splint Location: NECK Ortho Device/Splint Interventions: Ordered, Application, Adjustment   Post Interventions Patient Tolerated: Well Instructions Provided: Care of device  [name] Pac 01/21/2023, 4:36 PM

## 2023-01-21 NOTE — Anesthesia Procedure Notes (Signed)
 Procedure Name: Intubation Date/Time: 01/21/2023 12:08 PM  Performed by: Heber Elijah SAUNDERS, CRNAPre-anesthesia Checklist: Patient identified, Emergency Drugs available, Suction available and Patient being monitored Patient Re-evaluated:Patient Re-evaluated prior to induction Oxygen Delivery Method: Circle System Utilized Preoxygenation: Pre-oxygenation with 100% oxygen Induction Type: IV induction Ventilation: Mask ventilation without difficulty Laryngoscope Size: Glidescope and 3 Grade View: Grade I Tube type: Oral Tube size: 7.5 mm Number of attempts: 1 Airway Equipment and Method: Stylet and Oral airway Placement Confirmation: ETT inserted through vocal cords under direct vision, positive ETCO2 and breath sounds checked- equal and bilateral Tube secured with: Tape Dental Injury: Teeth and Oropharynx as per pre-operative assessment

## 2023-01-22 ENCOUNTER — Other Ambulatory Visit (HOSPITAL_COMMUNITY): Payer: Self-pay

## 2023-01-22 DIAGNOSIS — M4712 Other spondylosis with myelopathy, cervical region: Secondary | ICD-10-CM | POA: Diagnosis not present

## 2023-01-22 LAB — GLUCOSE, CAPILLARY: Glucose-Capillary: 136 mg/dL — ABNORMAL HIGH (ref 70–99)

## 2023-01-22 MED ORDER — CYCLOBENZAPRINE HCL 10 MG PO TABS
10.0000 mg | ORAL_TABLET | Freq: Three times a day (TID) | ORAL | 0 refills | Status: DC | PRN
  Filled 2023-01-22: qty 30, 10d supply, fill #0

## 2023-01-22 MED ORDER — HYDROCODONE-ACETAMINOPHEN 5-325 MG PO TABS
1.0000 | ORAL_TABLET | ORAL | 0 refills | Status: DC | PRN
  Filled 2023-01-22: qty 30, 3d supply, fill #0

## 2023-01-22 MED FILL — Thrombin For Soln 5000 Unit: CUTANEOUS | Qty: 2 | Status: AC

## 2023-01-22 NOTE — Discharge Instructions (Addendum)
   Wound Care Keep incision covered and dry until post op day 3. You may remove the Honeycomb dressing on post op day 3. Leave steri-strips on neck.  They will fall off by themselves. Do not put any creams, lotions, or ointments on incision. You are fine to shower. Let water run over incision and pat dry.  Activity Walk each and every day, increasing distance each day. No lifting greater than 8 lbs.  Avoid excessive neck motion. No driving, or riding a car unless coming back and forth to see the doctor  Diet Resume your normal diet.   Return to Work Will be discussed at your follow up appointment.  Call Your Doctor If Any of These Occur Redness, drainage, or swelling at the wound.  Temperature greater than 101 degrees. Severe pain not relieved by pain medication. Incision starts to come apart.  Follow Up Appt Call [phone] if you have one or any problem.

## 2023-01-22 NOTE — Discharge Summary (Signed)
 Physician Discharge Summary  Patient ID: Oscar Cox MRN: [id number] DOB/AGE: 48/06/1975 48 y.o.  Admit date: 01/21/2023 Discharge date: 01/22/2023  Admission Diagnoses:  Discharge Diagnoses:  Principal Problem:   Cervical spondylosis with myelopathy and radiculopathy   Discharged Condition: good  Hospital Course: Patient admitted to the hospital where underwent uncomplicated two-level anterior cervical decompression preoperative neck and upper extremity pain, numbness and weakness all resolved.  Patient standing ambulating and voiding without difficulty.  Ready for discharge home.    Consults:   Significant Diagnostic Studies:   Treatments:   Discharge Exam: Blood pressure 131/82, pulse 99, temperature 98 F (36.7 C), temperature source Oral, resp. rate 18, height 6' 2 (1.88 m), weight 88.5 kg, SpO2 99%. Awake and alert.  Oriented and appropriate.  Function intact.  Wound clean and dry.  Chest and abdomen benign.  Disposition: Discharge disposition: 01-Home or Self Care        Allergies as of 01/22/2023       Reactions   Tamiflu [oseltamivir Phosphate] Hives   Metformin  And Related Diarrhea   Morphine  Nausea Only   Potassium Chloride Nausea And Vomiting        Medication List     TAKE these medications    cetirizine  10 MG tablet Commonly known as: ZYRTEC  Take 1 tablet (10 mg total) by mouth daily.   cyclobenzaprine  10 MG tablet Commonly known as: FLEXERIL  Take 1 tablet (10 mg total) by mouth 3 (three) times daily as needed for muscle spasms.   fluticasone  50 MCG/ACT nasal spray Commonly known as: FLONASE  Use 2 spray(s) in each nostril once daily   Glyxambi  25-5 MG Tabs Generic drug: Empagliflozin -linaGLIPtin Take 1 tablet by mouth once daily   HYDROcodone -acetaminophen  5-325 MG tablet Commonly known as: NORCO/VICODIN Take 1-2 tablets by mouth every 4 (four) hours as needed for moderate pain (pain score 4-6) or severe pain (pain score 7-10).    lisinopril  20 MG tablet Commonly known as: ZESTRIL  Take 1 tablet by mouth once daily   montelukast  10 MG tablet Commonly known as: SINGULAIR  TAKE 1 TABLET BY MOUTH AT BEDTIME What changed: when to take this   omeprazole  20 MG capsule Commonly known as: PRILOSEC  Take 1 capsule by mouth once daily   pravastatin  40 MG tablet Commonly known as: PRAVACHOL  Take 1 tablet by mouth once daily         Signed: [employer] Darian Cansler 01/22/2023, 8:16 AM

## 2023-01-22 NOTE — Evaluation (Signed)
 Occupational Therapy Evaluation Patient Details Name: Oscar Cox MRN: [id number] DOB: 04/14/1975 Today's Date: 01/22/2023   History of Present Illness Patient is a 48 yo male presenting to the hospital for anterior cervical discectomy and fusion of C5-C6 and C6-C7 on 01/21/23. PMH includes: DM, HTN, Hep C   Clinical Impression   Patient presenting with minimal neck pain, but able to complete all ADLs at set up level and ambulatory at supervision level without need for assistive device. Patient adhering to cervical precautions throughout session. Patient requires no further OT services or follow up at discharge. OT signing off at this time. Please re-consult if further OT needs arise.       If plan is discharge home, recommend the following: Assist for transportation (initially)    Functional Status Assessment  Patient has had a recent decline in their functional status and demonstrates the ability to make significant improvements in function in a reasonable and predictable amount of time.  Equipment Recommendations  None recommended by OT    Recommendations for Other Services       Precautions / Restrictions Precautions Precautions: Cervical Precaution Booklet Issued: Yes (comment) Precaution Comments: Verbally reviewed in addition Required Braces or Orthoses: Cervical Brace Cervical Brace: Soft collar;At all times Restrictions Weight Bearing Restrictions Per Provider Order: No      Mobility Bed Mobility Overal bed mobility: Modified Independent             General bed mobility comments: already up in recliner, verbally reviewed log roll    Transfers Overall transfer level: Modified independent Equipment used: None                      Balance Overall balance assessment: Modified Independent                                         ADL either performed or assessed with clinical judgement   ADL Overall ADL's : Needs  assistance/impaired Eating/Feeding: Set up;Sitting   Grooming: Set up;Standing   Upper Body Bathing: Set up;Sitting;Standing   Lower Body Bathing: Set up;Sitting/lateral leans;Sit to/from stand   Upper Body Dressing : Set up   Lower Body Dressing: Set up;Sitting/lateral leans;Sit to/from stand Lower Body Dressing Details (indicate cue type and reason): completing figure 4 technique without effort Toilet Transfer: Supervision/safety   Toileting- Clothing Manipulation and Hygiene: Supervision/safety       Functional mobility during ADLs: Set up General ADL Comments: Patient presenting with minimal neck pain, but able to complete all ADLs at set up level and ambulatory at supervision level without need for assistive device. Patient adhering to cervical precautions throughout session. Patient requires no further OT services or follow up at discharge. OT signing off at this time. Please re-consult if further OT needs arise.     Vision Baseline Vision/History: 1 Wears glasses Ability to See in Adequate Light: 0 Adequate Patient Visual Report: No change from baseline Vision Assessment?: No apparent visual deficits     Perception Perception: Within Functional Limits       Praxis Praxis: WFL       Pertinent Vitals/Pain Pain Assessment Pain Assessment: Faces Faces Pain Scale: Hurts a little bit Pain Location: neck (incisional site) Pain Descriptors / Indicators: Discomfort, Grimacing, Guarding Pain Intervention(s): Limited activity within patient's tolerance, Monitored during session, Repositioned     Extremity/Trunk Assessment Upper Extremity Assessment Upper  Extremity Assessment: Overall WFL for tasks assessed   Lower Extremity Assessment Lower Extremity Assessment: Overall WFL for tasks assessed   Cervical / Trunk Assessment Cervical / Trunk Assessment: Neck Surgery   Communication Communication Communication: No apparent difficulties Cueing Techniques: Verbal cues    Cognition Arousal: Alert Behavior During Therapy: WFL for tasks assessed/performed Overall Cognitive Status: Within Functional Limits for tasks assessed                                       General Comments       Exercises     Shoulder Instructions      Home Living Family/patient expects to be discharged to:: Private residence Living Arrangements: Other relatives (Aunt and Education Officer, Community) Available Help at Discharge: Family;Available 24 hours/day Type of Home: House Home Access: Ramped entrance     Home Layout: One level     Bathroom Shower/Tub: Tub/shower unit;Walk-in shower   Bathroom Toilet: Standard     Home Equipment: Agricultural Consultant (2 wheels);BSC/3in1          Prior Functioning/Environment Prior Level of Function : Independent/Modified Independent;Driving             Mobility Comments: independent ADLs Comments: independent, does not work currently        OT Problem List: Pain;Decreased activity tolerance      OT Treatment/Interventions:      OT Goals(Current goals can be found in the care plan section) Acute Rehab OT Goals Patient Stated Goal: to go home and get better OT Goal Formulation: With patient Time For Goal Achievement: 02/05/23 Potential to Achieve Goals: Good  OT Frequency:      Co-evaluation              AM-PAC OT 6 Clicks Daily Activity     Outcome Measure Help from another person eating meals?: A Little Help from another person taking care of personal grooming?: A Little Help from another person toileting, which includes using toliet, bedpan, or urinal?: A Little Help from another person bathing (including washing, rinsing, drying)?: A Little Help from another person to put on and taking off regular upper body clothing?: A Little Help from another person to put on and taking off regular lower body clothing?: A Little 6 Click Score: 18   End of Session Nurse Communication: Other (comment) (No need for OT  follow up)  Activity Tolerance: Patient tolerated treatment well Patient left: in chair  OT Visit Diagnosis: Pain;Unsteadiness on feet (R26.81)                Time: 9177-9168 OT Time Calculation (min): 9 min Charges:  OT General Charges $OT Visit: 1 Visit OT Evaluation $OT Eval Low Complexity: 1 Low  Ronal Gift E. Yeng Frankie, OTR/L Acute Rehabilitation Services [phone]   Ronal Gift Salt 01/22/2023, 9:06 AM

## 2023-01-22 NOTE — Plan of Care (Signed)
 Problem: Education: Goal: Knowledge of General Education information will improve Description: Including pain rating scale, medication(s)/side effects and non-pharmacologic comfort measures 01/22/2023 1037 by Sherleen Flor, RN Outcome: Completed/Met 01/22/2023 1037 by Sherleen Flor, RN Outcome: Completed/Met   Problem: Health Behavior/Discharge Planning: Goal: Ability to manage health-related needs will improve 01/22/2023 1037 by Sherleen Flor, RN Outcome: Completed/Met 01/22/2023 1037 by Sherleen Flor, RN Outcome: Completed/Met   Problem: Clinical Measurements: Goal: Ability to maintain clinical measurements within normal limits will improve 01/22/2023 1037 by Sherleen Flor, RN Outcome: Completed/Met 01/22/2023 1037 by Sherleen Flor, RN Outcome: Completed/Met Goal: Will remain free from infection 01/22/2023 1037 by Sherleen Flor, RN Outcome: Completed/Met 01/22/2023 1037 by Sherleen Flor, RN Outcome: Completed/Met Goal: Diagnostic test results will improve 01/22/2023 1037 by Sherleen Flor, RN Outcome: Completed/Met 01/22/2023 1037 by Sherleen Flor, RN Outcome: Completed/Met Goal: Respiratory complications will improve 01/22/2023 1037 by Sherleen Flor, RN Outcome: Completed/Met 01/22/2023 1037 by Sherleen Flor, RN Outcome: Completed/Met Goal: Cardiovascular complication will be avoided 01/22/2023 1037 by Sherleen Flor, RN Outcome: Completed/Met 01/22/2023 1037 by Sherleen Flor, RN Outcome: Completed/Met   Problem: Activity: Goal: Risk for activity intolerance will decrease 01/22/2023 1037 by Sherleen Flor, RN Outcome: Completed/Met 01/22/2023 1037 by Sherleen Flor, RN Outcome: Completed/Met   Problem: Nutrition: Goal: Adequate nutrition will be maintained 01/22/2023 1037 by Sherleen Flor, RN Outcome: Completed/Met 01/22/2023 1037 by Sherleen Flor, RN Outcome: Completed/Met   Problem: Coping: Goal: Level of anxiety will decrease 01/22/2023  1037 by Sherleen Flor, RN Outcome: Completed/Met 01/22/2023 1037 by Sherleen Flor, RN Outcome: Completed/Met   Problem: Elimination: Goal: Will not experience complications related to bowel motility 01/22/2023 1037 by Sherleen Flor, RN Outcome: Completed/Met 01/22/2023 1037 by Sherleen Flor, RN Outcome: Completed/Met Goal: Will not experience complications related to urinary retention 01/22/2023 1037 by Sherleen Flor, RN Outcome: Completed/Met 01/22/2023 1037 by Sherleen Flor, RN Outcome: Completed/Met   Problem: Pain Management: Goal: General experience of comfort will improve 01/22/2023 1037 by Sherleen Flor, RN Outcome: Completed/Met 01/22/2023 1037 by Sherleen Flor, RN Outcome: Completed/Met   Problem: Safety: Goal: Ability to remain free from injury will improve 01/22/2023 1037 by Sherleen Flor, RN Outcome: Completed/Met 01/22/2023 1037 by Sherleen Flor, RN Outcome: Completed/Met   Problem: Skin Integrity: Goal: Risk for impaired skin integrity will decrease 01/22/2023 1037 by Sherleen Flor, RN Outcome: Completed/Met 01/22/2023 1037 by Sherleen Flor, RN Outcome: Completed/Met   Problem: Education: Goal: Ability to verbalize activity precautions or restrictions will improve 01/22/2023 1037 by Sherleen Flor, RN Outcome: Completed/Met 01/22/2023 1037 by Sherleen Flor, RN Outcome: Completed/Met Goal: Knowledge of the prescribed therapeutic regimen will improve 01/22/2023 1037 by Sherleen Flor, RN Outcome: Completed/Met 01/22/2023 1037 by Sherleen Flor, RN Outcome: Completed/Met Goal: Understanding of discharge needs will improve 01/22/2023 1037 by Sherleen Flor, RN Outcome: Completed/Met 01/22/2023 1037 by Sherleen Flor, RN Outcome: Completed/Met   Problem: Activity: Goal: Ability to avoid complications of mobility impairment will improve 01/22/2023 1037 by Sherleen Flor, RN Outcome: Completed/Met 01/22/2023 1037 by Sherleen Flor,  RN Outcome: Completed/Met Goal: Ability to tolerate increased activity will improve 01/22/2023 1037 by Sherleen Flor, RN Outcome: Completed/Met 01/22/2023 1037 by Sherleen Flor, RN Outcome: Completed/Met Goal: Will remain free from falls 01/22/2023 1037 by Sherleen Flor, RN Outcome: Completed/Met 01/22/2023 1037 by Sherleen Flor, RN Outcome: Completed/Met   Problem: Bowel/Gastric: Goal: Gastrointestinal status for postoperative course will improve 01/22/2023 1037 by Sherleen Flor, RN Outcome: Completed/Met 01/22/2023 1037 by Sherleen Flor, RN Outcome: Completed/Met   Problem: Clinical Measurements: Goal: Ability to maintain clinical measurements  within normal limits will improve 01/22/2023 1037 by Sherleen Flor, RN Outcome: Completed/Met 01/22/2023 1037 by Sherleen Flor, RN Outcome: Completed/Met Goal: Postoperative complications will be avoided or minimized 01/22/2023 1037 by Sherleen Flor, RN Outcome: Completed/Met 01/22/2023 1037 by Sherleen Flor, RN Outcome: Completed/Met Goal: Diagnostic test results will improve 01/22/2023 1037 by Sherleen Flor, RN Outcome: Completed/Met 01/22/2023 1037 by Sherleen Flor, RN Outcome: Completed/Met   Problem: Pain Management: Goal: Pain level will decrease 01/22/2023 1037 by Sherleen Flor, RN Outcome: Completed/Met 01/22/2023 1037 by Sherleen Flor, RN Outcome: Completed/Met   Problem: Skin Integrity: Goal: Will show signs of wound healing 01/22/2023 1037 by Sherleen Flor, RN Outcome: Completed/Met 01/22/2023 1037 by Sherleen Flor, RN Outcome: Completed/Met   Problem: Health Behavior/Discharge Planning: Goal: Identification of resources available to assist in meeting health care needs will improve 01/22/2023 1037 by Sherleen Flor, RN Outcome: Completed/Met 01/22/2023 1037 by Sherleen Flor, RN Outcome: Completed/Met   Problem: Bladder/Genitourinary: Goal: Urinary functional status for postoperative course  will improve 01/22/2023 1037 by Sherleen Flor, RN Outcome: Completed/Met 01/22/2023 1037 by Sherleen Flor, RN Outcome: Completed/Met

## 2023-01-22 NOTE — Progress Notes (Signed)
 Patient alert and oriented, mae's well, voiding adequate amount of urine, swallowing without difficulty, no c/o pain at time of discharge. Patient discharged home with family. Script and discharged instructions given to patient. Patient and family stated understanding of instructions given. Patient has an appointment with Dr. Jordan Likes in 2 weeks

## 2023-01-24 ENCOUNTER — Encounter (HOSPITAL_COMMUNITY): Payer: Self-pay | Admitting: Neurosurgery

## 2023-02-05 DIAGNOSIS — H5213 Myopia, bilateral: Secondary | ICD-10-CM | POA: Diagnosis not present

## 2023-03-12 ENCOUNTER — Other Ambulatory Visit: Payer: Self-pay | Admitting: Family Medicine

## 2023-03-12 DIAGNOSIS — J302 Other seasonal allergic rhinitis: Secondary | ICD-10-CM

## 2023-03-13 DIAGNOSIS — M5412 Radiculopathy, cervical region: Secondary | ICD-10-CM | POA: Diagnosis not present

## 2023-04-24 ENCOUNTER — Ambulatory Visit (INDEPENDENT_AMBULATORY_CARE_PROVIDER_SITE_OTHER): Payer: Medicaid Other | Admitting: Family Medicine

## 2023-04-24 ENCOUNTER — Encounter: Payer: Self-pay | Admitting: Family Medicine

## 2023-04-24 VITALS — BP 99/68 | HR 106 | Temp 98.1°F | Ht 74.0 in | Wt 194.0 lb

## 2023-04-24 DIAGNOSIS — R000 Tachycardia, unspecified: Secondary | ICD-10-CM | POA: Diagnosis not present

## 2023-04-24 DIAGNOSIS — E1169 Type 2 diabetes mellitus with other specified complication: Secondary | ICD-10-CM | POA: Diagnosis not present

## 2023-04-24 DIAGNOSIS — E785 Hyperlipidemia, unspecified: Secondary | ICD-10-CM

## 2023-04-24 DIAGNOSIS — K219 Gastro-esophageal reflux disease without esophagitis: Secondary | ICD-10-CM | POA: Diagnosis not present

## 2023-04-24 DIAGNOSIS — E1149 Type 2 diabetes mellitus with other diabetic neurological complication: Secondary | ICD-10-CM

## 2023-04-24 DIAGNOSIS — F1990 Other psychoactive substance use, unspecified, uncomplicated: Secondary | ICD-10-CM

## 2023-04-24 DIAGNOSIS — E1159 Type 2 diabetes mellitus with other circulatory complications: Secondary | ICD-10-CM

## 2023-04-24 DIAGNOSIS — E1140 Type 2 diabetes mellitus with diabetic neuropathy, unspecified: Secondary | ICD-10-CM | POA: Diagnosis not present

## 2023-04-24 DIAGNOSIS — I152 Hypertension secondary to endocrine disorders: Secondary | ICD-10-CM

## 2023-04-24 DIAGNOSIS — R718 Other abnormality of red blood cells: Secondary | ICD-10-CM | POA: Diagnosis not present

## 2023-04-24 LAB — BAYER DCA HB A1C WAIVED: HB A1C (BAYER DCA - WAIVED): 6.8 % — ABNORMAL HIGH (ref 4.8–5.6)

## 2023-04-24 MED ORDER — GLYXAMBI 25-5 MG PO TABS: 1.0000 | ORAL_TABLET | Freq: Every day | ORAL | 1 refills | Status: DC

## 2023-04-24 MED ORDER — LISINOPRIL 2.5 MG PO TABS: 2.5000 mg | ORAL_TABLET | Freq: Every day | ORAL | 1 refills | Status: DC

## 2023-04-24 NOTE — Addendum Note (Signed)
 Addended by: Neale Burly on: 04/24/2023 12:47 PM   Modules accepted: Level of Service

## 2023-04-24 NOTE — Progress Notes (Signed)
 Subjective:  Patient ID: Oscar Cox, male    DOB: 1975-08-23, 48 y.o.   MRN: [id number]  Patient Care Team: Arrie Senate, FNP as PCP - General (Family Medicine) Delora Fuel, OD (Optometry)   Chief Complaint:  Medical Management of Chronic Issues   HPI: Oscar Cox is a 48 y.o. male presenting on 04/24/2023 for Medical Management of Chronic Issues  HPI 1. Type 2 diabetes mellitus with diabetic neuropathy, without long-term current use of insulin (HCC)/2. Diabetic neuropathy with neurologic complication (HCC) Glucometer:has not been checking at home.   Taking medication(s): glyxambi, no side effects,.  Last eye exam: completed 10/29/2022 Last foot exam: due in May  Last A1c:  Lab Results  Component Value Date   HGBA1C 6.9 (H) 01/10/2023   Nephropathy screen indicated?: completed today  Last flu, zoster and/or pneumovax:  Immunization History  Administered Date(s) Administered   Tdap 06/02/2020   ROS: Denies dizziness, LOC, polyuria, polydipsia, unintended weight loss/gain, foot ulcerations, , shortness of breath or chest pain. States that he has intermittent numbness or tingling in extremities. States that this has improved now that his surgery has been done.   3. Hypertension associated with type 2 diabetes mellitus (HCC) Has BP monitor at home Yes States that his BP at home was very low so he started taking one half of a lisinopril. Reports that it was 90/50s. At that time he was lightheaded and dizzy. Reports symptoms of orthostatic hypotension.  ROS Denies anxiety, fatigue, peripheral edema, changes to vision, chest pain, headaches, palpitations, sweats, SOB, PND, orthopnea  CAD risks smoker, Diabetes Mellitus, hypertension, hypercholesterolemia/hyperlipidemia  4. Gastroesophageal reflux disease without esophagitis Current medications - omeprazole  Adverse side effects - none  Sore throat - no  Voice change - no Hemoptysis -  no Dysphagia or dyspepsia - yes  Water brash - no Red Flags (weight loss, hematochezia, melena, weight loss, early satiety, fevers, odynophagia, or persistent vomiting) - no States that he has reflux every now and then and may feel that he is going to vomit. States that it hits his "gag reflex". States that this happens 1-2 times per day.   5. Hyperlipidemia associated with type 2 diabetes mellitus (HCC) Continues to take pravastatin. No SE.  Denies changes to diet and exercise   6. Substance use disorder Continues to use marijuana and methamphetamines a few times per week.  Smoking cigarettes daily  Started at 48 years old.  Smokes 1 PPD    Relevant past medical, surgical, family, and social history reviewed and updated as indicated.  Allergies and medications reviewed and updated. Data reviewed: Chart in Epic.   Past Medical History:  Diagnosis Date   Acid reflux    Diabetes mellitus without complication (HCC)    Essential hypertension, benign 11/27/2016   Hepatitis    Hepatitis C virus infection cured after antiviral drug therapy 09/24/2018   High cholesterol 11/27/2016    Past Surgical History:  Procedure Laterality Date   ANTERIOR CERVICAL DECOMP/DISCECTOMY FUSION N/A 01/21/2023   Procedure: Anterior Cervical Discectomy and Fusion Cervical five-six, Cervical six-seven;  Surgeon: Julio Sicks, MD;  Location: [facility] OR;  Service: Neurosurgery;  Laterality: N/A;   COLONOSCOPY WITH PROPOFOL N/A 03/21/2020   Castaneda:normal ileum, diverticulosis in sigmoid colon, descending colon and ascending colon. Non bleeding internal hemorrhoids, no specimens collected.   FOOT SURGERY Left    FOOT SURGERY Right     Social History   Socioeconomic History   Marital status: Single  Spouse name: Not on file   Number of children: Not on file   Years of education: Not on file   Highest education level: Not on file  Occupational History   Occupation: Unemployed  Tobacco Use   Smoking  status: Every Day    Current packs/day: 2.00    Types: Cigarettes   Smokeless tobacco: Never  Vaping Use   Vaping status: Never Used  Substance and Sexual Activity   Alcohol use: Yes    Comment: rarely   Drug use: Yes    Frequency: 2.0 times per week    Types: Marijuana    Comment: 01/09/23 - last used   Sexual activity: Not on file  Other Topics Concern   Not on file  Social History Narrative   Not on file   Social Drivers of Health   Financial Resource Strain: Not on file  Food Insecurity: Not on file  Transportation Needs: Not on file  Physical Activity: Not on file  Stress: Not on file  Social Connections: Unknown (05/21/2021)   Received from [facility], [facility]   Social Network    Social Network: Not on file  Intimate Partner Violence: Unknown (04/19/2021)   Received from [facility], [facility]   HITS    Physically Hurt: Not on file    Insult or Talk Down To: Not on file    Threaten Physical Harm: Not on file    Scream or Curse: Not on file    Outpatient Encounter Medications as of 04/24/2023  Medication Sig   cetirizine (EQ ALLERGY RELIEF, CETIRIZINE,) 10 MG tablet Take 1 tablet by mouth once daily   cyclobenzaprine (FLEXERIL) 10 MG tablet Take 1 tablet (10 mg total) by mouth 3 (three) times daily as needed for muscle spasms.   fluticasone (FLONASE) 50 MCG/ACT nasal spray Use 2 spray(s) in each nostril once daily   GLYXAMBI 25-5 MG TABS Take 1 tablet by mouth once daily   HYDROcodone-acetaminophen (NORCO/VICODIN) 5-325 MG tablet Take 1-2 tablets by mouth every 4 (four) hours as needed for moderate pain (pain score 4-6) or severe pain (pain score 7-10).   lisinopril (ZESTRIL) 20 MG tablet Take 1 tablet by mouth once daily   montelukast (SINGULAIR) 10 MG tablet TAKE 1 TABLET BY MOUTH AT BEDTIME (Patient taking differently: Take 10 mg by mouth in the morning.)   omeprazole (PRILOSEC) 20 MG capsule Take 1 capsule by mouth once daily   pravastatin  (PRAVACHOL) 40 MG tablet Take 1 tablet by mouth once daily   [DISCONTINUED] cetirizine (ZYRTEC) 10 MG tablet Take 1 tablet (10 mg total) by mouth daily.   [DISCONTINUED] Empagliflozin-linaGLIPtin (GLYXAMBI) 25-5 MG TABS Take 1 tablet by mouth daily.   [DISCONTINUED] fluticasone (FLONASE) 50 MCG/ACT nasal spray Use 2 spray(s) in each nostril once daily   [DISCONTINUED] lisinopril (ZESTRIL) 20 MG tablet Take 1 tablet (20 mg total) by mouth daily.   [DISCONTINUED] montelukast (SINGULAIR) 10 MG tablet Take 1 tablet (10 mg total) by mouth at bedtime.   [DISCONTINUED] omeprazole (PRILOSEC) 20 MG capsule Take 1 capsule (20 mg total) by mouth daily.   [DISCONTINUED] pravastatin (PRAVACHOL) 40 MG tablet Take 1 tablet (40 mg total) by mouth daily.   No facility-administered encounter medications on file as of 04/24/2023.    Allergies  Allergen Reactions   Tamiflu [Oseltamivir Phosphate] Hives   Metformin And Related Diarrhea   Morphine Nausea Only   Potassium Chloride Nausea And Vomiting    Review of Systems As per  HPI  Objective:  BP 99/68   Pulse (!) 106   Temp 98.1 F (36.7 C)   Ht 6\' 2"  (1.88 m)   Wt 194 lb (88 kg)   SpO2 97%   BMI 24.91 kg/m    Wt Readings from Last 3 Encounters:  04/24/23 194 lb (88 kg)  01/21/23 195 lb (88.5 kg)  01/10/23 193 lb 4.8 oz (87.7 kg)   Physical Exam Constitutional:      General: He is awake. He is not in acute distress.    Appearance: Normal appearance. He is well-developed and well-groomed. He is not ill-appearing, toxic-appearing or diaphoretic.  Cardiovascular:     Rate and Rhythm: Regular rhythm. Tachycardia present.     Pulses: Normal pulses.          Radial pulses are 2+ on the right side and 2+ on the left side.       Posterior tibial pulses are 2+ on the right side and 2+ on the left side.     Heart sounds: Normal heart sounds. No murmur heard.    No gallop.  Pulmonary:     Effort: Pulmonary effort is normal. No respiratory distress.      Breath sounds: Normal breath sounds. No stridor. No wheezing, rhonchi or rales.  Musculoskeletal:     Cervical back: Full passive range of motion without pain and neck supple.     Right lower leg: No edema.     Left lower leg: No edema.  Feet:     Right foot:     Protective Sensation: 10 sites tested.  10 sites sensed.     Skin integrity: Callus and dry skin present.     Left foot:     Protective Sensation: 10 sites tested.  10 sites sensed.     Skin integrity: Callus and dry skin present.  Skin:    General: Skin is warm.     Capillary Refill: Capillary refill takes less than 2 seconds.  Neurological:     General: No focal deficit present.     Mental Status: He is alert, oriented to person, place, and time and easily aroused. Mental status is at baseline.     GCS: GCS eye subscore is 4. GCS verbal subscore is 5. GCS motor subscore is 6.     Motor: No weakness.  Psychiatric:        Attention and Perception: Attention and perception normal.        Mood and Affect: Mood and affect normal.        Speech: Speech normal.        Behavior: Behavior normal. Behavior is cooperative.        Thought Content: Thought content normal. Thought content does not include homicidal or suicidal ideation. Thought content does not include homicidal or suicidal plan.        Cognition and Memory: Cognition and memory normal.        Judgment: Judgment normal.    Results for orders placed or performed during the hospital encounter of 01/21/23  Glucose, capillary   Collection Time: 01/21/23 10:15 AM  Result Value Ref Range   Glucose-Capillary 174 (H) 70 - 99 mg/dL  Glucose, capillary   Collection Time: 01/21/23 11:25 AM  Result Value Ref Range   Glucose-Capillary 138 (H) 70 - 99 mg/dL  Glucose, capillary   Collection Time: 01/21/23  2:30 PM  Result Value Ref Range   Glucose-Capillary 126 (H) 70 - 99 mg/dL  Glucose, capillary  Collection Time: 01/21/23  5:13 PM  Result Value Ref Range    Glucose-Capillary 172 (H) 70 - 99 mg/dL   Comment 1 Notify RN    Comment 2 Document in Chart   Glucose, capillary   Collection Time: 01/21/23  8:51 PM  Result Value Ref Range   Glucose-Capillary 182 (H) 70 - 99 mg/dL   Comment 1 Notify RN    Comment 2 Document in Chart   Glucose, capillary   Collection Time: 01/22/23  6:47 AM  Result Value Ref Range   Glucose-Capillary 136 (H) 70 - 99 mg/dL   Comment 1 Notify RN    Comment 2 Document in Chart        04/24/2023    9:00 AM 10/24/2022    8:58 AM 05/24/2022    9:13 AM 11/23/2021    9:50 AM 05/23/2021    9:53 AM  Depression screen PHQ 2/9  Decreased Interest 1 1 1 2 1   Down, Depressed, Hopeless 0 1 1 1 1   PHQ - 2 Score 1 2 2 3 2   Altered sleeping 1 1 1 1 2   Tired, decreased energy 0 1 1 1 1   Change in appetite 0 0 0 0 1  Feeling bad or failure about yourself  0 0 0 0 0  Trouble concentrating 0 0 0 1 1  Moving slowly or fidgety/restless 0 0 0 1 0  Suicidal thoughts 0 0 0 0 0  PHQ-9 Score 2 4 4 7 7   Difficult doing work/chores Not difficult at all Somewhat difficult  Somewhat difficult Somewhat difficult       04/24/2023    9:01 AM 10/24/2022    8:59 AM 05/24/2022    9:13 AM 11/23/2021    9:51 AM  GAD 7 : Generalized Anxiety Score  Nervous, Anxious, on Edge 0 1 1 2   Control/stop worrying 0 0 0 1  Worry too much - different things 0 1 1 2   Trouble relaxing 0 1 0 1  Restless 0 1 0 1  Easily annoyed or irritable 0 1 1 2   Afraid - awful might happen 0 0 0 1  Total GAD 7 Score 0 5 3 10   Anxiety Difficulty Not difficult at all Somewhat difficult Somewhat difficult Somewhat difficult    Pertinent labs & imaging results that were available during my care of the patient were reviewed by me and considered in my medical decision making.  Assessment & Plan:  Pleasant was seen today for medical management of chronic issues.  Diagnoses and all orders for this visit:  1. Type 2 diabetes mellitus with diabetic neuropathy,  without long-term current use of insulin (HCC) (Primary) A1C controlled.  Labs as below. Will communicate results to patient once available. Will await results to determine next steps.  Continue current regimen.  Refills provided  - Bayer DCA Hb A1c Waived - CBC with Differential/Platelet - CMP14+EGFR - Vitamin B12 - VITAMIN D 25 Hydroxy (Vit-D Deficiency, Fractures) - Microalbumin / creatinine urine ratio - Empagliflozin-linaGLIPtin (GLYXAMBI) 25-5 MG TABS; Take 1 tablet by mouth daily.  Dispense: 90 tablet; Refill: 1  2. Diabetic neuropathy with neurologic complication (HCC) Improved. Well continue current regimen.   3. Hypertension associated with type 2 diabetes mellitus (HCC) Well controlled, having episodes of hypotension. Will reduce dose of medication as below. Patient to continue to monitor BP at home. Patient to send in measurements to mychart in 2 weeks.  - lisinopril (ZESTRIL) 2.5 MG tablet; Take 1 tablet (2.5  mg total) by mouth daily.  Dispense: 90 tablet; Refill: 1  4. Gastroesophageal reflux disease without esophagitis Not at goal. Patient is established with GI. Encouraged him to follow up as he is having daily symptoms of regurgitation.   5. Hyperlipidemia associated with type 2 diabetes mellitus (HCC) Labs as below. Will communicate results to patient once available. Will await results to determine next steps.  Continue current medication.  - Lipid panel  6. Substance use disorder Discussed at length with patient recommendation to cease methamphetamine use.Discussed risks with continued use. EKG as below with concerning abnormalities. Will refer to cardiology as below.  - EKG 12-Lead  7. Tachycardia Discussed at length with patient recommendation to cease methamphetamine use.Discussed risks with continued use. EKG as below with concerning abnormalities. Will refer to cardiology as below.  - EKG 12-Lead - Ambulatory referral to Cardiology  Continue all other  maintenance medications.  Follow up plan: Return in about 3 months (around 07/24/2023) for Chronic Condition Follow up.   Continue healthy lifestyle choices, including diet (rich in fruits, vegetables, and lean proteins, and low in salt and simple carbohydrates) and exercise (at least 30 minutes of moderate physical activity daily).  Written and verbal instructions provided   The above assessment and management plan was discussed with the patient. The patient verbalized understanding of and has agreed to the management plan. Patient is aware to call the clinic if they develop any new symptoms or if symptoms persist or worsen. Patient is aware when to return to the clinic for a follow-up visit. Patient educated on when it is appropriate to go to the emergency department.   Neale Burly, DNP-FNP [facility] [address] [place], [place] [address] [phone]

## 2023-04-25 LAB — CMP14+EGFR
ALT: 29 IU/L (ref 0–44)
AST: 18 IU/L (ref 0–40)
Albumin: 4.7 g/dL (ref 4.1–5.1)
Alkaline Phosphatase: 76 IU/L (ref 44–121)
BUN/Creatinine Ratio: 12 (ref 9–20)
BUN: 14 mg/dL (ref 6–24)
Bilirubin Total: 0.4 mg/dL (ref 0.0–1.2)
CO2: 21 mmol/L (ref 20–29)
Calcium: 10.3 mg/dL — ABNORMAL HIGH (ref 8.7–10.2)
Chloride: 95 mmol/L — ABNORMAL LOW (ref 96–106)
Creatinine, Ser: 1.16 mg/dL (ref 0.76–1.27)
Globulin, Total: 2.5 g/dL (ref 1.5–4.5)
Glucose: 154 mg/dL — ABNORMAL HIGH (ref 70–99)
Potassium: 4.5 mmol/L (ref 3.5–5.2)
Sodium: 136 mmol/L (ref 134–144)
Total Protein: 7.2 g/dL (ref 6.0–8.5)
eGFR: 78 mL/min/{1.73_m2} (ref >59)

## 2023-04-25 LAB — VITAMIN B12: Vitamin B-12: 357 pg/mL (ref 232–1245)

## 2023-04-25 LAB — CBC WITH DIFFERENTIAL/PLATELET
Basophils Absolute: 0.1 10*3/uL (ref 0.0–0.2)
Basos: 1 %
EOS (ABSOLUTE): 0.3 10*3/uL (ref 0.0–0.4)
Eos: 3 %
Hematocrit: 52 % — ABNORMAL HIGH (ref 37.5–51.0)
Hemoglobin: 17.4 g/dL (ref 13.0–17.7)
Immature Grans (Abs): 0 10*3/uL (ref 0.0–0.1)
Immature Granulocytes: 0 %
Lymphocytes Absolute: 3.4 10*3/uL — ABNORMAL HIGH (ref 0.7–3.1)
Lymphs: 32 %
MCH: 30.6 pg (ref 26.6–33.0)
MCHC: 33.5 g/dL (ref 31.5–35.7)
MCV: 91 fL (ref 79–97)
Monocytes Absolute: 0.8 10*3/uL (ref 0.1–0.9)
Monocytes: 7 %
Neutrophils Absolute: 6.2 10*3/uL (ref 1.4–7.0)
Neutrophils: 57 %
Platelets: 283 10*3/uL (ref 150–450)
RBC: 5.69 x10E6/uL (ref 4.14–5.80)
RDW: 12.5 % (ref 11.6–15.4)
WBC: 10.8 10*3/uL (ref 3.4–10.8)

## 2023-04-25 LAB — MICROALBUMIN / CREATININE URINE RATIO
Creatinine, Urine: 107.4 mg/dL
Microalb/Creat Ratio: 8 mg/g{creat} (ref 0–29)
Microalbumin, Urine: 9.1 ug/mL

## 2023-04-25 LAB — VITAMIN D 25 HYDROXY (VIT D DEFICIENCY, FRACTURES): Vit D, 25-Hydroxy: 18.4 ng/mL — ABNORMAL LOW (ref 30.0–100.0)

## 2023-04-25 LAB — LIPID PANEL
Chol/HDL Ratio: 3.4 ratio (ref 0.0–5.0)
Cholesterol, Total: 153 mg/dL (ref 100–199)
HDL: 45 mg/dL (ref >39)
LDL Chol Calc (NIH): 88 mg/dL (ref 0–99)
Triglycerides: 111 mg/dL (ref 0–149)
VLDL Cholesterol Cal: 20 mg/dL (ref 5–40)

## 2023-04-28 ENCOUNTER — Encounter: Payer: Self-pay | Admitting: Family Medicine

## 2023-04-28 MED ORDER — VITAMIN D (ERGOCALCIFEROL) 1.25 MG (50000 UNIT) PO CAPS: 50000.0000 [IU] | ORAL_CAPSULE | ORAL | 0 refills | Status: AC

## 2023-04-28 NOTE — Addendum Note (Signed)
 Addended by: Jacqualyn Mates on: 04/28/2023 07:08 PM   Modules accepted: Orders

## 2023-04-28 NOTE — Progress Notes (Signed)
 Stable, elevated hematocrit, will refer to hematology for further evaluation. BG elevated, consistent with A1C. Chloride with slight elevation, not concerning at this time. Calcium slightly elevated, will continue to monitor trend. Vitamin D level is low. I have sent in a weekly supplement to take for the next 12 weeks. After that, take a daily OTC vitamin D supplement with 1000-2000 IU. Cholesterol levels well controlled. Continue current regimen.  The 10-year ASCVD risk score (Arnett DK, et al., 2019) is: 6.6%   Values used to calculate the score:     Age: 48 years     Sex: Male     Is Non-Hispanic African American: No     Diabetic: Yes     Tobacco smoker: Yes     Systolic Blood Pressure: 99 mmHg     Is BP treated: Yes     HDL Cholesterol: 45 mg/dL     Total Cholesterol: 153 mg/dL

## 2023-05-15 DIAGNOSIS — G5602 Carpal tunnel syndrome, left upper limb: Secondary | ICD-10-CM | POA: Diagnosis not present

## 2023-05-15 DIAGNOSIS — M5412 Radiculopathy, cervical region: Secondary | ICD-10-CM | POA: Diagnosis not present

## 2023-05-16 ENCOUNTER — Encounter: Payer: Self-pay | Admitting: Neurology

## 2023-05-16 ENCOUNTER — Other Ambulatory Visit: Payer: Self-pay

## 2023-05-16 DIAGNOSIS — R202 Paresthesia of skin: Secondary | ICD-10-CM

## 2023-05-20 NOTE — Progress Notes (Signed)
 [facility] [address] [place] [address]   Clinic Day:  05/21/2023  Referring physician: Chrystine Crate*  Patient Care Team: Rosalynn Come [name], FNP as PCP - General (Family Medicine) Alexia Idler, OD (Optometry)   ASSESSMENT & PLAN:   Assessment:  1.  Elevated hematocrit: - Patient seen at the request of Jacqualyn Mates, FNP - CBC on 04/24/2023: Hb-17.4, HCT-52, normal WBC and PLT - Elevated hematocrit intermittently since November 2023 - Denies aquagenic pruritus/erythromelalgia/thrombosis. - Not on diuretics or testosterone supplements.  No history of sleep apnea.  2.  Social/family history: - He is currently not working.  He smokes 1 to 2 packs of cigarettes per day for the last 37 years. - No family history of polycythemia vera. - Brother died of anaplastic thyroid  carcinoma.  Father died of colon cancer.  Mother had breast cancer.  Plan:  1.  Elevated hematocrit: - We discussed primary causes of erythrocytosis including polycythemia vera and secondary causes including smoking related erythrocytosis. - Recommend repeating CBC today and send EPO level.  Will also check JAK2 V617F with reflex testing. - RTC 4 weeks for follow-up.   Orders Placed This Encounter  Procedures   Erythropoietin    Standing Status:   Future    Number of Occurrences:   1    Expected Date:   05/21/2023    Expiration Date:   05/20/2024   JAK2 V617F rfx CALR/MPL/E12-15    Standing Status:   Future    Number of Occurrences:   1    Expected Date:   05/21/2023    Expiration Date:   05/20/2024   CBC with Differential    Standing Status:   Future    Number of Occurrences:   1    Expected Date:   05/21/2023    Expiration Date:   05/20/2024      Hurman Maiden R Teague,acting as a scribe for Paulett Boros, MD.,have documented all relevant documentation on the behalf of Paulett Boros, MD,as directed by  Paulett Boros, MD while in the presence of  Paulett Boros, MD.   I, Paulett Boros MD, have reviewed the above documentation for accuracy and completeness, and I agree with the above.   Paulett Boros, MD   5/7/20252:07 PM  CHIEF COMPLAINT/PURPOSE OF CONSULT:   Diagnosis: Elevated hematocrit  Current Therapy: Under workup  HISTORY OF PRESENT ILLNESS:   Oscar Cox is a 48 y.o. male presenting to clinic today for evaluation of elevated hematocrit at the request of Milian, [name], FNP.  Patient has a medical history of DM type II, diabetic neuropathy, hypertension, GERD, hyperlipidemia, and cervical spondylosis with myelopathy and radiculopathy. Of note, Zerion had anterior cervical discectomy and fusion of C5-6, C6-7 on 01/21/23 under Dr. Adonis Alamin.   He was seen by his PCP on 04/24/23 and had lab work done the same day. CBC diff showed elevated HCT at 52.0 and elevated absolute lymphocytes at 3.4. Vitamin B-12 was normal at 357 and Vitamin D  was low at 18.4.   Today, he states that he is doing well overall. His appetite level is at 100%. His energy level is at 50%.  PAST MEDICAL HISTORY:   Past Medical History: Past Medical History:  Diagnosis Date   Acid reflux    Diabetes mellitus without complication (HCC)    Essential hypertension, benign 11/27/2016   Hepatitis    Hepatitis C virus infection cured after antiviral drug therapy 09/24/2018   High cholesterol 11/27/2016  Surgical History: Past Surgical History:  Procedure Laterality Date   ANTERIOR CERVICAL DECOMP/DISCECTOMY FUSION N/A 01/21/2023   Procedure: Anterior Cervical Discectomy and Fusion Cervical five-six, Cervical six-seven;  Surgeon: Agustina Aldrich, MD;  Location: [facility] OR;  Service: Neurosurgery;  Laterality: N/A;   COLONOSCOPY WITH PROPOFOL  N/A 03/21/2020   Castaneda:normal ileum, diverticulosis in sigmoid colon, descending colon and ascending colon. Non bleeding internal hemorrhoids, no specimens collected.   FOOT SURGERY Left     FOOT SURGERY Right     Social History: Social History   Socioeconomic History   Marital status: Single    Spouse name: Not on file   Number of children: Not on file   Years of education: Not on file   Highest education level: Not on file  Occupational History   Occupation: Unemployed  Tobacco Use   Smoking status: Every Day    Current packs/day: 2.00    Types: Cigarettes   Smokeless tobacco: Never  Vaping Use   Vaping status: Never Used  Substance and Sexual Activity   Alcohol use: Yes    Comment: rarely   Drug use: Yes    Frequency: 2.0 times per week    Types: Marijuana    Comment: 01/09/23 - last used   Sexual activity: Not Currently  Other Topics Concern   Not on file  Social History Narrative   Not on file   Social Drivers of Health   Financial Resource Strain: Not on file  Food Insecurity: No Food Insecurity (05/21/2023)   Hunger Vital Sign    Worried About Running Out of Food in the Last Year: Never true    Ran Out of Food in the Last Year: Never true  Transportation Needs: No Transportation Needs (05/21/2023)   PRAPARE - Administrator, Civil Service (Medical): No    Lack of Transportation (Non-Medical): No  Physical Activity: Not on file  Stress: Not on file  Social Connections: Unknown (05/21/2021)   [facility]   Social Network    Social Network: Not on file  Intimate Partner Violence: Not At Risk (05/21/2023)   Humiliation, Afraid, Rape, and Kick questionnaire    Fear of Current or Ex-Partner: No    Emotionally Abused: No    Physically Abused: No    Sexually Abused: No    Family History: Family History  Problem Relation Age of Onset   Hypertension Mother    Breast cancer Mother    Diabetes Father    Heart disease Father    Hypertension Father    Colon cancer Father    Diabetes Brother    Hypertension Brother    Thyroid  cancer Brother        Anaplastic    Current Medications:  Current Outpatient  Medications:    cetirizine  (EQ ALLERGY  RELIEF, CETIRIZINE ,) 10 MG tablet, Take 1 tablet by mouth once daily, Disp: 90 tablet, Rfl: 1   cyclobenzaprine  (FLEXERIL ) 10 MG tablet, Take 1 tablet (10 mg total) by mouth 3 (three) times daily as needed for muscle spasms., Disp: 30 tablet, Rfl: 0   Empagliflozin -linaGLIPtin (GLYXAMBI ) 25-5 MG TABS, Take 1 tablet by mouth daily., Disp: 90 tablet, Rfl: 1   fluticasone  (FLONASE ) 50 MCG/ACT nasal spray, Use 2 spray(s) in each nostril once daily, Disp: 48 g, Rfl: 1   HYDROcodone -acetaminophen  (NORCO/VICODIN) 5-325 MG tablet, Take 1-2 tablets by mouth every 4 (four) hours as needed for moderate pain (pain score 4-6) or severe pain (pain score 7-10)., Disp:  30 tablet, Rfl: 0   lisinopril  (ZESTRIL ) 2.5 MG tablet, Take 1 tablet (2.5 mg total) by mouth daily., Disp: 90 tablet, Rfl: 1   montelukast  (SINGULAIR ) 10 MG tablet, TAKE 1 TABLET BY MOUTH AT BEDTIME (Patient taking differently: Take 10 mg by mouth in the morning.), Disp: 90 tablet, Rfl: 1   omeprazole  (PRILOSEC ) 20 MG capsule, Take 1 capsule by mouth once daily, Disp: 90 capsule, Rfl: 1   pravastatin  (PRAVACHOL ) 40 MG tablet, Take 1 tablet by mouth once daily, Disp: 90 tablet, Rfl: 1   Vitamin D , Ergocalciferol , (DRISDOL ) 1.25 MG (50000 UNIT) CAPS capsule, Take 1 capsule (50,000 Units total) by mouth every 7 (seven) days for 12 doses., Disp: 12 capsule, Rfl: 0   Allergies: Allergies  Allergen Reactions   Tamiflu [Oseltamivir Phosphate] Hives   Metformin  And Related Diarrhea   Morphine  Nausea Only   Potassium Chloride Nausea And Vomiting    REVIEW OF SYSTEMS:   Review of Systems  Constitutional:  Negative for chills, fatigue and fever.  HENT:   Negative for lump/mass, mouth sores, nosebleeds, sore throat and trouble swallowing.   Eyes:  Negative for eye problems.  Respiratory:  Negative for cough and shortness of breath.   Cardiovascular:  Positive for palpitations. Negative for chest pain and leg  swelling.  Gastrointestinal:  Negative for abdominal pain, constipation, diarrhea, nausea and vomiting.  Genitourinary:  Negative for bladder incontinence, difficulty urinating, dysuria, frequency, hematuria and nocturia.   Musculoskeletal:  Negative for arthralgias, back pain, flank pain, myalgias and neck pain.  Skin:  Negative for itching and rash.  Neurological:  Positive for numbness. Negative for dizziness and headaches.  Hematological:  Does not bruise/bleed easily.  Psychiatric/Behavioral:  Negative for depression, sleep disturbance and suicidal ideas. The patient is not nervous/anxious.   All other systems reviewed and are negative.    VITALS:   Blood pressure 108/78, pulse 97, temperature (!) 97.2 F (36.2 C), temperature source Tympanic, resp. rate 18, height 6\' 2"  (1.88 m), weight 193 lb (87.5 kg), SpO2 95%.  Wt Readings from Last 3 Encounters:  05/21/23 193 lb (87.5 kg)  04/24/23 194 lb (88 kg)  01/21/23 195 lb (88.5 kg)    Body mass index is 24.78 kg/m.   PHYSICAL EXAM:   Physical Exam Vitals and nursing note reviewed. Exam conducted with a chaperone present.  Constitutional:      Appearance: Normal appearance.  Cardiovascular:     Rate and Rhythm: Normal rate and regular rhythm.     Pulses: Normal pulses.     Heart sounds: Normal heart sounds.  Pulmonary:     Effort: Pulmonary effort is normal.     Breath sounds: Normal breath sounds.  Abdominal:     Palpations: Abdomen is soft. There is no hepatomegaly, splenomegaly or mass.     Tenderness: There is no abdominal tenderness.  Musculoskeletal:     Right lower leg: No edema.     Left lower leg: No edema.  Lymphadenopathy:     Cervical: No cervical adenopathy.     Right cervical: No superficial, deep or posterior cervical adenopathy.    Left cervical: No superficial, deep or posterior cervical adenopathy.     Upper Body:     Right upper body: No supraclavicular or axillary adenopathy.     Left upper body:  No supraclavicular or axillary adenopathy.  Neurological:     General: No focal deficit present.     Mental Status: He is alert and oriented to  person, place, and time.  Psychiatric:        Mood and Affect: Mood normal.        Behavior: Behavior normal.     LABS:   CBC    Component Value Date/Time   WBC 10.8 04/24/2023 0904   WBC 12.6 (H) 01/10/2023 1230   RBC 5.69 04/24/2023 0904   RBC 5.79 01/10/2023 1230   HGB 17.4 04/24/2023 0904   HCT 52.0 (H) 04/24/2023 0904   PLT 283 04/24/2023 0904   MCV 91 04/24/2023 0904   MCH 30.6 04/24/2023 0904   MCH 30.4 01/10/2023 1230   MCHC 33.5 04/24/2023 0904   MCHC 33.6 01/10/2023 1230   RDW 12.5 04/24/2023 0904   LYMPHSABS 3.4 (H) 04/24/2023 0904   MONOABS 0.6 06/03/2012 1355   EOSABS 0.3 04/24/2023 0904   BASOSABS 0.1 04/24/2023 0904    CMP    Component Value Date/Time   NA 136 04/24/2023 0904   K 4.5 04/24/2023 0904   CL 95 (L) 04/24/2023 0904   CO2 21 04/24/2023 0904   GLUCOSE 154 (H) 04/24/2023 0904   GLUCOSE 142 (H) 01/10/2023 1230   BUN 14 04/24/2023 0904   CREATININE 1.16 04/24/2023 0904   CREATININE 0.83 03/21/2017 1000   CALCIUM 10.3 (H) 04/24/2023 0904   PROT 7.2 04/24/2023 0904   ALBUMIN  4.7 04/24/2023 0904   AST 18 04/24/2023 0904   ALT 29 04/24/2023 0904   ALKPHOS 76 04/24/2023 0904   BILITOT 0.4 04/24/2023 0904   GFRNONAA >60 01/10/2023 1230   GFRAA 92 01/26/2020 1311    No results found for: "CEA1", "CEA" / No results found for: "CEA1", "CEA" Lab Results  Component Value Date   PSA1 0.5 11/23/2021   No results found for: "ZOX096" No results found for: "CAN125"  No results found for: "TOTALPROTELP", "ALBUMINELP", "A1GS", "A2GS", "BETS", "BETA2SER", "GAMS", "MSPIKE", "SPEI" No results found for: "TIBC", "FERRITIN", "IRONPCTSAT" No results found for: "LDH"   STUDIES:   No results found.

## 2023-05-21 ENCOUNTER — Inpatient Hospital Stay: Attending: Hematology | Admitting: Hematology

## 2023-05-21 ENCOUNTER — Inpatient Hospital Stay

## 2023-05-21 ENCOUNTER — Encounter: Payer: Self-pay | Admitting: Hematology

## 2023-05-21 VITALS — BP 108/78 | HR 97 | Temp 97.2°F | Resp 18 | Ht 74.0 in | Wt 193.0 lb

## 2023-05-21 DIAGNOSIS — Z800 Family history of malignant neoplasm of digestive organs: Secondary | ICD-10-CM | POA: Diagnosis not present

## 2023-05-21 DIAGNOSIS — E1140 Type 2 diabetes mellitus with diabetic neuropathy, unspecified: Secondary | ICD-10-CM | POA: Insufficient documentation

## 2023-05-21 DIAGNOSIS — Z7984 Long term (current) use of oral hypoglycemic drugs: Secondary | ICD-10-CM | POA: Insufficient documentation

## 2023-05-21 DIAGNOSIS — R718 Other abnormality of red blood cells: Secondary | ICD-10-CM | POA: Insufficient documentation

## 2023-05-21 DIAGNOSIS — E7800 Pure hypercholesterolemia, unspecified: Secondary | ICD-10-CM | POA: Diagnosis not present

## 2023-05-21 DIAGNOSIS — Z79899 Other long term (current) drug therapy: Secondary | ICD-10-CM | POA: Diagnosis not present

## 2023-05-21 DIAGNOSIS — F17210 Nicotine dependence, cigarettes, uncomplicated: Secondary | ICD-10-CM | POA: Diagnosis not present

## 2023-05-21 DIAGNOSIS — I10 Essential (primary) hypertension: Secondary | ICD-10-CM | POA: Diagnosis not present

## 2023-05-21 DIAGNOSIS — Z803 Family history of malignant neoplasm of breast: Secondary | ICD-10-CM | POA: Diagnosis not present

## 2023-05-21 LAB — CBC WITH DIFFERENTIAL/PLATELET
Abs Immature Granulocytes: 0.03 10*3/uL (ref 0.00–0.07)
Basophils Absolute: 0.1 10*3/uL (ref 0.0–0.1)
Basophils Relative: 1 %
Eosinophils Absolute: 0.3 10*3/uL (ref 0.0–0.5)
Eosinophils Relative: 3 %
HCT: 54.4 % — ABNORMAL HIGH (ref 39.0–52.0)
Hemoglobin: 17.5 g/dL — ABNORMAL HIGH (ref 13.0–17.0)
Immature Granulocytes: 0 %
Lymphocytes Relative: 30 %
Lymphs Abs: 3.4 10*3/uL (ref 0.7–4.0)
MCH: 29.9 pg (ref 26.0–34.0)
MCHC: 32.2 g/dL (ref 30.0–36.0)
MCV: 93 fL (ref 80.0–100.0)
Monocytes Absolute: 0.8 10*3/uL (ref 0.1–1.0)
Monocytes Relative: 7 %
Neutro Abs: 6.6 10*3/uL (ref 1.7–7.7)
Neutrophils Relative %: 59 %
Platelets: 264 10*3/uL (ref 150–400)
RBC: 5.85 MIL/uL — ABNORMAL HIGH (ref 4.22–5.81)
RDW: 11.9 % (ref 11.5–15.5)
WBC: 11.1 10*3/uL — ABNORMAL HIGH (ref 4.0–10.5)
nRBC: 0 % (ref 0.0–0.2)

## 2023-05-21 NOTE — Patient Instructions (Addendum)
 You were seen and examined today by Dr. Ellin Saba. Dr. Ellin Saba is a hematologist, meaning that he specializes in blood abnormalities. Dr. Ellin Saba discussed your past medical history, family history of cancers/blood conditions and the events that led to you being here today.  You were referred to Dr. Ellin Saba due to elevated hemoglobin and hematocrit.  Dr. Ellin Saba has recommended additional labs today for further evaluation.  Follow-up as scheduled.

## 2023-05-22 LAB — ERYTHROPOIETIN: Erythropoietin: 8.1 m[IU]/mL (ref 2.6–18.5)

## 2023-05-23 ENCOUNTER — Encounter: Payer: Self-pay | Admitting: *Deleted

## 2023-05-29 LAB — CALR +MPL + E12-E15  (REFLEX)

## 2023-05-29 LAB — JAK2 V617F RFX CALR/MPL/E12-15

## 2023-06-18 ENCOUNTER — Ambulatory Visit: Admitting: Physician Assistant

## 2023-06-19 ENCOUNTER — Ambulatory Visit: Admitting: Dermatology

## 2023-06-19 ENCOUNTER — Encounter: Payer: Self-pay | Admitting: Dermatology

## 2023-06-19 VITALS — BP 119/78 | HR 98

## 2023-06-19 DIAGNOSIS — B079 Viral wart, unspecified: Secondary | ICD-10-CM

## 2023-06-19 DIAGNOSIS — D492 Neoplasm of unspecified behavior of bone, soft tissue, and skin: Secondary | ICD-10-CM

## 2023-06-19 DIAGNOSIS — B078 Other viral warts: Secondary | ICD-10-CM | POA: Diagnosis not present

## 2023-06-19 DIAGNOSIS — D485 Neoplasm of uncertain behavior of skin: Secondary | ICD-10-CM

## 2023-06-19 DIAGNOSIS — G5602 Carpal tunnel syndrome, left upper limb: Secondary | ICD-10-CM | POA: Insufficient documentation

## 2023-06-19 NOTE — Progress Notes (Signed)
   New Patient Visit   Subjective  Oscar Cox is a 48 y.o. male who presents for the following: Spot Check  Patient states he  has growth located at the face that he  would like to have examined. Patient reports the areas have been there for several years. He reports the areas are bothersome.He states that the areas has gotten bigger. He reports the area starting out as a small mole and gradually got bigger over the years. Patient reports he  has not previously been treated for these areas. Patient denies Hx of bx. Patient reports family history of skin cancer(s) (Dad had Melanoma).  The patient has spots, moles and lesions to be evaluated, some may be new or changing and the patient may have concern these could be cancer.  The following portions of the chart were reviewed this encounter and updated as appropriate: medications, allergies, medical history  Review of Systems:  No other skin or systemic complaints except as noted in HPI or Assessment and Plan.  Objective  Well appearing patient in no apparent distress; mood and affect are within normal limits.  A focused examination was performed of the following areas: Face  Relevant exam findings are noted in the Assessment and Plan.       Left Preauricular Area 1 cm Pedunculated crusted papule with finger like verruca  Assessment & Plan   Facial lesion, suspected wart - Assessment:  Patient presents with a 1 cm pedunculated papule with verrucous finger-like projections in the left preauricular area.    - Plan:    Perform shave biopsy of the left preauricular lesion    Apply hemostasis with electrocautery    Apply band-aid to biopsy site    Patient education:     - Keep area clean with soap and water     - Apply Aquaphor to biopsy site daily for about a week    Results communication:     - To be communicated via MyChart in approximately one week    NEOPLASM OF UNCERTAIN BEHAVIOR OF SKIN Left Preauricular  Area Epidermal / dermal shaving  Lesion diameter (cm):  1 Informed consent: discussed and consent obtained   Timeout: patient name, date of birth, surgical site, and procedure verified   Procedure prep:  Patient was prepped and draped in usual sterile fashion Prep type:  Isopropyl alcohol Anesthesia: the lesion was anesthetized in a standard fashion   Anesthetic:  1% lidocaine  w/ epinephrine 1-100,000 buffered w/ 8.4% NaHCO3 Instrument used: flexible razor blade   Hemostasis achieved with: pressure, aluminum chloride and electrodesiccation   Outcome: patient tolerated procedure well   Post-procedure details: sterile dressing applied and wound care instructions given   Dressing type: bandage and petrolatum   Specimen 1 - Surgical pathology Differential Diagnosis: R/O Wart vs SCC vs Other  Check Margins: No  Return if symptoms worsen or fail to improve.    Documentation: I have reviewed the above documentation for accuracy and completeness, and I agree with the above.  Louana Roup, DO

## 2023-06-19 NOTE — Patient Instructions (Addendum)

## 2023-06-20 ENCOUNTER — Inpatient Hospital Stay: Attending: Oncology | Admitting: Oncology

## 2023-06-20 ENCOUNTER — Encounter: Payer: Self-pay | Admitting: Oncology

## 2023-06-20 VITALS — BP 132/91 | HR 101 | Temp 97.6°F | Resp 16 | Wt 192.5 lb

## 2023-06-20 DIAGNOSIS — D72829 Elevated white blood cell count, unspecified: Secondary | ICD-10-CM | POA: Insufficient documentation

## 2023-06-20 DIAGNOSIS — Z79899 Other long term (current) drug therapy: Secondary | ICD-10-CM | POA: Diagnosis not present

## 2023-06-20 DIAGNOSIS — R718 Other abnormality of red blood cells: Secondary | ICD-10-CM | POA: Insufficient documentation

## 2023-06-20 DIAGNOSIS — F17210 Nicotine dependence, cigarettes, uncomplicated: Secondary | ICD-10-CM | POA: Diagnosis not present

## 2023-06-20 LAB — SURGICAL PATHOLOGY

## 2023-06-20 NOTE — Progress Notes (Signed)
 [facility] [address], [place] [address]   Clinic Day:  06/20/2023  Referring physician: Chrystine Crate*  Patient Care Team: Rosalynn Come [name], FNP as PCP - General (Family Medicine) Alexia Idler, OD (Optometry)   ASSESSMENT & PLAN:   Assessment:  1.  Elevated hematocrit: - Patient seen at the request of Jacqualyn Mates, FNP - CBC on 04/24/2023: Hb-17.4, HCT-52, normal WBC and PLT - Elevated hematocrit intermittently since November 2023 - Denies aquagenic pruritus/erythromelalgia/thrombosis. - Not on diuretics or testosterone supplements.  No history of sleep apnea.  2.  Social/family history: - He is currently not working.  He smokes 1 to 2 packs of cigarettes per day for the last 37 years. - No family history of polycythemia vera. - Brother died of anaplastic thyroid  carcinoma.  Father died of colon cancer.  Mother had breast cancer.  Plan:  1.  Elevated hematocrit: - Hematology workup negative for JAK2 mutation with reflex.  EPO levels normal.  -Hemoglobin has ranged from normal to as high as 17.6 since December. -Etiology felt to be secondary to smoking. -No other cell line deviations. -Most recent lab work from 05/21/2023 shows hemoglobin of 17.5 with a hematocrit of 54.4.  We discussed hyperviscosity symptoms and he is feeling great. 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 -She is not having any hyperviscosity symptoms and does not want a phlebotomy at this time. -Recommend follow-up with PCP or can speak with our clinic and have labs drawn in 6 months.  2.  Leukocytosis: -Slight elevation in white blood cell count 11.1 from lab work on 05/21/2023.  Differential is normal. -No new medications or steroid use.  Signs of an infection. -Etiology likely secondary to smoking as well. -Will recheck in 6 months.  Disposition: Clinic in 6 months  with labs a few days before and telephone visit.  Orders Placed This Encounter  Procedures   CBC with Differential/Platelet    Standing Status:   Future    Expected Date:   12/20/2023    Expiration Date:   06/19/2024    Aurther Blue, NP   6/6/202511:34 AM  CHIEF COMPLAINT/PURPOSE OF CONSULT:   Diagnosis: Elevated hematocrit  Current Therapy: Secondary erythrocytosis  HISTORY OF PRESENT ILLNESS:   Oscar Cox is a 48 y.o. male presenting to clinic today for evaluation of elevated hematocrit at the request of Milian, [name], FNP.  He was seen by Dr. Katragadda for initial workup on 05/21/2023.  He presents today with his dad.  He is overall doing well.  He denies any pain.  He has occasional numbness and burning in his fingers.  Appetite and energy levels are 100%.  Denies any hyperviscosity symptoms including blurry vision, frequent headaches or numbness and tingling.  Overall he feels well and stable.  He continues to smoke 1 to 2 packs of cigarettes per day.  Patient has a medical history of DM type II, diabetic neuropathy, hypertension, GERD, hyperlipidemia, and cervical spondylosis with myelopathy and radiculopathy. Of note, Tasean had anterior cervical discectomy and fusion of C5-6, C6-7 on 01/21/23 under Dr. Adonis Alamin.    PAST MEDICAL HISTORY:   Past Medical History: Past Medical History:  Diagnosis Date   Acid reflux    Diabetes mellitus without complication (HCC)    Essential hypertension, benign 11/27/2016   Hepatitis    Hepatitis C virus infection cured after antiviral drug therapy 09/24/2018  High cholesterol 11/27/2016    Surgical History: Past Surgical History:  Procedure Laterality Date   ANTERIOR CERVICAL DECOMP/DISCECTOMY FUSION N/A 01/21/2023   Procedure: Anterior Cervical Discectomy and Fusion Cervical five-six, Cervical six-seven;  Surgeon: Agustina Aldrich, MD;  Location: [facility] OR;  Service: Neurosurgery;  Laterality: N/A;   COLONOSCOPY WITH PROPOFOL  N/A  03/21/2020   Castaneda:normal ileum, diverticulosis in sigmoid colon, descending colon and ascending colon. Non bleeding internal hemorrhoids, no specimens collected.   FOOT SURGERY Left    FOOT SURGERY Right     Social History: Social History   Socioeconomic History   Marital status: Single    Spouse name: Not on file   Number of children: Not on file   Years of education: Not on file   Highest education level: Not on file  Occupational History   Occupation: Unemployed  Tobacco Use   Smoking status: Every Day    Current packs/day: 2.00    Types: Cigarettes    Passive exposure: Never   Smokeless tobacco: Never  Vaping Use   Vaping status: Never Used  Substance and Sexual Activity   Alcohol use: Yes    Comment: rarely   Drug use: Yes    Frequency: 2.0 times per week    Types: Marijuana    Comment: 01/09/23 - last used   Sexual activity: Not Currently  Other Topics Concern   Not on file  Social History Narrative   Not on file   Social Drivers of Health   Financial Resource Strain: Not on file  Food Insecurity: No Food Insecurity (05/21/2023)   Hunger Vital Sign    Worried About Running Out of Food in the Last Year: Never true    Ran Out of Food in the Last Year: Never true  Transportation Needs: No Transportation Needs (05/21/2023)   PRAPARE - Administrator, Civil Service (Medical): No    Lack of Transportation (Non-Medical): No  Physical Activity: Not on file  Stress: Not on file  Social Connections: Unknown (05/21/2021)   [facility] from [facility], [facility]   Social Network    Social Network: Not on file  Intimate Partner Violence: Not At Risk (05/21/2023)   Humiliation, Afraid, Rape, and Kick questionnaire    Fear of Current or Ex-Partner: No    Emotionally Abused: No    Physically Abused: No    Sexually Abused: No    Family History: Family History  Problem Relation Age of Onset   Hypertension Mother    Breast cancer Mother    Diabetes  Father    Heart disease Father    Hypertension Father    Colon cancer Father    Diabetes Brother    Hypertension Brother    Thyroid  cancer Brother        Anaplastic    Current Medications:  Current Outpatient Medications:    cetirizine  (EQ ALLERGY  RELIEF, CETIRIZINE ,) 10 MG tablet, Take 1 tablet by mouth once daily, Disp: 90 tablet, Rfl: 1   Empagliflozin -linaGLIPtin (GLYXAMBI ) 25-5 MG TABS, Take 1 tablet by mouth daily., Disp: 90 tablet, Rfl: 1   fluticasone  (FLONASE ) 50 MCG/ACT nasal spray, Use 2 spray(s) in each nostril once daily, Disp: 48 g, Rfl: 1   lisinopril  (ZESTRIL ) 2.5 MG tablet, Take 1 tablet (2.5 mg total) by mouth daily., Disp: 90 tablet, Rfl: 1   montelukast  (SINGULAIR ) 10 MG tablet, TAKE 1 TABLET BY MOUTH AT BEDTIME (Patient taking differently: Take 10 mg by mouth in the morning.), Disp: 90  tablet, Rfl: 1   omeprazole  (PRILOSEC ) 20 MG capsule, Take 1 capsule by mouth once daily, Disp: 90 capsule, Rfl: 1   pravastatin  (PRAVACHOL ) 40 MG tablet, Take 1 tablet by mouth once daily, Disp: 90 tablet, Rfl: 1   Vitamin D , Ergocalciferol , (DRISDOL ) 1.25 MG (50000 UNIT) CAPS capsule, Take 1 capsule (50,000 Units total) by mouth every 7 (seven) days for 12 doses., Disp: 12 capsule, Rfl: 0   Allergies: Allergies  Allergen Reactions   Tamiflu [Oseltamivir Phosphate] Hives   Metformin  And Related Diarrhea   Morphine  Nausea Only   Potassium Chloride Nausea And Vomiting    REVIEW OF SYSTEMS:   Review of Systems  Constitutional:  Negative for fatigue.  Neurological:  Positive for numbness.     VITALS:   Blood pressure (!) 132/91, pulse (!) 101, temperature 97.6 F (36.4 C), temperature source Oral, resp. rate 16, weight 192 lb 7.4 oz (87.3 kg), SpO2 97%.  Wt Readings from Last 3 Encounters:  06/20/23 192 lb 7.4 oz (87.3 kg)  05/21/23 193 lb (87.5 kg)  04/24/23 194 lb (88 kg)    Body mass index is 24.71 kg/m.   PHYSICAL EXAM:   Physical Exam Constitutional:       Appearance: Normal appearance.  Cardiovascular:     Rate and Rhythm: Normal rate and regular rhythm.  Pulmonary:     Effort: Pulmonary effort is normal.     Breath sounds: Normal breath sounds.  Abdominal:     General: Bowel sounds are normal.     Palpations: Abdomen is soft.  Musculoskeletal:        General: No swelling. Normal range of motion.  Neurological:     Mental Status: He is alert and oriented to person, place, and time. Mental status is at baseline.     LABS:   CBC    Component Value Date/Time   WBC 11.1 (H) 05/21/2023 1404   RBC 5.85 (H) 05/21/2023 1404   HGB 17.5 (H) 05/21/2023 1404   HGB 17.4 04/24/2023 0904   HCT 54.4 (H) 05/21/2023 1404   HCT 52.0 (H) 04/24/2023 0904   PLT 264 05/21/2023 1404   PLT 283 04/24/2023 0904   MCV 93.0 05/21/2023 1404   MCV 91 04/24/2023 0904   MCH 29.9 05/21/2023 1404   MCHC 32.2 05/21/2023 1404   RDW 11.9 05/21/2023 1404   RDW 12.5 04/24/2023 0904   LYMPHSABS 3.4 05/21/2023 1404   LYMPHSABS 3.4 (H) 04/24/2023 0904   MONOABS 0.8 05/21/2023 1404   EOSABS 0.3 05/21/2023 1404   EOSABS 0.3 04/24/2023 0904   BASOSABS 0.1 05/21/2023 1404   BASOSABS 0.1 04/24/2023 0904    CMP    Component Value Date/Time   NA 136 04/24/2023 0904   K 4.5 04/24/2023 0904   CL 95 (L) 04/24/2023 0904   CO2 21 04/24/2023 0904   GLUCOSE 154 (H) 04/24/2023 0904   GLUCOSE 142 (H) 01/10/2023 1230   BUN 14 04/24/2023 0904   CREATININE 1.16 04/24/2023 0904   CREATININE 0.83 03/21/2017 1000   CALCIUM 10.3 (H) 04/24/2023 0904   PROT 7.2 04/24/2023 0904   ALBUMIN  4.7 04/24/2023 0904   AST 18 04/24/2023 0904   ALT 29 04/24/2023 0904   ALKPHOS 76 04/24/2023 0904   BILITOT 0.4 04/24/2023 0904   GFRNONAA >60 01/10/2023 1230   GFRAA 92 01/26/2020 1311    No results found for: "CEA1", "CEA" / No results found for: "CEA1", "CEA" Lab Results  Component Value Date   PSA1  0.5 11/23/2021   No results found for: "CAN199" No results found for:  "CAN125"  No results found for: "TOTALPROTELP", "ALBUMINELP", "A1GS", "A2GS", "BETS", "BETA2SER", "GAMS", "MSPIKE", "SPEI" No results found for: "TIBC", "FERRITIN", "IRONPCTSAT" No results found for: "LDH"   STUDIES:   No results found.

## 2023-06-24 ENCOUNTER — Ambulatory Visit: Payer: Self-pay | Admitting: Dermatology

## 2023-06-27 ENCOUNTER — Ambulatory Visit: Admitting: Neurology

## 2023-06-27 DIAGNOSIS — G5603 Carpal tunnel syndrome, bilateral upper limbs: Secondary | ICD-10-CM

## 2023-06-27 DIAGNOSIS — R202 Paresthesia of skin: Secondary | ICD-10-CM | POA: Diagnosis not present

## 2023-06-27 DIAGNOSIS — G5622 Lesion of ulnar nerve, left upper limb: Secondary | ICD-10-CM

## 2023-06-27 NOTE — Procedures (Signed)
 [facility]  [address], [address]  [place], [place] [address] Tel: [phone] Fax: [phone] Test Date:  06/27/2023  Patient: Oscar Cox DOB: [birth year] Physician: Reyna Cava, DO  Sex: Male Height: 6' 2 Ref Phys: Agustina Aldrich, MD  ID#: [id number]   Technician:    History: This is a 48 year old man s/p C5-6 and C6-7 ACDF referred for evaluation of bilateral hand paresthesias, worse on the left.  NCV & EMG Findings: Extensive electrodiagnostic testing of the right upper extremity and additional studies of the left shows:  Bilateral median and left ulnar sensory responses show prolonged latency (R4.3, L5.3, L3.3 ms) and reduced amplitude (R15.4, L7.1, L11.5 V).  Right ulnar sensory response is within normal limits.  Bilateral median motor responses show prolonged latency (R4.2, L7.0 ms).  Left median motor response shows reduced amplitude, however there is presence of a Martin-Gruber anastomoses, a normal anatomic variant.  Left ulnar motor response shows prolonged latency (3.2 ms) and decreased conduction velocity (A Elbow-B Elbow, 42 m/s).  Right ulnar motor response is within normal limits. There is no evidence of active or chronic motor axonal loss changes affecting any of the tested muscles.  Motor unit configuration and recruitment pattern is within normal limits.  Impression: Bilateral median neuropathy at or distal to the wrist, consistent with a clinical diagnosis of carpal tunnel syndrome.  Overall, these findings are severe on the left, and moderate on the right. Left ulnar neuropathy with slowing across the elbow, predominantly demyelinating, moderate.   ___________________________ Reyna Cava, DO    Nerve Conduction Studies   Stim Site NR Peak (ms) Norm Peak (ms) O-P Amp (V) Norm O-P Amp  Left Median Anti Sensory (2nd Digit)  32 C  Wrist    *5.3 <3.4 *7.1 >20  Right Median Anti Sensory (2nd Digit)  32 C  Wrist    *4.3 <3.4 *15.4 >20   Left Ulnar Anti Sensory (5th Digit)  32 C  Wrist    *3.3 <3.1 *11.5 >12  Right Ulnar Anti Sensory (5th Digit)  32 C  Wrist    3.1 <3.1 13.5 >12     Stim Site NR Onset (ms) Norm Onset (ms) O-P Amp (mV) Norm O-P Amp Site1 Site2 Delta-0 (ms) Dist (cm) Vel (m/s) Norm Vel (m/s)  Left Median Motor (Abd Poll Brev)  32 C  Wrist    *7.0 <3.9 *3.0 >6 Elbow Wrist 5.2 31.0 60 >50  Elbow    12.2  4.2  Ulnar-wrist crossover Elbow 7.4 0.0    Ulnar-wrist crossover    4.8  2.5         Right Median Motor (Abd Poll Brev)  32 C  Wrist    *4.2 <3.9 6.2 >6 Elbow Wrist 6.3 32.0 51 >50  Elbow    10.5  5.8         Left Ulnar Motor (Abd Dig Minimi)  32 C  Wrist    *3.2 <3.1 10.9 >7 B Elbow Wrist 4.7 24.0 51 >50  B Elbow    7.9  10.1  A Elbow B Elbow 2.4 10.0 *42 >50  A Elbow    10.3  9.9         Right Ulnar Motor (Abd Dig Minimi)  32 C  Wrist    3.0 <3.1 10.9 >7 B Elbow Wrist 5.6 24.0 50 >50  B Elbow    8.6  10.5  A Elbow B Elbow 2.0 10.0 50 >50  A Elbow  10.6  10.2          Electromyography   Side Muscle Ins.Act Fibs Fasc Recrt Amp Dur Poly Activation Comment  Left 1stDorInt Nml Nml Nml Nml Nml Nml Nml Nml N/A  Left Abd Poll Brev Nml Nml Nml Nml Nml Nml Nml Nml N/A  Left PronatorTeres Nml Nml Nml Nml Nml Nml Nml Nml N/A  Left Biceps Nml Nml Nml Nml Nml Nml Nml Nml N/A  Left Triceps Nml Nml Nml Nml Nml Nml Nml Nml N/A  Left Deltoid Nml Nml Nml Nml Nml Nml Nml Nml N/A  Right 1stDorInt Nml Nml Nml Nml Nml Nml Nml Nml N/A  Right Abd Poll Brev Nml Nml Nml Nml Nml Nml Nml Nml N/A  Right PronatorTeres Nml Nml Nml Nml Nml Nml Nml Nml N/A  Right Biceps Nml Nml Nml Nml Nml Nml Nml Nml N/A  Right Triceps Nml Nml Nml Nml Nml Nml Nml Nml N/A  Right Deltoid Nml Nml Nml Nml Nml Nml Nml Nml N/A  Left Abd Dig Min Nml Nml Nml Nml Nml Nml Nml Nml N/A  Left FlexCarpiUln Nml Nml Nml Nml Nml Nml Nml Nml N/A      Waveforms:

## 2023-06-30 ENCOUNTER — Other Ambulatory Visit: Payer: Self-pay | Admitting: Family Medicine

## 2023-06-30 DIAGNOSIS — E1140 Type 2 diabetes mellitus with diabetic neuropathy, unspecified: Secondary | ICD-10-CM

## 2023-06-30 DIAGNOSIS — J302 Other seasonal allergic rhinitis: Secondary | ICD-10-CM

## 2023-06-30 DIAGNOSIS — E1169 Type 2 diabetes mellitus with other specified complication: Secondary | ICD-10-CM

## 2023-07-10 ENCOUNTER — Ambulatory Visit: Admitting: Family Medicine

## 2023-07-14 ENCOUNTER — Ambulatory Visit: Admitting: Nurse Practitioner

## 2023-07-14 ENCOUNTER — Encounter: Payer: Self-pay | Admitting: Nurse Practitioner

## 2023-07-14 VITALS — BP 128/87 | HR 121 | Temp 98.2°F | Ht 74.0 in | Wt 192.0 lb

## 2023-07-14 DIAGNOSIS — E1169 Type 2 diabetes mellitus with other specified complication: Secondary | ICD-10-CM

## 2023-07-14 DIAGNOSIS — E1159 Type 2 diabetes mellitus with other circulatory complications: Secondary | ICD-10-CM

## 2023-07-14 DIAGNOSIS — K219 Gastro-esophageal reflux disease without esophagitis: Secondary | ICD-10-CM | POA: Diagnosis not present

## 2023-07-14 DIAGNOSIS — L97509 Non-pressure chronic ulcer of other part of unspecified foot with unspecified severity: Secondary | ICD-10-CM

## 2023-07-14 DIAGNOSIS — E785 Hyperlipidemia, unspecified: Secondary | ICD-10-CM

## 2023-07-14 DIAGNOSIS — E559 Vitamin D deficiency, unspecified: Secondary | ICD-10-CM

## 2023-07-14 DIAGNOSIS — E1140 Type 2 diabetes mellitus with diabetic neuropathy, unspecified: Secondary | ICD-10-CM

## 2023-07-14 DIAGNOSIS — I152 Hypertension secondary to endocrine disorders: Secondary | ICD-10-CM | POA: Diagnosis not present

## 2023-07-14 MED ORDER — LISINOPRIL 2.5 MG PO TABS: 2.5000 mg | ORAL_TABLET | Freq: Every day | ORAL | 1 refills | Status: DC

## 2023-07-14 MED ORDER — PRAVASTATIN SODIUM 40 MG PO TABS: 40.0000 mg | ORAL_TABLET | Freq: Every day | ORAL | 0 refills | Status: DC

## 2023-07-14 MED ORDER — OMEPRAZOLE 20 MG PO CPDR: 20.0000 mg | DELAYED_RELEASE_CAPSULE | Freq: Every day | ORAL | 1 refills | Status: DC

## 2023-07-14 NOTE — Patient Instructions (Signed)

## 2023-07-14 NOTE — Progress Notes (Signed)
 Subjective:    Patient ID: Oscar Cox, male    DOB: 06/10/1975, 48 y.o.   MRN: [id number]   Chief Complaint: medical management of chronic issues     HPI:  Oscar Cox is a 48 y.o. who identifies as a male who was assigned male at birth.   Social history: Lives with: mom Work history: current;y not employeed   Comes in today for follow up of the following chronic medical issues:  1. Hypertension associated with type 2 diabetes mellitus (HCC) No c/o chest pain, sob or headache. Doe snot check blood pressure at home. BP Readings from Last 3 Encounters:  06/20/23 (!) 132/91  06/19/23 119/78  05/21/23 108/78     2. Hyperlipidemia associated with type 2 diabetes mellitus (HCC) Does not watch diet and does very little exercise. This SmartLink has not been configured with any valid records.   Lab Results  Component Value Date   CHOL 153 04/24/2023   HDL 45 04/24/2023   LDLCALC 88 04/24/2023   TRIG 111 04/24/2023   CHOLHDL 3.4 04/24/2023   The 10-year ASCVD risk score (Arnett DK, et al., 2019) is: 10.3%   3. Type 2 diabetes mellitus with diabetic neuropathy, without long-term current use of insulin  (HCC) Very seldom checks blood sugars at home. Not very compliant with his diet Lab Results  Component Value Date   HGBA1C 6.8 (H) 04/24/2023     4. Vitamin D  deficiency Is on a weekly vitamin d  supplement and is getting ready to start on daiy dose of 2000Iu soon.   New complaints: None today  Allergies  Allergen Reactions   Tamiflu [Oseltamivir Phosphate] Hives   Metformin  And Related Diarrhea   Morphine  Nausea Only   Potassium Chloride Nausea And Vomiting   Outpatient Encounter Medications as of 07/14/2023  Medication Sig   cetirizine  (EQ ALLERGY  RELIEF, CETIRIZINE ,) 10 MG tablet Take 1 tablet by mouth once daily   Empagliflozin -linaGLIPtin (GLYXAMBI ) 25-5 MG TABS Take 1 tablet by mouth daily.   fluticasone  (FLONASE ) 50 MCG/ACT nasal spray Use  2 spray(s) in each nostril once daily   lisinopril  (ZESTRIL ) 2.5 MG tablet Take 1 tablet (2.5 mg total) by mouth daily.   montelukast  (SINGULAIR ) 10 MG tablet TAKE 1 TABLET BY MOUTH AT BEDTIME   omeprazole  (PRILOSEC ) 20 MG capsule Take 1 capsule by mouth once daily   pravastatin  (PRAVACHOL ) 40 MG tablet Take 1 tablet by mouth once daily   Vitamin D , Ergocalciferol , (DRISDOL ) 1.25 MG (50000 UNIT) CAPS capsule Take 1 capsule (50,000 Units total) by mouth every 7 (seven) days for 12 doses.   No facility-administered encounter medications on file as of 07/14/2023.    Past Surgical History:  Procedure Laterality Date   ANTERIOR CERVICAL DECOMP/DISCECTOMY FUSION N/A 01/21/2023   Procedure: Anterior Cervical Discectomy and Fusion Cervical five-six, Cervical six-seven;  Surgeon: Louis Shove, MD;  Location: [facility] OR;  Service: Neurosurgery;  Laterality: N/A;   COLONOSCOPY WITH PROPOFOL  N/A 03/21/2020   Castaneda:normal ileum, diverticulosis in sigmoid colon, descending colon and ascending colon. Non bleeding internal hemorrhoids, no specimens collected.   FOOT SURGERY Left    FOOT SURGERY Right     Family History  Problem Relation Age of Onset   Hypertension Mother    Breast cancer Mother    Diabetes Father    Heart disease Father    Hypertension Father    Colon cancer Father    Diabetes Brother    Hypertension Brother    Thyroid  cancer  Brother        Anaplastic      Controlled substance contract: n/a     Review of Systems  Constitutional:  Negative for diaphoresis.  Eyes:  Negative for pain.  Respiratory:  Negative for shortness of breath.   Cardiovascular:  Negative for chest pain, palpitations and leg swelling.  Gastrointestinal:  Negative for abdominal pain.  Endocrine: Negative for polydipsia.  Skin:  Negative for rash.  Neurological:  Negative for dizziness, weakness and headaches.  Hematological:  Does not bruise/bleed easily.  All other systems reviewed and are  negative.      Objective:   Physical Exam Vitals and nursing note reviewed.  Constitutional:      Appearance: Normal appearance. He is well-developed.  HENT:     Head: Normocephalic.     Nose: Nose normal.     Mouth/Throat:     Mouth: Mucous membranes are moist.     Pharynx: Oropharynx is clear.   Eyes:     Pupils: Pupils are equal, round, and reactive to light.   Neck:     Thyroid : No thyroid  mass or thyromegaly.     Vascular: No carotid bruit or JVD.     Trachea: Phonation normal.   Cardiovascular:     Rate and Rhythm: Normal rate and regular rhythm.  Pulmonary:     Effort: Pulmonary effort is normal. No respiratory distress.     Breath sounds: Normal breath sounds.  Abdominal:     General: Bowel sounds are normal.     Palpations: Abdomen is soft.     Tenderness: There is no abdominal tenderness.   Musculoskeletal:        General: Normal range of motion.     Cervical back: Normal range of motion and neck supple.  Lymphadenopathy:     Cervical: No cervical adenopathy.   Skin:    General: Skin is warm and dry.     Comments: Black  hard crusty area on medial side of right great toe.- no drainage- no erythema   Neurological:     Mental Status: He is alert and oriented to person, place, and time.   Psychiatric:        Behavior: Behavior normal.        Thought Content: Thought content normal.        Judgment: Judgment normal.     BP 128/87   Pulse (!) 121   Temp 98.2 F (36.8 C) (Temporal)   Ht 6' 2 (1.88 m)   Wt 192 lb (87.1 kg)   SpO2 95%   BMI 24.65 kg/m        Assessment & Plan:  Oscar Cox comes in today with chief complaint of Medical Management of Chronic Issues   Diagnosis and orders addressed:  1. Hypertension associated with type 2 diabetes mellitus (HCC) (Primary) Low sodium diet - lisinopril  (ZESTRIL ) 2.5 MG tablet; Take 1 tablet (2.5 mg total) by mouth daily.  Dispense: 90 tablet; Refill: 1  2. Hyperlipidemia associated  with type 2 diabetes mellitus (HCC) Low fat diet - pravastatin  (PRAVACHOL ) 40 MG tablet; Take 1 tablet (40 mg total) by mouth daily.  Dispense: 90 tablet; Refill: 0  3. Type 2 diabetes mellitus with diabetic neuropathy, without long-term current use of insulin  (HCC) Watch carbs in diet - pravastatin  (PRAVACHOL ) 40 MG tablet; Take 1 tablet (40 mg total) by mouth daily.  Dispense: 90 tablet; Refill: 0  4. Vitamin D  deficiency Daily vitamin d  supplement  5. Gastroesophageal  reflux disease without esophagitis Avoid spicy foods Do not eat 2 hours prior to bedtime  - omeprazole  (PRILOSEC ) 20 MG capsule; Take 1 capsule (20 mg total) by mouth daily.  Dispense: 90 capsule; Refill: 1  6. Toe sore Needs to see podiatry ASAP- patient to make own appointment Labs pending Health Maintenance reviewed Diet and exercise encouraged  Follow up plan: 3 months   Mary-Margaret Gladis, FNP

## 2023-07-15 ENCOUNTER — Telehealth: Payer: Self-pay | Admitting: Podiatry

## 2023-07-15 NOTE — Telephone Encounter (Signed)
 I attempted to reach you twice at both phone numbers we have on file in response to your message regarding scheduling an appointment. I also left a voicemail on your cell phone.  To schedule an appointment, please feel free to call us  at [phone]. Any of our agents will be happy to assist you.  Thank you, and we look forward to hearing from you soon.  Best regards, Levon

## 2023-07-19 ENCOUNTER — Other Ambulatory Visit: Payer: Self-pay | Admitting: Family

## 2023-07-19 DIAGNOSIS — J302 Other seasonal allergic rhinitis: Secondary | ICD-10-CM

## 2023-07-23 DIAGNOSIS — M5416 Radiculopathy, lumbar region: Secondary | ICD-10-CM | POA: Diagnosis not present

## 2023-07-23 DIAGNOSIS — G5602 Carpal tunnel syndrome, left upper limb: Secondary | ICD-10-CM | POA: Diagnosis not present

## 2023-07-24 ENCOUNTER — Ambulatory Visit: Admitting: Family Medicine

## 2023-08-18 ENCOUNTER — Other Ambulatory Visit: Payer: Self-pay | Admitting: Neurosurgery

## 2023-08-18 DIAGNOSIS — M5416 Radiculopathy, lumbar region: Secondary | ICD-10-CM

## 2023-08-24 ENCOUNTER — Ambulatory Visit
Admission: RE | Admit: 2023-08-24 | Discharge: 2023-08-24 | Disposition: A | Discharge status: Home / Self Care | Source: Ambulatory Visit | Attending: Neurosurgery | Admitting: Neurosurgery

## 2023-08-24 DIAGNOSIS — M5416 Radiculopathy, lumbar region: Secondary | ICD-10-CM

## 2023-09-11 ENCOUNTER — Encounter: Payer: Self-pay | Admitting: Internal Medicine

## 2023-09-11 ENCOUNTER — Ambulatory Visit: Attending: Internal Medicine | Admitting: Internal Medicine

## 2023-09-11 VITALS — BP 142/80 | HR 100 | Ht 74.0 in | Wt 203.2 lb

## 2023-09-11 DIAGNOSIS — R000 Tachycardia, unspecified: Secondary | ICD-10-CM | POA: Diagnosis not present

## 2023-09-11 MED ORDER — METOPROLOL TARTRATE 25 MG PO TABS: ORAL_TABLET | ORAL | 1 refills | Status: DC

## 2023-09-11 NOTE — Patient Instructions (Signed)
 Medication Instructions:  Take Metoprolol  25 mg daily for heart rate over 100 , or palpitations  Labwork: None today  Testing/Procedures: None today  Follow-Up: As needed  Any Other Special Instructions Will Be Listed Below (If Applicable).  If you need a refill on your cardiac medications before your next appointment, please call your pharmacy.

## 2023-09-11 NOTE — Progress Notes (Signed)
 Cardiology Office Note  Date: 09/11/2023   ID: Emric, Kowalewski 11-25-1975, MRN [id number]  PCP:  Gladis Mustard, FNP  Cardiologist:  Diannah SHAUNNA Maywood, MD Electrophysiologist:  None   History of Present Illness: Oscar RODARTE is a 48 y.o. male  Referred to cardiology clinic for evaluation of sinus tachycardia.  Patient is asymptomatic.  Denied having any palpitations.  Denies having any angina or DOE.  No dizziness, syncope, lightheadedness, leg swelling.  Active at baseline.  EKG today showed NSR, HR 100 bpm.  He had a neck surgery recently and after which his dizziness completely resolved.  Past Medical History:  Diagnosis Date   Acid reflux    Diabetes mellitus without complication (HCC)    Essential hypertension, benign 11/27/2016   Hepatitis    Hepatitis C virus infection cured after antiviral drug therapy 09/24/2018   High cholesterol 11/27/2016    Past Surgical History:  Procedure Laterality Date   ANTERIOR CERVICAL DECOMP/DISCECTOMY FUSION N/A 01/21/2023   Procedure: Anterior Cervical Discectomy and Fusion Cervical five-six, Cervical six-seven;  Surgeon: Louis Shove, MD;  Location: [facility] OR;  Service: Neurosurgery;  Laterality: N/A;   COLONOSCOPY WITH PROPOFOL  N/A 03/21/2020   Castaneda:normal ileum, diverticulosis in sigmoid colon, descending colon and ascending colon. Non bleeding internal hemorrhoids, no specimens collected.   FOOT SURGERY Left    FOOT SURGERY Right     Current Outpatient Medications  Medication Sig Dispense Refill   cetirizine  (EQ ALLERGY  RELIEF, CETIRIZINE ,) 10 MG tablet Take 1 tablet by mouth once daily 90 tablet 1   Empagliflozin -linaGLIPtin (GLYXAMBI ) 25-5 MG TABS Take 1 tablet by mouth daily. 90 tablet 1   fluticasone  (FLONASE ) 50 MCG/ACT nasal spray Use 2 spray(s) in each nostril once daily 48 g 0   lisinopril  (ZESTRIL ) 2.5 MG tablet Take 1 tablet (2.5 mg total) by mouth daily. 90 tablet 1   metoprolol  tartrate  (LOPRESSOR ) 25 MG tablet Take 25 mg by mouth daily for heart rate over 100 or palpitations 90 tablet 1   montelukast  (SINGULAIR ) 10 MG tablet TAKE 1 TABLET BY MOUTH AT BEDTIME 90 tablet 1   omeprazole  (PRILOSEC ) 20 MG capsule Take 1 capsule (20 mg total) by mouth daily. 90 capsule 1   pravastatin  (PRAVACHOL ) 40 MG tablet Take 1 tablet (40 mg total) by mouth daily. 90 tablet 0   No current facility-administered medications for this visit.   Allergies:  Tamiflu [oseltamivir phosphate], Metformin  and related, Morphine , and Potassium chloride   Social History: The patient  reports that he has been smoking cigarettes. He has never been exposed to tobacco smoke. He has never used smokeless tobacco. He reports that he does not currently use alcohol. He reports current drug use. Frequency: 2.00 times per week. Drug: Marijuana.   Family History: The patient's family history includes Breast cancer in his mother; Colon cancer in his father; Diabetes in his brother and father; Heart disease in his father; Hypertension in his brother, father, and mother; Thyroid  cancer in his brother.   ROS:  Please see the history of present illness. Otherwise, complete review of systems is positive for none.  All other systems are reviewed and negative.   Physical Exam: VS:  BP (!) 142/80 (BP Location: Left Arm, Cuff Size: Normal)   Pulse 100   Ht 6' 2 (1.88 m)   Wt 203 lb 3.2 oz (92.2 kg)   SpO2 96%   BMI 26.09 kg/m , BMI Body mass index is 26.09 kg/m.  Wt Readings  from Last 3 Encounters:  09/11/23 203 lb 3.2 oz (92.2 kg)  07/14/23 192 lb (87.1 kg)  06/20/23 192 lb 7.4 oz (87.3 kg)    General: Patient appears comfortable at rest. HEENT: Conjunctiva and lids normal, oropharynx clear with moist mucosa. Neck: Supple, no elevated JVP or carotid bruits, no thyromegaly. Lungs: Clear to auscultation, nonlabored breathing at rest. Cardiac: Regular rate and rhythm, no S3 or significant systolic murmur, no pericardial  rub. Abdomen: Soft, nontender, no hepatomegaly, bowel sounds present, no guarding or rebound. Extremities: No pitting edema, distal pulses 2+. Skin: Warm and dry. Musculoskeletal: No kyphosis. Neuropsychiatric: Alert and oriented x3, affect grossly appropriate.  Recent Labwork: 04/24/2023: ALT 29; AST 18; BUN 14; Creatinine, Ser 1.16; Potassium 4.5; Sodium 136 05/21/2023: Hemoglobin 17.5; Platelets 264     Component Value Date/Time   CHOL 153 04/24/2023 0904   TRIG 111 04/24/2023 0904   HDL 45 04/24/2023 0904   CHOLHDL 3.4 04/24/2023 0904   LDLCALC 88 04/24/2023 0904     Assessment and Plan:  Sinus tachycardia - Asymptomatic.  No palpitations. - He does have elevated heart rates, 90-100s consistently and sometimes more than that.  In the absence of symptoms, would not recommend any treatment. - However will prescribe metoprolol  tartrate 25 mg daily as needed for HR more than 100 bpm and palpitations. - No indication for event monitor at this time. - EKG today showed normal sinus rhythm, HR 100 bpm.  HTN, controlled - He has an element of whitecoat HTN. - Home BP log reviewed, blood pressures are around 120 mmHg SBP. - Continue lisinopril  2.5 mg once daily.   30 minutes spent in reviewing prior records, more than 3 labs, test/imaging, discussion of sinus tachycardia, HTN with the patient today and documentation.  Medication Adjustments/Labs and Tests Ordered: Current medicines are reviewed at length with the patient today.  Concerns regarding medicines are outlined above.    Disposition:  Follow up as needed  Signed, Kona Yusuf Arleta Maywood, MD, 09/11/2023 10:10 AM    [facility] [address] [place], [place] [address] d

## 2023-09-25 DIAGNOSIS — M5416 Radiculopathy, lumbar region: Secondary | ICD-10-CM | POA: Diagnosis not present

## 2023-09-25 DIAGNOSIS — Z6826 Body mass index (BMI) 26.0-26.9, adult: Secondary | ICD-10-CM | POA: Diagnosis not present

## 2023-10-29 ENCOUNTER — Encounter (INDEPENDENT_AMBULATORY_CARE_PROVIDER_SITE_OTHER): Payer: Self-pay | Admitting: Gastroenterology

## 2023-10-30 ENCOUNTER — Ambulatory Visit: Payer: Self-pay | Admitting: Nurse Practitioner

## 2023-10-30 ENCOUNTER — Encounter: Payer: Self-pay | Admitting: Nurse Practitioner

## 2023-10-30 ENCOUNTER — Ambulatory Visit: Admitting: Nurse Practitioner

## 2023-10-30 VITALS — BP 117/81 | HR 78 | Temp 97.0°F | Ht 74.0 in | Wt 199.0 lb

## 2023-10-30 DIAGNOSIS — R000 Tachycardia, unspecified: Secondary | ICD-10-CM | POA: Diagnosis not present

## 2023-10-30 DIAGNOSIS — R739 Hyperglycemia, unspecified: Secondary | ICD-10-CM

## 2023-10-30 DIAGNOSIS — E1169 Type 2 diabetes mellitus with other specified complication: Secondary | ICD-10-CM | POA: Diagnosis not present

## 2023-10-30 DIAGNOSIS — L84 Corns and callosities: Secondary | ICD-10-CM

## 2023-10-30 DIAGNOSIS — K219 Gastro-esophageal reflux disease without esophagitis: Secondary | ICD-10-CM

## 2023-10-30 DIAGNOSIS — E785 Hyperlipidemia, unspecified: Secondary | ICD-10-CM | POA: Diagnosis not present

## 2023-10-30 DIAGNOSIS — Z7984 Long term (current) use of oral hypoglycemic drugs: Secondary | ICD-10-CM | POA: Diagnosis not present

## 2023-10-30 DIAGNOSIS — E1140 Type 2 diabetes mellitus with diabetic neuropathy, unspecified: Secondary | ICD-10-CM | POA: Diagnosis not present

## 2023-10-30 DIAGNOSIS — E1159 Type 2 diabetes mellitus with other circulatory complications: Secondary | ICD-10-CM | POA: Diagnosis not present

## 2023-10-30 DIAGNOSIS — I152 Hypertension secondary to endocrine disorders: Secondary | ICD-10-CM | POA: Diagnosis not present

## 2023-10-30 LAB — LIPID PANEL

## 2023-10-30 LAB — BAYER DCA HB A1C WAIVED: HB A1C (BAYER DCA - WAIVED): 8 % — ABNORMAL HIGH (ref 4.8–5.6)

## 2023-10-30 MED ORDER — PRAVASTATIN SODIUM 40 MG PO TABS: 40.0000 mg | ORAL_TABLET | Freq: Every day | ORAL | 0 refills | Status: DC

## 2023-10-30 MED ORDER — GLYXAMBI 25-5 MG PO TABS: 1.0000 | ORAL_TABLET | Freq: Every day | ORAL | 1 refills | Status: DC

## 2023-10-30 MED ORDER — METOPROLOL TARTRATE 25 MG PO TABS: ORAL_TABLET | ORAL | 1 refills | Status: AC

## 2023-10-30 MED ORDER — GLYXAMBI 25-5 MG PO TABS: 1.0000 | ORAL_TABLET | Freq: Every day | ORAL | 1 refills | Status: AC

## 2023-10-30 MED ORDER — LISINOPRIL 2.5 MG PO TABS: 2.5000 mg | ORAL_TABLET | Freq: Every day | ORAL | 1 refills | Status: AC

## 2023-10-30 MED ORDER — OMEPRAZOLE 20 MG PO CPDR: 20.0000 mg | DELAYED_RELEASE_CAPSULE | Freq: Every day | ORAL | 1 refills | Status: AC

## 2023-10-30 MED ORDER — GLYXAMBI 10-5 MG PO TABS: 1.0000 | ORAL_TABLET | Freq: Every day | ORAL | 1 refills | Status: DC

## 2023-10-30 NOTE — Progress Notes (Signed)
 Subjective:    Patient ID: Oscar Cox, male    DOB: 1975-03-27, 48 y.o.   MRN: [id number]   Chief Complaint: medical management of chronic issues     HPI:  Oscar Cox is a 48 y.o. who identifies as a male who was assigned male at birth.   Social history: Lives with: mom Work history: current;y not employeed   Comes in today for follow up of the following chronic medical issues:  1. Hypertension associated with type 2 diabetes mellitus (HCC) No c/o chest pain, sob or headache. Doe snot check blood pressure at home. BP Readings from Last 3 Encounters:  10/30/23 117/81  09/11/23 (!) 142/80  07/14/23 128/87     2. Hyperlipidemia associated with type 2 diabetes mellitus (HCC) Does not watch diet and does very little exercise. This SmartLink has not been configured with any valid records.   Lab Results  Component Value Date   CHOL 153 04/24/2023   HDL 45 04/24/2023   LDLCALC 88 04/24/2023   TRIG 111 04/24/2023   CHOLHDL 3.4 04/24/2023   The 10-year ASCVD risk score (Arnett DK, et al., 2019) is: 9.5%   3. Type 2 diabetes mellitus with diabetic neuropathy, without long-term current use of insulin  (HCC) Very seldom checks blood sugars at home. Not very compliant with his diet Lab Results  Component Value Date   HGBA1C 6.8 (H) 04/24/2023     4. Vitamin D  deficiency Is on a weekly vitamin d  supplement and is getting ready to start on daiy dose of 2000Iu soon.   New complaints: None today  Allergies  Allergen Reactions   Tamiflu [Oseltamivir Phosphate] Hives   Metformin  And Related Diarrhea   Morphine  Nausea Only   Potassium Chloride Nausea And Vomiting   Outpatient Encounter Medications as of 10/30/2023  Medication Sig   cetirizine  (EQ ALLERGY  RELIEF, CETIRIZINE ,) 10 MG tablet Take 1 tablet by mouth once daily   Empagliflozin -linaGLIPtin (GLYXAMBI ) 25-5 MG TABS Take 1 tablet by mouth daily.   fluticasone  (FLONASE ) 50 MCG/ACT nasal spray Use 2  spray(s) in each nostril once daily   lisinopril  (ZESTRIL ) 2.5 MG tablet Take 1 tablet (2.5 mg total) by mouth daily.   metoprolol  tartrate (LOPRESSOR ) 25 MG tablet Take 25 mg by mouth daily for heart rate over 100 or palpitations   montelukast  (SINGULAIR ) 10 MG tablet TAKE 1 TABLET BY MOUTH AT BEDTIME   omeprazole  (PRILOSEC ) 20 MG capsule Take 1 capsule (20 mg total) by mouth daily.   pravastatin  (PRAVACHOL ) 40 MG tablet Take 1 tablet (40 mg total) by mouth daily.   No facility-administered encounter medications on file as of 10/30/2023.    Past Surgical History:  Procedure Laterality Date   ANTERIOR CERVICAL DECOMP/DISCECTOMY FUSION N/A 01/21/2023   Procedure: Anterior Cervical Discectomy and Fusion Cervical five-six, Cervical six-seven;  Surgeon: Louis Shove, MD;  Location: [facility] OR;  Service: Neurosurgery;  Laterality: N/A;   COLONOSCOPY WITH PROPOFOL  N/A 03/21/2020   Castaneda:normal ileum, diverticulosis in sigmoid colon, descending colon and ascending colon. Non bleeding internal hemorrhoids, no specimens collected.   FOOT SURGERY Left    FOOT SURGERY Right     Family History  Problem Relation Age of Onset   Hypertension Mother    Breast cancer Mother    Diabetes Father    Heart disease Father    Hypertension Father    Colon cancer Father    Diabetes Brother    Hypertension Brother    Thyroid  cancer Brother  Anaplastic      Controlled substance contract: n/a     Review of Systems  Constitutional:  Negative for diaphoresis.  Eyes:  Negative for pain.  Respiratory:  Negative for shortness of breath.   Cardiovascular:  Negative for chest pain, palpitations and leg swelling.  Gastrointestinal:  Negative for abdominal pain.  Endocrine: Negative for polydipsia.  Skin:  Negative for rash.  Neurological:  Negative for dizziness, weakness and headaches.  Hematological:  Does not bruise/bleed easily.  All other systems reviewed and are negative.      Objective:    Physical Exam Vitals and nursing note reviewed.  Constitutional:      Appearance: Normal appearance. He is well-developed.  HENT:     Head: Normocephalic.     Nose: Nose normal.     Mouth/Throat:     Mouth: Mucous membranes are moist.     Pharynx: Oropharynx is clear.  Eyes:     Pupils: Pupils are equal, round, and reactive to light.  Neck:     Thyroid : No thyroid  mass or thyromegaly.     Vascular: No carotid bruit or JVD.     Trachea: Phonation normal.  Cardiovascular:     Rate and Rhythm: Normal rate and regular rhythm.  Pulmonary:     Effort: Pulmonary effort is normal. No respiratory distress.     Breath sounds: Normal breath sounds.  Abdominal:     General: Bowel sounds are normal.     Palpations: Abdomen is soft.     Tenderness: There is no abdominal tenderness.  Musculoskeletal:        General: Normal range of motion.     Cervical back: Normal range of motion and neck supple.  Lymphadenopathy:     Cervical: No cervical adenopathy.  Skin:    General: Skin is warm and dry.     Comments: Black  hard crusty area on medial side of right great toe.- no drainage- no erythema  Neurological:     Mental Status: He is alert and oriented to person, place, and time.  Psychiatric:        Behavior: Behavior normal.        Thought Content: Thought content normal.        Judgment: Judgment normal.     BP 117/81   Pulse 78   Temp (!) 97 F (36.1 C) (Temporal)   Ht 6' 2 (1.88 m)   Wt 199 lb (90.3 kg)   SpO2 95%   BMI 25.55 kg/m   hgba1c 8.0%     Oscar Cox comes in today with chief complaint of Medical Management of Chronic Issues   Diagnosis and orders addressed:  1. Hypertension associated with type 2 diabetes mellitus (HCC) (Primary) Low sodium diet - CBC with Differential/Platelet - CMP14+EGFR - lisinopril  (ZESTRIL ) 2.5 MG tablet; Take 1 tablet (2.5 mg total) by mouth daily.  Dispense: 90 tablet; Refill: 1  2. Hyperlipidemia associated with type 2  diabetes mellitus (HCC) Low fat diet - Lipid panel - pravastatin  (PRAVACHOL ) 40 MG tablet; Take 1 tablet (40 mg total) by mouth daily.  Dispense: 90 tablet; Refill: 0  3. Type 2 diabetes mellitus with diabetic neuropathy, without long-term current use of insulin  (HCC) Stricter carb counting Continue current glyxambi  - Bayer DCA Hb A1c Waived - Empagliflozin -linaGLIPtin (GLYXAMBI ) 25-5 MG TABS; Take 1 tablet by mouth daily.  Dispense: 90 tablet; Refill: 1  4. Callus of toe Do  not pick at area yourself - Ambulatory referral to  Podiatry  5. Gastroesophageal reflux disease without esophagitis Avoid spicy foods Do not eat 2 hours prior to bedtime  - omeprazole  (PRILOSEC ) 20 MG capsule; Take 1 capsule (20 mg total) by mouth daily.  Dispense: 90 capsule; Refill: 1  6. Sinus tachycardia Avoid caffeine - metoprolol  tartrate (LOPRESSOR ) 25 MG tablet; Take 25 mg by mouth daily for heart rate over 100 or palpitations  Dispense: 90 tablet; Refill: 1   Labs pending Health Maintenance reviewed Diet and exercise encouraged  Follow up plan: 3 months   Mary-Margaret Gladis, FNP

## 2023-10-30 NOTE — Patient Instructions (Signed)

## 2023-10-31 LAB — CBC WITH DIFFERENTIAL/PLATELET
Basophils Absolute: 0.1 x10E3/uL (ref 0.0–0.2)
Basos: 1 %
EOS (ABSOLUTE): 0.2 x10E3/uL (ref 0.0–0.4)
Eos: 2 %
Hematocrit: 51.4 % — ABNORMAL HIGH (ref 37.5–51.0)
Hemoglobin: 17 g/dL (ref 13.0–17.7)
Immature Grans (Abs): 0 x10E3/uL (ref 0.0–0.1)
Immature Granulocytes: 0 %
Lymphocytes Absolute: 2.8 x10E3/uL (ref 0.7–3.1)
Lymphs: 24 %
MCH: 30.9 pg (ref 26.6–33.0)
MCHC: 33.1 g/dL (ref 31.5–35.7)
MCV: 93 fL (ref 79–97)
Monocytes Absolute: 0.6 x10E3/uL (ref 0.1–0.9)
Monocytes: 5 %
Neutrophils Absolute: 8.1 x10E3/uL — ABNORMAL HIGH (ref 1.4–7.0)
Neutrophils: 68 %
Platelets: 279 x10E3/uL (ref 150–450)
RBC: 5.51 x10E6/uL (ref 4.14–5.80)
RDW: 12.5 % (ref 11.6–15.4)
WBC: 11.8 x10E3/uL — ABNORMAL HIGH (ref 3.4–10.8)

## 2023-10-31 LAB — CMP14+EGFR
ALT: 24 IU/L (ref 0–44)
AST: 16 IU/L (ref 0–40)
Albumin: 4.5 g/dL (ref 4.1–5.1)
Alkaline Phosphatase: 79 IU/L (ref 47–123)
BUN/Creatinine Ratio: 15 (ref 9–20)
BUN: 14 mg/dL (ref 6–24)
Bilirubin Total: 0.5 mg/dL (ref 0.0–1.2)
CO2: 23 mmol/L (ref 20–29)
Calcium: 9.8 mg/dL (ref 8.7–10.2)
Chloride: 100 mmol/L (ref 96–106)
Creatinine, Ser: 0.91 mg/dL (ref 0.76–1.27)
Globulin, Total: 2.6 g/dL (ref 1.5–4.5)
Glucose: 151 mg/dL — ABNORMAL HIGH (ref 70–99)
Potassium: 4.3 mmol/L (ref 3.5–5.2)
Sodium: 140 mmol/L (ref 134–144)
Total Protein: 7.1 g/dL (ref 6.0–8.5)
eGFR: 104 mL/min/1.73 (ref >59)

## 2023-10-31 LAB — LIPID PANEL
Chol/HDL Ratio: 4.1 ratio (ref 0.0–5.0)
Cholesterol, Total: 161 mg/dL (ref 100–199)
HDL: 39 mg/dL — ABNORMAL LOW (ref >39)
LDL Chol Calc (NIH): 104 mg/dL — ABNORMAL HIGH (ref 0–99)
Triglycerides: 97 mg/dL (ref 0–149)
VLDL Cholesterol Cal: 18 mg/dL (ref 5–40)

## 2023-10-31 MED ORDER — ATORVASTATIN CALCIUM 40 MG PO TABS: 40.0000 mg | ORAL_TABLET | Freq: Every day | ORAL | 1 refills | Status: AC

## 2023-11-03 ENCOUNTER — Telehealth: Payer: Self-pay

## 2023-11-03 DIAGNOSIS — Z7189 Other specified counseling: Secondary | ICD-10-CM

## 2023-11-03 LAB — HGB A1C W/O EAG: Hgb A1c MFr Bld: 8.9 % — ABNORMAL HIGH (ref 4.8–5.6)

## 2023-11-03 LAB — SPECIMEN STATUS REPORT

## 2023-11-03 NOTE — Progress Notes (Signed)
 Complex Care Management Note  Care Guide Note 11/03/2023 Name: WINTON OFFORD MRN: [id number] DOB: 08/09/1975  MAKAIO MACH is a 48 y.o. year old male who sees Gladis Mustard, FNP for primary care. I reached out to Lonni JINNY Salmon by phone today to offer complex care management services.  Mr. Cornelio was given information about Complex Care Management services today including:   The Complex Care Management services include support from the care team which includes your Nurse Care Manager, Clinical Social Worker, or Pharmacist.  The Complex Care Management team is here to help remove barriers to the health concerns and goals most important to you. Complex Care Management services are voluntary, and the patient may decline or stop services at any time by request to their care team member.   Complex Care Management Consent Status: Patient did not agree to participate in complex care management services at this time.  Follow up plan:    Encounter Outcome:  Patient Refused  Jeoffrey Buffalo , RMA     [facility]  [facility], [facility] Guide  Direct Dial: [phone]  Website: [email or web address]

## 2023-11-13 ENCOUNTER — Other Ambulatory Visit: Payer: Self-pay | Admitting: *Deleted

## 2023-11-13 DIAGNOSIS — J302 Other seasonal allergic rhinitis: Secondary | ICD-10-CM

## 2023-11-13 MED ORDER — CETIRIZINE HCL 10 MG PO TABS: 10.0000 mg | ORAL_TABLET | Freq: Every day | ORAL | 1 refills | Status: AC

## 2023-12-05 ENCOUNTER — Ambulatory Visit: Admitting: Podiatry

## 2023-12-05 DIAGNOSIS — M216X1 Other acquired deformities of right foot: Secondary | ICD-10-CM | POA: Diagnosis not present

## 2023-12-05 DIAGNOSIS — L97512 Non-pressure chronic ulcer of other part of right foot with fat layer exposed: Secondary | ICD-10-CM

## 2023-12-05 DIAGNOSIS — M216X2 Other acquired deformities of left foot: Secondary | ICD-10-CM | POA: Diagnosis not present

## 2023-12-05 NOTE — Progress Notes (Unsigned)
 Subjective:  Patient ID: Oscar Cox, male    DOB: 05/13/1975,  MRN: [id number]  Chief Complaint  Patient presents with   Callouses    48 y.o. male presents with the above complaint.  Patient presents with right hallux ulceration with fat layer exposed he wanted to get it evaluated he has not seen me in quite some time he states that this came out of nowhere.  Pain scale 7 out of 10 dull aching nature.  He is a diabetic he also does not want an oral products he would like to discuss treatment options for this as well.  Denies any other acute issues.   Review of Systems: Negative except as noted in the HPI. Denies N/V/F/Ch.  Past Medical History:  Diagnosis Date   Acid reflux    Diabetes mellitus without complication (HCC)    Essential hypertension, benign 11/27/2016   Hepatitis    Hepatitis C virus infection cured after antiviral drug therapy 09/24/2018   High cholesterol 11/27/2016    Current Outpatient Medications:    atorvastatin (LIPITOR) 40 MG tablet, Take 1 tablet (40 mg total) by mouth daily., Disp: 90 tablet, Rfl: 1   cetirizine  (EQ ALLERGY  RELIEF, CETIRIZINE ,) 10 MG tablet, Take 1 tablet (10 mg total) by mouth daily., Disp: 90 tablet, Rfl: 1   Empagliflozin -linaGLIPtin (GLYXAMBI ) 25-5 MG TABS, Take 1 tablet by mouth daily., Disp: 90 tablet, Rfl: 1   fluticasone  (FLONASE ) 50 MCG/ACT nasal spray, Use 2 spray(s) in each nostril once daily, Disp: 48 g, Rfl: 0   lisinopril  (ZESTRIL ) 2.5 MG tablet, Take 1 tablet (2.5 mg total) by mouth daily., Disp: 90 tablet, Rfl: 1   metoprolol  tartrate (LOPRESSOR ) 25 MG tablet, Take 25 mg by mouth daily for heart rate over 100 or palpitations, Disp: 90 tablet, Rfl: 1   montelukast  (SINGULAIR ) 10 MG tablet, TAKE 1 TABLET BY MOUTH AT BEDTIME, Disp: 90 tablet, Rfl: 1   omeprazole  (PRILOSEC ) 20 MG capsule, Take 1 capsule (20 mg total) by mouth daily., Disp: 90 capsule, Rfl: 1   pravastatin  (PRAVACHOL ) 40 MG tablet, Take 1 tablet (40 mg  total) by mouth daily., Disp: 90 tablet, Rfl: 0  Social History   Tobacco Use  Smoking Status Every Day   Current packs/day: 2.00   Types: Cigarettes   Passive exposure: Never  Smokeless Tobacco Never    Allergies  Allergen Reactions   Tamiflu [Oseltamivir Phosphate] Hives   Metformin  And Related Diarrhea   Morphine  Nausea Only   Potassium Chloride Nausea And Vomiting   Objective:  There were no vitals filed for this visit. There is no height or weight on file to calculate BMI. Constitutional Well developed. Well nourished.  Vascular Dorsalis pedis pulses palpable bilaterally. Posterior tibial pulses palpable bilaterally. Capillary refill normal to all digits.  No cyanosis or clubbing noted. Pedal hair growth normal.  Neurologic Normal speech. Oriented to person, place, and time. Epicritic sensation to light touch grossly present bilaterally.  Dermatologic Right hallux ulceration with fat layer exposed does not probe down to deep tissue or bone.  No purulent drainage noted no malodor present no erythema noted.  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  Hallux unable to perform single and double heel raise   Radiographs: None Assessment:   1. Other acquired deformities of left foot   2. Other acquired deformities of right foot   3. Chronic toe ulcer, right, with fat layer exposed (HCC)    Plan:  Patient  was evaluated and treated and all questions answered.  Right hallux ulceration fat layer exposed - All questions and concerns were discussed with the patient in extensive detail given the amount of pain that he is experiencing he will benefit Betadine wet-to-dry dressing changes daily to encourage him to wear with better shoes.  I also discussed with them to continue with Betadine every single day she states understanding  Pes planovalgus/foot deformity -I explained to patient the etiology of pes planovalgus  and relationship with heel pain/arch pain and various treatment options were discussed.  Given patient foot structure in the setting of heel pain/arch pain I believe patient will benefit from custom-made orthotics to help control the hindfoot motion support the arch of the foot and take the stress away from arches.  Patient agrees with the plan like to proceed with orthotics -Patient was casted for orthotics   No follow-ups on file.

## 2023-12-19 ENCOUNTER — Inpatient Hospital Stay

## 2023-12-26 ENCOUNTER — Inpatient Hospital Stay: Admitting: Oncology

## 2023-12-26 ENCOUNTER — Ambulatory Visit: Admitting: Podiatry

## 2023-12-26 DIAGNOSIS — L97512 Non-pressure chronic ulcer of other part of right foot with fat layer exposed: Secondary | ICD-10-CM | POA: Diagnosis not present

## 2023-12-26 NOTE — Progress Notes (Signed)
 Subjective:  Patient ID: Oscar Cox, male    DOB: Feb 27, 1975,  MRN: [id number]  Chief Complaint  Patient presents with   Foot Ulcer    48 y.o. male presents with the above complaint.  Patient presents for right hallux ulceration he states is doing a lot better has been doing iodine denies any other acute complaints   Review of Systems: Negative except as noted in the HPI. Denies N/V/F/Ch.  Past Medical History:  Diagnosis Date   Acid reflux    Diabetes mellitus without complication (HCC)    Essential hypertension, benign 11/27/2016   Hepatitis    Hepatitis C virus infection cured after antiviral drug therapy 09/24/2018   High cholesterol 11/27/2016    Current Outpatient Medications:    atorvastatin (LIPITOR) 40 MG tablet, Take 1 tablet (40 mg total) by mouth daily., Disp: 90 tablet, Rfl: 1   cetirizine  (EQ ALLERGY  RELIEF, CETIRIZINE ,) 10 MG tablet, Take 1 tablet (10 mg total) by mouth daily., Disp: 90 tablet, Rfl: 1   Empagliflozin -linaGLIPtin (GLYXAMBI ) 25-5 MG TABS, Take 1 tablet by mouth daily., Disp: 90 tablet, Rfl: 1   fluticasone  (FLONASE ) 50 MCG/ACT nasal spray, Use 2 spray(s) in each nostril once daily, Disp: 48 g, Rfl: 0   lisinopril  (ZESTRIL ) 2.5 MG tablet, Take 1 tablet (2.5 mg total) by mouth daily., Disp: 90 tablet, Rfl: 1   metoprolol  tartrate (LOPRESSOR ) 25 MG tablet, Take 25 mg by mouth daily for heart rate over 100 or palpitations, Disp: 90 tablet, Rfl: 1   montelukast  (SINGULAIR ) 10 MG tablet, TAKE 1 TABLET BY MOUTH AT BEDTIME, Disp: 90 tablet, Rfl: 1   omeprazole  (PRILOSEC ) 20 MG capsule, Take 1 capsule (20 mg total) by mouth daily., Disp: 90 capsule, Rfl: 1   pravastatin  (PRAVACHOL ) 40 MG tablet, Take 1 tablet (40 mg total) by mouth daily., Disp: 90 tablet, Rfl: 0  Social History   Tobacco Use  Smoking Status Every Day   Current packs/day: 2.00   Types: Cigarettes   Passive exposure: Never  Smokeless Tobacco Never    Allergies  Allergen  Reactions   Tamiflu [Oseltamivir Phosphate] Hives   Metformin  And Related Diarrhea   Morphine  Nausea Only   Potassium Chloride Nausea And Vomiting   Objective:  There were no vitals filed for this visit. There is no height or weight on file to calculate BMI. Constitutional Well developed. Well nourished.  Vascular Dorsalis pedis pulses palpable bilaterally. Posterior tibial pulses palpable bilaterally. Capillary refill normal to all digits.  No cyanosis or clubbing noted. Pedal hair growth normal.  Neurologic Normal speech. Oriented to person, place, and time. Epicritic sensation to light touch grossly present bilaterally.  Dermatologic Right hallux ulceration with fat layer exposed does not probe down to deep tissue or bone.  No purulent drainage noted no malodor present no erythema noted.  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  Hallux unable to perform single and double heel raise   Radiographs: None Assessment:   1. Chronic toe ulcer, right, with fat layer exposed (HCC)     Plan:  Patient was evaluated and treated and all questions answered.  Right hallux ulceration fat layer exposed - All questions and concerns were discussed with the patient in extensive detail continue Betadine wet-to-dry dressing changes.  The wound is decreasing in size.  Will continue to clinically monitor.  Pes planovalgus/foot deformity -I explained to patient the etiology of pes planovalgus and relationship with heel pain/arch pain and various  treatment options were discussed.  Given patient foot structure in the setting of heel pain/arch pain I believe patient will benefit from custom-made orthotics to help control the hindfoot motion support the arch of the foot and take the stress away from arches.  Patient agrees with the plan like to proceed with orthotics - Patient is awaiting orthotics   No follow-ups on file.

## 2024-01-12 ENCOUNTER — Encounter: Payer: Self-pay | Admitting: *Deleted

## 2024-01-16 ENCOUNTER — Ambulatory Visit: Admitting: Podiatry

## 2024-01-16 DIAGNOSIS — L97512 Non-pressure chronic ulcer of other part of right foot with fat layer exposed: Secondary | ICD-10-CM

## 2024-01-16 NOTE — Progress Notes (Signed)
 "  Subjective:  Patient ID: Oscar Cox, male    DOB: 1975/08/22,  MRN: [id number]  Chief Complaint  Patient presents with   Foot Ulcer    49 y.o. male presents with the above complaint.  Patient presents for right hallux ulceration he states is doing a lot better has been doing iodine denies any other acute complaints   Review of Systems: Negative except as noted in the HPI. Denies N/V/F/Ch.  Past Medical History:  Diagnosis Date   Acid reflux    Diabetes mellitus without complication (HCC)    Essential hypertension, benign 11/27/2016   Hepatitis    Hepatitis C virus infection cured after antiviral drug therapy 09/24/2018   High cholesterol 11/27/2016    Current Outpatient Medications:    atorvastatin  (LIPITOR) 40 MG tablet, Take 1 tablet (40 mg total) by mouth daily., Disp: 90 tablet, Rfl: 1   cetirizine  (EQ ALLERGY RELIEF, CETIRIZINE ,) 10 MG tablet, Take 1 tablet (10 mg total) by mouth daily., Disp: 90 tablet, Rfl: 1   Empagliflozin -linaGLIPtin (GLYXAMBI ) 25-5 MG TABS, Take 1 tablet by mouth daily., Disp: 90 tablet, Rfl: 1   fluticasone  (FLONASE ) 50 MCG/ACT nasal spray, Use 2 spray(s) in each nostril once daily, Disp: 48 g, Rfl: 0   lisinopril  (ZESTRIL ) 2.5 MG tablet, Take 1 tablet (2.5 mg total) by mouth daily., Disp: 90 tablet, Rfl: 1   metoprolol  tartrate (LOPRESSOR ) 25 MG tablet, Take 25 mg by mouth daily for heart rate over 100 or palpitations, Disp: 90 tablet, Rfl: 1   montelukast  (SINGULAIR ) 10 MG tablet, TAKE 1 TABLET BY MOUTH AT BEDTIME, Disp: 90 tablet, Rfl: 1   omeprazole  (PRILOSEC ) 20 MG capsule, Take 1 capsule (20 mg total) by mouth daily., Disp: 90 capsule, Rfl: 1   pravastatin  (PRAVACHOL ) 40 MG tablet, Take 1 tablet (40 mg total) by mouth daily., Disp: 90 tablet, Rfl: 0  Social History   Tobacco Use  Smoking Status Every Day   Current packs/day: 2.00   Types: Cigarettes   Passive exposure: Never  Smokeless Tobacco Never    Allergies  Allergen  Reactions   Tamiflu [Oseltamivir Phosphate] Hives   Metformin  And Related Diarrhea   Morphine  Nausea Only   Potassium Chloride Nausea And Vomiting   Objective:  There were no vitals filed for this visit. There is no height or weight on file to calculate BMI. Constitutional Well developed. Well nourished.  Vascular Dorsalis pedis pulses palpable bilaterally. Posterior tibial pulses palpable bilaterally. Capillary refill normal to all digits.  No cyanosis or clubbing noted. Pedal hair growth normal.  Neurologic Normal speech. Oriented to person, place, and time. Epicritic sensation to light touch grossly present bilaterally.  Dermatologic Right hallux ulceration completely reepithelialized.  No further breakdown noted.  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  Hallux unable to perform single and double heel raise   Radiographs: None Assessment:   1. Chronic toe ulcer, right, with fat layer exposed (HCC)      Plan:  Patient was evaluated and treated and all questions answered.  Right hallux ulceration fat layer exposed - Clinically healed and officially discharged from my care if any foot and ankle issues on future he will come back and see me.  I discussed shoe gear modification orthotics management he states understanding  Pes planovalgus/foot deformity -I explained to patient the etiology of pes planovalgus and relationship with heel pain/arch pain and various treatment options were discussed.  Given patient foot structure in the setting of heel pain/arch pain I believe patient will benefit from custom-made orthotics to help control the hindfoot motion support the arch of the foot and take the stress away from arches.  Patient agrees with the plan like to proceed with orthotics - Orthotics are dispensed and functioning well no acute complaints.   No follow-ups on file. "

## 2024-01-26 ENCOUNTER — Ambulatory Visit: Payer: Self-pay | Admitting: Nurse Practitioner

## 2024-01-26 ENCOUNTER — Encounter: Payer: Self-pay | Admitting: Nurse Practitioner

## 2024-01-26 VITALS — BP 136/96 | HR 70 | Temp 97.6°F | Ht 74.0 in | Wt 202.0 lb

## 2024-01-26 DIAGNOSIS — K219 Gastro-esophageal reflux disease without esophagitis: Secondary | ICD-10-CM | POA: Diagnosis not present

## 2024-01-26 DIAGNOSIS — Z125 Encounter for screening for malignant neoplasm of prostate: Secondary | ICD-10-CM

## 2024-01-26 DIAGNOSIS — E1159 Type 2 diabetes mellitus with other circulatory complications: Secondary | ICD-10-CM

## 2024-01-26 DIAGNOSIS — E559 Vitamin D deficiency, unspecified: Secondary | ICD-10-CM

## 2024-01-26 DIAGNOSIS — E785 Hyperlipidemia, unspecified: Secondary | ICD-10-CM

## 2024-01-26 DIAGNOSIS — I152 Hypertension secondary to endocrine disorders: Secondary | ICD-10-CM | POA: Diagnosis not present

## 2024-01-26 DIAGNOSIS — E1169 Type 2 diabetes mellitus with other specified complication: Secondary | ICD-10-CM | POA: Diagnosis not present

## 2024-01-26 DIAGNOSIS — E1140 Type 2 diabetes mellitus with diabetic neuropathy, unspecified: Secondary | ICD-10-CM

## 2024-01-26 LAB — LIPID PANEL

## 2024-01-26 LAB — BAYER DCA HB A1C WAIVED: HB A1C (BAYER DCA - WAIVED): 7.6 % — ABNORMAL HIGH (ref 4.8–5.6)

## 2024-01-26 NOTE — Progress Notes (Signed)
 Subjective:    Patient ID: Oscar Cox, male    DOB: [birth year], 49 y.o.   MRN: [id number]   Chief Complaint: medical management of chronic issues     HPI:  Oscar Cox is a 49 y.o. who identifies as a male who was assigned male at birth.   Social history: Lives with: mom Work history: current;y not employeed   Comes in today for follow up of the following chronic medical issues:  1. Hypertension associated with type 2 diabetes mellitus (HCC) No c/o chest pain, sob or headache. Doe snot check blood pressure at home. BP Readings from Last 3 Encounters:  10/30/23 117/81  09/11/23 (!) 142/80  07/14/23 128/87     2. Hyperlipidemia associated with type 2 diabetes mellitus (HCC) Does not watch diet and does very little exercise. We started him on lipitor at last visit. This SmartLink has not been configured with any valid records.   Lab Results  Component Value Date   CHOL 161 10/30/2023   HDL 39 (L) 10/30/2023   LDLCALC 104 (H) 10/30/2023   TRIG 97 10/30/2023   CHOLHDL 4.1 10/30/2023   The 10-year ASCVD risk score (Arnett DK, et al., 2019) is: 12%   3. Type 2 diabetes mellitus with diabetic neuropathy, without long-term current use of insulin  (HCC) Very seldom checks blood sugars at home. Not very compliant with his diet. No changes made at last visit Lab Results  Component Value Date   HGBA1C 8.9 (H) 10/30/2023     4. Vitamin D  deficiency Is on a weekly vitamin d  supplement and is getting ready to start on daiy dose of 2000Iu soon.  5. BMI 25.0-25.9 Wt Readings from Last 3 Encounters:  01/26/24 202 lb (91.6 kg)  10/30/23 199 lb (90.3 kg)  09/11/23 203 lb 3.2 oz (92.2 kg)   BMI Readings from Last 3 Encounters:  01/26/24 25.94 kg/m  10/30/23 25.55 kg/m  09/11/23 26.09 kg/m      New complaints: None today  Allergies  Allergen Reactions   Tamiflu [Oseltamivir Phosphate] Hives   Metformin  And Related Diarrhea   Morphine  Nausea Only    Potassium Chloride Nausea And Vomiting   Outpatient Encounter Medications as of 01/26/2024  Medication Sig   atorvastatin  (LIPITOR) 40 MG tablet Take 1 tablet (40 mg total) by mouth daily.   cetirizine  (EQ ALLERGY RELIEF, CETIRIZINE ,) 10 MG tablet Take 1 tablet (10 mg total) by mouth daily.   Empagliflozin -linaGLIPtin (GLYXAMBI ) 25-5 MG TABS Take 1 tablet by mouth daily.   fluticasone  (FLONASE ) 50 MCG/ACT nasal spray Use 2 spray(s) in each nostril once daily   lisinopril  (ZESTRIL ) 2.5 MG tablet Take 1 tablet (2.5 mg total) by mouth daily.   metoprolol  tartrate (LOPRESSOR ) 25 MG tablet Take 25 mg by mouth daily for heart rate over 100 or palpitations   montelukast  (SINGULAIR ) 10 MG tablet TAKE 1 TABLET BY MOUTH AT BEDTIME   omeprazole  (PRILOSEC ) 20 MG capsule Take 1 capsule (20 mg total) by mouth daily.   pravastatin  (PRAVACHOL ) 40 MG tablet Take 1 tablet (40 mg total) by mouth daily.   No facility-administered encounter medications on file as of 01/26/2024.    Past Surgical History:  Procedure Laterality Date   ANTERIOR CERVICAL DECOMP/DISCECTOMY FUSION N/A 01/21/2023   Procedure: Anterior Cervical Discectomy and Fusion Cervical five-six, Cervical six-seven;  Surgeon: Louis Shove, MD;  Location: [facility] OR;  Service: Neurosurgery;  Laterality: N/A;   COLONOSCOPY WITH PROPOFOL  N/A 03/21/2020   Castaneda:normal ileum, diverticulosis in  sigmoid colon, descending colon and ascending colon. Non bleeding internal hemorrhoids, no specimens collected.   FOOT SURGERY Left    FOOT SURGERY Right     Family History  Problem Relation Age of Onset   Hypertension Mother    Breast cancer Mother    Diabetes Father    Heart disease Father    Hypertension Father    Colon cancer Father    Diabetes Brother    Hypertension Brother    Thyroid  cancer Brother        Anaplastic      Controlled substance contract: n/a     Review of Systems  Constitutional:  Negative for diaphoresis.  Eyes:  Negative  for pain.  Respiratory:  Negative for shortness of breath.   Cardiovascular:  Negative for chest pain, palpitations and leg swelling.  Gastrointestinal:  Negative for abdominal pain.  Endocrine: Negative for polydipsia.  Skin:  Negative for rash.  Neurological:  Negative for dizziness, weakness and headaches.  Hematological:  Does not bruise/bleed easily.  All other systems reviewed and are negative.      Objective:   Physical Exam Vitals and nursing note reviewed.  Constitutional:      Appearance: Normal appearance. He is well-developed.  HENT:     Head: Normocephalic.     Nose: Nose normal.     Mouth/Throat:     Mouth: Mucous membranes are moist.     Pharynx: Oropharynx is clear.  Eyes:     Pupils: Pupils are equal, round, and reactive to light.  Neck:     Thyroid : No thyroid  mass or thyromegaly.     Vascular: No carotid bruit or JVD.     Trachea: Phonation normal.  Cardiovascular:     Rate and Rhythm: Normal rate and regular rhythm.  Pulmonary:     Effort: Pulmonary effort is normal. No respiratory distress.     Breath sounds: Normal breath sounds.  Abdominal:     General: Bowel sounds are normal.     Palpations: Abdomen is soft.     Tenderness: There is no abdominal tenderness.  Musculoskeletal:        General: Normal range of motion.     Cervical back: Normal range of motion and neck supple.  Lymphadenopathy:     Cervical: No cervical adenopathy.  Skin:    General: Skin is warm and dry.     Comments: Black  hard crusty area on medial side of right great toe.- no drainage- no erythema  Neurological:     Mental Status: He is alert and oriented to person, place, and time.  Psychiatric:        Behavior: Behavior normal.        Thought Content: Thought content normal.        Judgment: Judgment normal.     BP (!) 136/96   Pulse 70   Temp 97.6 F (36.4 C) (Temporal)   Ht 6' 2 (1.88 m)   Wt 202 lb (91.6 kg)   SpO2 97%   BMI 25.94 kg/m    hgba1c 7.6%      Oscar Cox comes in today with chief complaint of medical management of chronic issues    Diagnosis and orders addressed:  1. Hypertension associated with type 2 diabetes mellitus (HCC) (Primary) Low sodium diet - CBC with Differential/Platelet - CMP14+EGFR - lisinopril  (ZESTRIL ) 2.5 MG tablet; Take 1 tablet (2.5 mg total) by mouth daily.  Dispense: 90 tablet; Refill: 1  2. Hyperlipidemia associated with  type 2 diabetes mellitus (HCC) Low fat diet - Lipid panel - pravastatin  (PRAVACHOL ) 40 MG tablet; Take 1 tablet (40 mg total) by mouth daily.  Dispense: 90 tablet; Refill: 0  3. Type 2 diabetes mellitus with diabetic neuropathy, without long-term current use of insulin  (HCC) Stricter carb counting Continue current glyxambi  - Bayer DCA Hb A1c Waived - Empagliflozin -linaGLIPtin (GLYXAMBI ) 25-5 MG TABS; Take 1 tablet by mouth daily.  Dispense: 90 tablet; Refill: 1  4. Gastroesophageal reflux disease without esophagitis Avoid spicy foods Do not eat 2 hours prior to bedtime  - omeprazole  (PRILOSEC ) 20 MG capsule; Take 1 capsule (20 mg total) by mouth daily.  Dispense: 90 capsule; Refill: 1  5. Sinus tachycardia Avoid caffeine - metoprolol  tartrate (LOPRESSOR ) 25 MG tablet; Take 25 mg by mouth daily for heart rate over 100 or palpitations  Dispense: 90 tablet; Refill: 1   Labs pending Health Maintenance reviewed Diet and exercise encouraged  Follow up plan: 3 months   Mary-Margaret Gladis, FNP

## 2024-01-26 NOTE — Patient Instructions (Signed)

## 2024-01-27 ENCOUNTER — Ambulatory Visit: Payer: Self-pay | Admitting: Nurse Practitioner

## 2024-01-27 LAB — CBC WITH DIFFERENTIAL/PLATELET
Basophils Absolute: 0.1 x10E3/uL (ref 0.0–0.2)
Basos: 0 %
EOS (ABSOLUTE): 0.3 x10E3/uL (ref 0.0–0.4)
Eos: 2 %
Hematocrit: 51.4 % — ABNORMAL HIGH (ref 37.5–51.0)
Hemoglobin: 16.6 g/dL (ref 13.0–17.7)
Immature Grans (Abs): 0 x10E3/uL (ref 0.0–0.1)
Immature Granulocytes: 0 %
Lymphocytes Absolute: 3.4 x10E3/uL — ABNORMAL HIGH (ref 0.7–3.1)
Lymphs: 27 %
MCH: 29.7 pg (ref 26.6–33.0)
MCHC: 32.3 g/dL (ref 31.5–35.7)
MCV: 92 fL (ref 79–97)
Monocytes Absolute: 0.8 x10E3/uL (ref 0.1–0.9)
Monocytes: 7 %
Neutrophils Absolute: 7.7 x10E3/uL — ABNORMAL HIGH (ref 1.4–7.0)
Neutrophils: 64 %
Platelets: 237 x10E3/uL (ref 150–450)
RBC: 5.59 x10E6/uL (ref 4.14–5.80)
RDW: 12.5 % (ref 11.6–15.4)
WBC: 12.2 x10E3/uL — ABNORMAL HIGH (ref 3.4–10.8)

## 2024-01-27 LAB — CMP14+EGFR
ALT: 23 IU/L (ref 0–44)
AST: 14 IU/L (ref 0–40)
Albumin: 4.5 g/dL (ref 4.1–5.1)
Alkaline Phosphatase: 74 IU/L (ref 47–123)
BUN/Creatinine Ratio: 15 (ref 9–20)
BUN: 14 mg/dL (ref 6–24)
Bilirubin Total: 0.4 mg/dL (ref 0.0–1.2)
CO2: 24 mmol/L (ref 20–29)
Calcium: 9.5 mg/dL (ref 8.7–10.2)
Chloride: 100 mmol/L (ref 96–106)
Creatinine, Ser: 0.95 mg/dL (ref 0.76–1.27)
Globulin, Total: 2.2 g/dL (ref 1.5–4.5)
Glucose: 183 mg/dL — AB (ref 70–99)
Potassium: 4.2 mmol/L (ref 3.5–5.2)
Sodium: 140 mmol/L (ref 134–144)
Total Protein: 6.7 g/dL (ref 6.0–8.5)
eGFR: 99 mL/min/1.73

## 2024-01-27 LAB — LIPID PANEL
Cholesterol, Total: 148 mg/dL (ref 100–199)
HDL: 36 mg/dL — AB
LDL CALC COMMENT:: 4.1 ratio (ref 0.0–5.0)
LDL Chol Calc (NIH): 80 mg/dL (ref 0–99)
Triglycerides: 190 mg/dL — AB (ref 0–149)
VLDL Cholesterol Cal: 32 mg/dL (ref 5–40)

## 2024-01-27 LAB — PSA, TOTAL AND FREE
PSA, Free Pct: 20 %
PSA, Free: 0.12 ng/mL
Prostate Specific Ag, Serum: 0.6 ng/mL (ref 0.0–4.0)

## 2024-01-28 ENCOUNTER — Other Ambulatory Visit: Payer: Self-pay | Admitting: Nurse Practitioner

## 2024-01-28 DIAGNOSIS — J302 Other seasonal allergic rhinitis: Secondary | ICD-10-CM

## 2024-04-22 ENCOUNTER — Ambulatory Visit: Admitting: Nurse Practitioner
